# Patient Record
Sex: Female | Born: 1935 | Race: White | Hispanic: No | Smoking: Former smoker
Health system: Southern US, Community
[De-identification: ages and names within clinical notes are randomized; demographics above are authoritative.]

## PROBLEM LIST (undated history)

## (undated) DIAGNOSIS — I255 Ischemic cardiomyopathy: Secondary | ICD-10-CM

## (undated) DIAGNOSIS — E119 Type 2 diabetes mellitus without complications: Secondary | ICD-10-CM

## (undated) DIAGNOSIS — I5023 Acute on chronic systolic (congestive) heart failure: Secondary | ICD-10-CM

## (undated) DIAGNOSIS — I214 Non-ST elevation (NSTEMI) myocardial infarction: Secondary | ICD-10-CM

## (undated) DIAGNOSIS — E079 Disorder of thyroid, unspecified: Secondary | ICD-10-CM

## (undated) DIAGNOSIS — I2510 Atherosclerotic heart disease of native coronary artery without angina pectoris: Secondary | ICD-10-CM

## (undated) DIAGNOSIS — D649 Anemia, unspecified: Secondary | ICD-10-CM

## (undated) DIAGNOSIS — I10 Essential (primary) hypertension: Secondary | ICD-10-CM

## (undated) DIAGNOSIS — G459 Transient cerebral ischemic attack, unspecified: Secondary | ICD-10-CM

## (undated) HISTORY — DX: Non-ST elevation (NSTEMI) myocardial infarction: I21.4

## (undated) HISTORY — DX: Ischemic cardiomyopathy: I25.5

## (undated) HISTORY — DX: Atherosclerotic heart disease of native coronary artery without angina pectoris: I25.10

## (undated) HISTORY — DX: Acute on chronic systolic (congestive) heart failure: I50.23

---

## 2004-11-23 ENCOUNTER — Ambulatory Visit: Payer: Self-pay | Admitting: Internal Medicine

## 2005-01-24 ENCOUNTER — Ambulatory Visit: Payer: Self-pay | Admitting: Internal Medicine

## 2005-06-08 ENCOUNTER — Ambulatory Visit: Payer: Self-pay | Admitting: Internal Medicine

## 2006-01-24 ENCOUNTER — Ambulatory Visit: Payer: Self-pay | Admitting: Internal Medicine

## 2006-03-27 ENCOUNTER — Ambulatory Visit (HOSPITAL_COMMUNITY): Admission: RE | Admit: 2006-03-27 | Discharge: 2006-03-27 | Payer: Self-pay | Admitting: *Deleted

## 2006-04-10 ENCOUNTER — Ambulatory Visit: Payer: Self-pay | Admitting: *Deleted

## 2006-06-18 ENCOUNTER — Ambulatory Visit: Payer: Self-pay | Admitting: Physician Assistant

## 2007-01-28 ENCOUNTER — Ambulatory Visit: Payer: Self-pay | Admitting: Internal Medicine

## 2007-03-28 ENCOUNTER — Other Ambulatory Visit: Payer: Self-pay

## 2007-03-28 ENCOUNTER — Emergency Department: Payer: Self-pay | Admitting: Emergency Medicine

## 2008-01-29 ENCOUNTER — Ambulatory Visit: Payer: Self-pay | Admitting: Internal Medicine

## 2008-09-17 DIAGNOSIS — G459 Transient cerebral ischemic attack, unspecified: Secondary | ICD-10-CM

## 2008-09-17 HISTORY — DX: Transient cerebral ischemic attack, unspecified: G45.9

## 2009-03-02 ENCOUNTER — Ambulatory Visit: Payer: Self-pay | Admitting: Internal Medicine

## 2009-04-01 ENCOUNTER — Ambulatory Visit: Payer: Self-pay | Admitting: Unknown Physician Specialty

## 2010-01-10 ENCOUNTER — Inpatient Hospital Stay: Payer: Self-pay | Admitting: Internal Medicine

## 2010-01-16 ENCOUNTER — Emergency Department: Payer: Self-pay | Admitting: Emergency Medicine

## 2010-03-08 ENCOUNTER — Ambulatory Visit: Payer: Self-pay | Admitting: Internal Medicine

## 2011-03-13 ENCOUNTER — Ambulatory Visit: Payer: Self-pay | Admitting: Internal Medicine

## 2011-08-11 ENCOUNTER — Inpatient Hospital Stay: Payer: Self-pay | Admitting: Internal Medicine

## 2014-03-15 ENCOUNTER — Emergency Department: Payer: Self-pay | Admitting: Emergency Medicine

## 2014-04-17 DIAGNOSIS — I2510 Atherosclerotic heart disease of native coronary artery without angina pectoris: Secondary | ICD-10-CM

## 2014-04-17 HISTORY — DX: Atherosclerotic heart disease of native coronary artery without angina pectoris: I25.10

## 2014-05-02 ENCOUNTER — Inpatient Hospital Stay (HOSPITAL_COMMUNITY)
Admission: EM | Admit: 2014-05-02 | Discharge: 2014-05-05 | DRG: 246 | Disposition: A | Discharge status: Home Health | Payer: Medicare HMO | Attending: Cardiovascular Disease | Admitting: Cardiovascular Disease

## 2014-05-02 ENCOUNTER — Ambulatory Visit (HOSPITAL_COMMUNITY): Payer: Medicare HMO

## 2014-05-02 ENCOUNTER — Encounter (HOSPITAL_COMMUNITY): Payer: Self-pay | Admitting: Emergency Medicine

## 2014-05-02 DIAGNOSIS — E039 Hypothyroidism, unspecified: Secondary | ICD-10-CM | POA: Diagnosis present

## 2014-05-02 DIAGNOSIS — D649 Anemia, unspecified: Secondary | ICD-10-CM

## 2014-05-02 DIAGNOSIS — I5021 Acute systolic (congestive) heart failure: Secondary | ICD-10-CM | POA: Diagnosis present

## 2014-05-02 DIAGNOSIS — I501 Left ventricular failure: Secondary | ICD-10-CM | POA: Diagnosis present

## 2014-05-02 DIAGNOSIS — I509 Heart failure, unspecified: Secondary | ICD-10-CM | POA: Diagnosis present

## 2014-05-02 DIAGNOSIS — E119 Type 2 diabetes mellitus without complications: Secondary | ICD-10-CM | POA: Diagnosis present

## 2014-05-02 DIAGNOSIS — J811 Chronic pulmonary edema: Secondary | ICD-10-CM

## 2014-05-02 DIAGNOSIS — I2589 Other forms of chronic ischemic heart disease: Secondary | ICD-10-CM | POA: Diagnosis present

## 2014-05-02 DIAGNOSIS — I214 Non-ST elevation (NSTEMI) myocardial infarction: Secondary | ICD-10-CM | POA: Diagnosis present

## 2014-05-02 DIAGNOSIS — R079 Chest pain, unspecified: Secondary | ICD-10-CM | POA: Diagnosis present

## 2014-05-02 DIAGNOSIS — J9600 Acute respiratory failure, unspecified whether with hypoxia or hypercapnia: Secondary | ICD-10-CM | POA: Diagnosis present

## 2014-05-02 DIAGNOSIS — I10 Essential (primary) hypertension: Secondary | ICD-10-CM | POA: Diagnosis present

## 2014-05-02 DIAGNOSIS — J9601 Acute respiratory failure with hypoxia: Secondary | ICD-10-CM

## 2014-05-02 DIAGNOSIS — I2510 Atherosclerotic heart disease of native coronary artery without angina pectoris: Secondary | ICD-10-CM | POA: Diagnosis present

## 2014-05-02 DIAGNOSIS — M479 Spondylosis, unspecified: Secondary | ICD-10-CM | POA: Diagnosis present

## 2014-05-02 DIAGNOSIS — I059 Rheumatic mitral valve disease, unspecified: Secondary | ICD-10-CM

## 2014-05-02 DIAGNOSIS — Z955 Presence of coronary angioplasty implant and graft: Secondary | ICD-10-CM

## 2014-05-02 HISTORY — DX: Essential (primary) hypertension: I10

## 2014-05-02 HISTORY — DX: Anemia, unspecified: D64.9

## 2014-05-02 HISTORY — DX: Disorder of thyroid, unspecified: E07.9

## 2014-05-02 HISTORY — DX: Transient cerebral ischemic attack, unspecified: G45.9

## 2014-05-02 HISTORY — DX: Type 2 diabetes mellitus without complications: E11.9

## 2014-05-02 LAB — CBC
HCT: 37.6 % (ref 36.0–46.0)
HEMATOCRIT: 38.8 % (ref 36.0–46.0)
Hemoglobin: 12.4 g/dL (ref 12.0–15.0)
Hemoglobin: 11.6 g/dL — ABNORMAL LOW (ref 12.0–15.0)
MCH: 29 pg (ref 26.0–34.0)
MCH: 28.6 pg (ref 26.0–34.0)
MCHC: 32 g/dL (ref 30.0–36.0)
MCHC: 30.9 g/dL (ref 30.0–36.0)
MCV: 90.9 fL (ref 78.0–100.0)
MCV: 92.6 fL (ref 78.0–100.0)
PLATELETS: 353 10*3/uL (ref 150–400)
Platelets: 294 10*3/uL (ref 150–400)
RBC: 4.27 MIL/uL (ref 3.87–5.11)
RBC: 4.06 MIL/uL (ref 3.87–5.11)
RDW: 15 % (ref 11.5–15.5)
RDW: 15 % (ref 11.5–15.5)
WBC: 19.7 10*3/uL — ABNORMAL HIGH (ref 4.0–10.5)
WBC: 11.9 10*3/uL — ABNORMAL HIGH (ref 4.0–10.5)

## 2014-05-02 LAB — BASIC METABOLIC PANEL
Anion gap: 13 (ref 5–15)
Anion gap: 16 — ABNORMAL HIGH (ref 5–15)
BUN: 20 mg/dL (ref 6–23)
BUN: 19 mg/dL (ref 6–23)
CALCIUM: 9.8 mg/dL (ref 8.4–10.5)
CO2: 22 meq/L (ref 19–32)
CO2: 25 mEq/L (ref 19–32)
Calcium: 9.7 mg/dL (ref 8.4–10.5)
Chloride: 100 mEq/L (ref 96–112)
Chloride: 96 mEq/L (ref 96–112)
Creatinine, Ser: 1.04 mg/dL (ref 0.50–1.10)
Creatinine, Ser: 1.01 mg/dL (ref 0.50–1.10)
GFR calc Af Amer: 54 mL/min — ABNORMAL LOW (ref >90)
GFR calc non Af Amer: 45 mL/min — ABNORMAL LOW (ref >90)
GFR calc non Af Amer: 46 mL/min — ABNORMAL LOW (ref >90)
GFR, EST AFRICAN AMERICAN: 52 mL/min — AB (ref >90)
GLUCOSE: 216 mg/dL — AB (ref 70–99)
Glucose, Bld: 230 mg/dL — ABNORMAL HIGH (ref 70–99)
POTASSIUM: 5.3 meq/L (ref 3.7–5.3)
Potassium: 4.5 mEq/L (ref 3.7–5.3)
Sodium: 135 mEq/L — ABNORMAL LOW (ref 137–147)
Sodium: 137 mEq/L (ref 137–147)

## 2014-05-02 LAB — URINALYSIS, ROUTINE W REFLEX MICROSCOPIC
Bilirubin Urine: NEGATIVE
GLUCOSE, UA: NEGATIVE mg/dL
Hgb urine dipstick: NEGATIVE
Ketones, ur: NEGATIVE mg/dL
LEUKOCYTES UA: NEGATIVE
Nitrite: NEGATIVE
PH: 5.5 (ref 5.0–8.0)
Protein, ur: NEGATIVE mg/dL
SPECIFIC GRAVITY, URINE: 1.008 (ref 1.005–1.030)
Urobilinogen, UA: 0.2 mg/dL (ref 0.0–1.0)

## 2014-05-02 LAB — GLUCOSE, CAPILLARY
GLUCOSE-CAPILLARY: 130 mg/dL — AB (ref 70–99)
Glucose-Capillary: 163 mg/dL — ABNORMAL HIGH (ref 70–99)
Glucose-Capillary: 138 mg/dL — ABNORMAL HIGH (ref 70–99)
Glucose-Capillary: 202 mg/dL — ABNORMAL HIGH (ref 70–99)

## 2014-05-02 LAB — I-STAT TROPONIN, ED: TROPONIN I, POC: 0.45 ng/mL — AB (ref 0.00–0.08)

## 2014-05-02 LAB — LIPID PANEL
Cholesterol: 157 mg/dL (ref 0–200)
HDL: 50 mg/dL (ref >39)
LDL Cholesterol: 74 mg/dL (ref 0–99)
Total CHOL/HDL Ratio: 3.1 RATIO
Triglycerides: 164 mg/dL — ABNORMAL HIGH (ref <150)
VLDL: 33 mg/dL (ref 0–40)

## 2014-05-02 LAB — APTT: aPTT: 107 seconds — ABNORMAL HIGH (ref 24–37)

## 2014-05-02 LAB — PROTIME-INR
INR: 1.04 (ref 0.00–1.49)
PROTHROMBIN TIME: 13.6 s (ref 11.6–15.2)

## 2014-05-02 LAB — HEPARIN LEVEL (UNFRACTIONATED)
Heparin Unfractionated: 0.32 IU/mL (ref 0.30–0.70)
Heparin Unfractionated: 0.29 IU/mL — ABNORMAL LOW (ref 0.30–0.70)

## 2014-05-02 LAB — TROPONIN I
TROPONIN I: 3.69 ng/mL — AB (ref <0.30)
TROPONIN I: 4.63 ng/mL — AB (ref <0.30)
Troponin I: 3.95 ng/mL (ref <0.30)

## 2014-05-02 LAB — MRSA PCR SCREENING: MRSA BY PCR: NEGATIVE

## 2014-05-02 LAB — PRO B NATRIURETIC PEPTIDE: PRO B NATRI PEPTIDE: 4882 pg/mL — AB (ref 0–450)

## 2014-05-02 SURGERY — Surgical Case
Anesthesia: *Unknown

## 2014-05-02 MED ORDER — ATORVASTATIN CALCIUM 40 MG PO TABS
40.0000 mg | ORAL_TABLET | Freq: Every day | ORAL | Status: DC
Start: 2014-05-02 — End: 2014-05-05
  Administered 2014-05-02 – 2014-05-03 (×2): 40 mg via ORAL
  Filled 2014-05-02 (×5): qty 1

## 2014-05-02 MED ORDER — FUROSEMIDE 10 MG/ML IJ SOLN: 40.0000 mg | Freq: Once | INTRAMUSCULAR | Status: AC

## 2014-05-02 MED ORDER — HEPARIN (PORCINE) IN NACL 100-0.45 UNIT/ML-% IJ SOLN
950.0000 [IU]/h | INTRAMUSCULAR | Status: DC
  Administered 2014-05-02: 950 [IU]/h via INTRAVENOUS
  Filled 2014-05-02 (×2): qty 250

## 2014-05-02 MED ORDER — LEVOTHYROXINE SODIUM 150 MCG PO TABS
150.0000 ug | ORAL_TABLET | Freq: Every day | ORAL | Status: DC
  Administered 2014-05-02 – 2014-05-05 (×4): 150 ug via ORAL
  Filled 2014-05-02 (×7): qty 1

## 2014-05-02 MED ORDER — ACETAMINOPHEN 325 MG PO TABS
650.0000 mg | ORAL_TABLET | ORAL | Status: DC | PRN
  Administered 2014-05-02: 650 mg via ORAL
  Filled 2014-05-02: qty 2

## 2014-05-02 MED ORDER — INSULIN ASPART 100 UNIT/ML ~~LOC~~ SOLN
0.0000 [IU] | Freq: Three times a day (TID) | SUBCUTANEOUS | Status: DC
  Administered 2014-05-02: 2 [IU] via SUBCUTANEOUS
  Administered 2014-05-02: 3 [IU] via SUBCUTANEOUS
  Administered 2014-05-02: 2 [IU] via SUBCUTANEOUS
  Administered 2014-05-03: 3 [IU] via SUBCUTANEOUS
  Administered 2014-05-03: 2 [IU] via SUBCUTANEOUS
  Administered 2014-05-04: 3 [IU] via SUBCUTANEOUS

## 2014-05-02 MED ORDER — ONDANSETRON HCL 4 MG/2ML IJ SOLN
4.0000 mg | Freq: Four times a day (QID) | INTRAMUSCULAR | Status: DC | PRN
Start: 2014-05-02 — End: 2014-05-05

## 2014-05-02 MED ORDER — HEPARIN (PORCINE) IN NACL 100-0.45 UNIT/ML-% IJ SOLN
1050.0000 [IU]/h | INTRAMUSCULAR | Status: DC
  Administered 2014-05-02: 1050 [IU]/h via INTRAVENOUS
  Filled 2014-05-02 (×2): qty 250

## 2014-05-02 MED ORDER — NITROGLYCERIN IN D5W 200-5 MCG/ML-% IV SOLN
2.0000 ug/min | Freq: Once | INTRAVENOUS | Status: AC
  Administered 2014-05-02: 15 ug/min via INTRAVENOUS

## 2014-05-02 MED ORDER — CLOPIDOGREL BISULFATE 75 MG PO TABS
75.0000 mg | ORAL_TABLET | Freq: Every day | ORAL | Status: DC
  Administered 2014-05-02 – 2014-05-05 (×4): 75 mg via ORAL
  Filled 2014-05-02 (×5): qty 1

## 2014-05-02 MED ORDER — FUROSEMIDE 10 MG/ML IJ SOLN
80.0000 mg | Freq: Once | INTRAMUSCULAR | Status: AC
  Administered 2014-05-02: 80 mg via INTRAVENOUS

## 2014-05-02 MED ORDER — METOPROLOL TARTRATE 12.5 MG HALF TABLET
12.5000 mg | ORAL_TABLET | Freq: Two times a day (BID) | ORAL | Status: DC
  Administered 2014-05-02 – 2014-05-04 (×6): 12.5 mg via ORAL
  Filled 2014-05-02 (×10): qty 1

## 2014-05-02 MED ORDER — NITROGLYCERIN IN D5W 200-5 MCG/ML-% IV SOLN
3.0000 ug/min | INTRAVENOUS | Status: DC
  Administered 2014-05-03: 25 ug/min via INTRAVENOUS
  Filled 2014-05-02: qty 250

## 2014-05-02 MED ORDER — HEPARIN BOLUS VIA INFUSION
4000.0000 [IU] | Freq: Once | INTRAVENOUS | Status: AC
  Administered 2014-05-02: 4000 [IU] via INTRAVENOUS
  Filled 2014-05-02: qty 4000

## 2014-05-02 MED ORDER — SODIUM CHLORIDE 0.9 % IV SOLN
Freq: Once | INTRAVENOUS | Status: AC
  Administered 2014-05-02: 04:00:00 via INTRAVENOUS

## 2014-05-02 MED ORDER — ASPIRIN EC 81 MG PO TBEC
81.0000 mg | DELAYED_RELEASE_TABLET | Freq: Every day | ORAL | Status: DC
  Administered 2014-05-03 – 2014-05-05 (×3): 81 mg via ORAL
  Filled 2014-05-02 (×3): qty 1

## 2014-05-02 MED ORDER — NITROGLYCERIN 0.4 MG SL SUBL: 0.4000 mg | SUBLINGUAL_TABLET | SUBLINGUAL | Status: DC | PRN

## 2014-05-02 NOTE — Progress Notes (Signed)
ANTICOAGULATION CONSULT NOTE - Follow Up Consult  Pharmacy Consult for Heparin Indication: chest pain/ACS, NSTEMI  Allergies  Allergen Reactions  . Clonidine Derivatives Other (See Comments)    Maker her pass out  . Penicillins     Patient Measurements: Height: 5\' 2"  (157.5 cm) Weight: 173 lb 8 oz (78.7 kg) IBW/kg (Calculated) : 50.1 Heparin Dosing Weight: 68 kg  Vital Signs: Temp: 98.2 F (36.8 C) (08/16 1604) Temp src: Oral (08/16 1604) BP: 126/81 mmHg (08/16 1800) Pulse Rate: 69 (08/16 1800)  Labs:  Recent Labs  05/02/14 0301 05/02/14 0615 05/02/14 0920 05/02/14 1145 05/02/14 1831  HGB 12.4  --  11.6*  --   --   HCT 38.8  --  37.6  --   --   PLT 353  --  294  --   --   APTT  --   --  107*  --   --   LABPROT  --   --  13.6  --   --   INR  --   --  1.04  --   --   HEPARINUNFRC  --   --   --  0.32 0.29*  CREATININE 1.04  --  1.01  --   --   TROPONINI  --  3.69*  --  4.63*  --     Estimated Creatinine Clearance: 33.8 ml/min (by C-G formula based on Cr of 1.01).   Medications:  Infusions:  . heparin 9.5 Units (05/02/14 1600)  . nitroGLYCERIN 25 mcg/min (05/02/14 1600)    Assessment: 80 yo F on heparin for NSTEMI.  Heparin level came back slightly low.  No bleeding noted.  Noted plans for cardiac cath tomorrow.  Goal of Therapy:  Heparin level 0.3-0.7 units/ml Monitor platelets by anticoagulation protocol: Yes   Plan:   Increase heparin to 1050 units/hr F/u with level in AM

## 2014-05-02 NOTE — Progress Notes (Signed)
Utilization Review Completed.Lang Zingg T8/16/2015  

## 2014-05-02 NOTE — ED Notes (Signed)
Cardiology at bedside.

## 2014-05-02 NOTE — Progress Notes (Signed)
ANTICOAGULATION CONSULT NOTE - Follow Up Consult  Pharmacy Consult for Heparin Indication: chest pain/ACS, NSTEMI  Allergies  Allergen Reactions  . Clonidine Derivatives Other (See Comments)    Maker her pass out  . Penicillins     Patient Measurements: Height: 5\' 2"  (157.5 cm) Weight: 173 lb 8 oz (78.7 kg) IBW/kg (Calculated) : 50.1 Heparin Dosing Weight: 68 kg  Vital Signs: Temp: 98.2 F (36.8 C) (08/16 1146) Temp src: Oral (08/16 1146) BP: 151/49 mmHg (08/16 1300) Pulse Rate: 73 (08/16 1300)  Labs:  Recent Labs  05/02/14 0301 05/02/14 0615 05/02/14 0920 05/02/14 1145  HGB 12.4  --  11.6*  --   HCT 38.8  --  37.6  --   PLT 353  --  294  --   APTT  --   --  107*  --   LABPROT  --   --  13.6  --   INR  --   --  1.04  --   HEPARINUNFRC  --   --   --  0.32  CREATININE 1.04  --  1.01  --   TROPONINI  --  3.69*  --  4.63*    Estimated Creatinine Clearance: 33.8 ml/min (by C-G formula based on Cr of 1.01).   Medications:  Infusions:  . heparin 9.5 Units (05/02/14 1300)  . nitroGLYCERIN 25 mcg/min (05/02/14 1300)    Assessment: 80 yo F on heparin for NSTEMI.  Heparin level is therapeutic on current rate.  No bleeding noted.  Noted plans for cardiac cath tomorrow.  Goal of Therapy:  Heparin level 0.3-0.7 units/ml Monitor platelets by anticoagulation protocol: Yes   Plan:  1. Continue heparin gtt at 950 units/hr.  2. Check heparin level 6 hrs for confirmation. 3. Daily heparin level and CBC. 4. F/u cath 8/17 5. F/u ECHO   [employer], Pharm.D., BCPS Clinical Pharmacist Pager [phone] 05/02/2014 1:59 PM

## 2014-05-02 NOTE — ED Notes (Addendum)
Per EMS: pt coming from home with c/o sudden onset of chest pain. Upon EMS arrival pt denies chest pain. Pt was given 81 mg asa x 4. No nitro. Pt denies chest pain at this time. Pt had a sudden onset of shortness of breath en route. Pt placed on CPAP. Pt A&Ox4, respirations equal and labored, skin warm and dry

## 2014-05-02 NOTE — Progress Notes (Addendum)
ANTICOAGULATION CONSULT NOTE - Initial Consult  Pharmacy Consult for heparin Indication: chest pain/ACS  Allergies  Allergen Reactions  . Clonidine Derivatives Other (See Comments)    Maker her pass out  . Penicillins     Patient Measurements: Height: 5\' 2"  (157.5 cm) Weight: 175 lb (79.379 kg) IBW/kg (Calculated) : 50.1 Heparin Dosing Weight: ~ 68 kg  Vital Signs: Temp: 98.4 F (36.9 C) (08/16 0600) Temp src: Oral (08/16 0600) BP: 131/87 mmHg (08/16 0700) Pulse Rate: 70 (08/16 0700)  Labs:  Recent Labs  05/02/14 0301 05/02/14 0615  HGB 12.4  --   HCT 38.8  --   PLT 353  --   CREATININE 1.04  --   TROPONINI  --  3.69*    Estimated Creatinine Clearance: 33 ml/min (by C-G formula based on Cr of 1.04).   Medical History: Past Medical History  Diagnosis Date  . TIA (transient ischemic attack) 2010  . Diabetes mellitus without complication   . Hypertension   . Thyroid disease   . Anemia     Medications:  Scheduled:  . [START ON 05/03/2014] aspirin EC  81 mg Oral Daily  . atorvastatin  40 mg Oral q1800  . clopidogrel  75 mg Oral Q breakfast  . furosemide  40 mg Intravenous Once  . metoprolol tartrate  12.5 mg Oral BID    Assessment: 80 yo female admitted with chest pain and hypertension.  Pharmacy asked to begin IV heparin for NSTEMI.  No anticoagulants noted PTA.  No history of bleeding noted.  Goal of Therapy:  Heparin level 0.3-0.7 units/ml Monitor platelets by anticoagulation protocol: Yes   Plan:  1. Heparin bolus of 4000 units IV x 1 (already completed this AM) 2. Then start heparin gtt at 950 units/hr (already started at 0345 AM) 3. Check heparin level 8 hrs after gtt starts. 4. Daily heparin level and CBC. 5. F/u plans for cath lab or further cardiac workup.  Tad Moore, BCPS  Clinical Pharmacist Pager [phone]  05/02/2014 7:40 AM

## 2014-05-02 NOTE — ED Provider Notes (Addendum)
CSN: [id number]     Arrival date & time 05/02/14  0252 History   None    Chief Complaint  Patient presents with  . Code STEMI     (Consider location/radiation/quality/duration/timing/severity/associated sxs/prior Treatment) HPI This patient is a  80 yo woman with multiple chronic medical problems [place] [address] [facility] [place] with report of chest pain. When paramedics arrived at her house she was actually pain free. She had awakened from sleep with chest pain which was midline, nonradiating, tight feeling and moderately severe.   Patient developed respiratory distress with bibasilar rales en route and sats dropped to the mid 80s. Patient was started on BiPAP by paramedics. No meds administered. Patient denies history of cardiomyopathy and CAD.   Patient's EKG, per EMS, was concerning for inferior STEMI so, Code Stemi was initiated.   No past medical history on file. No past surgical history on file. No family history on file. History  Substance Use Topics  . Smoking status: Not on file  . Smokeless tobacco: Not on file  . Alcohol Use: Not on file   OB History   No data available     Review of Systems 10 point review of symptoms obtained and is negative with the exceptions of symptoms noted abov.e     Allergies  Review of patient's allergies indicates not on file.  Home Medications   Prior to Admission medications   Not on File   BP 191/91  Pulse 98  Temp(Src) 97.6 F (36.4 C) (Oral)  Resp 32  Ht 5\' 2"  (1.575 m)  Wt 175 lb (79.379 kg)  BMI 32.00 kg/m2  SpO2 86% Physical Exam  Gen: well nourished and well developed appearing, appears to be in respiratory distress Head: NCAT Ears: normal to inspection Nose: normal to inspection, no epistaxis or drainage Mouth: oral mucsoa is well hydrated appearing, normal posterior oropharynx Neck: supple, no stridor, + JVD CV: RRR, no murmur, palpable peripheral pulses Resp: RR 32 to 36/min with abdominal muscle  use, bibasilar rales, increased respiratory effort.  Abd: soft, nontender, nondistended, obese Extremities: normal to inspection.  Skin: warm and dry Neuro: CN ii - XII, no focal deficitis Psyche; normal affect, cooperative.   ED Course  Procedures (including critical care time) Labs Review = Results for orders placed during the hospital encounter of 05/02/14 (from the past 24 hour(s))  CBC     Status: Abnormal   Collection Time    05/02/14  3:01 AM      Result Value Ref Range   WBC 19.7 (*) 4.0 - 10.5 K/uL   RBC 4.27  3.87 - 5.11 MIL/uL   Hemoglobin 12.4  12.0 - 15.0 g/dL   HCT 13.238.8  44.036.0 - 10.246.0 %   MCV 90.9  78.0 - 100.0 fL   MCH 29.0  26.0 - 34.0 pg   MCHC 32.0  30.0 - 36.0 g/dL   RDW 72.515.0  36.611.5 - 44.015.5 %   Platelets 353  150 - 400 K/uL  BASIC METABOLIC PANEL     Status: Abnormal   Collection Time    05/02/14  3:01 AM      Result Value Ref Range   Sodium 135 (*) 137 - 147 mEq/L   Potassium 5.3  3.7 - 5.3 mEq/L   Chloride 100  96 - 112 mEq/L   CO2 22  19 - 32 mEq/L   Glucose, Bld 230 (*) 70 - 99 mg/dL   BUN 20  6 -  23 mg/dL   Creatinine, Ser 8.34  0.50 - 1.10 mg/dL   Calcium 9.7  8.4 - 37.3 mg/dL   GFR calc non Af Amer 45 (*) >90 mL/min   GFR calc Af Amer 52 (*) >90 mL/min   Anion gap 13  5 - 15  PRO B NATRIURETIC PEPTIDE     Status: Abnormal   Collection Time    05/02/14  3:01 AM      Result Value Ref Range   Pro B Natriuretic peptide (BNP) 4882.0 (*) 0 - 450 pg/mL  I-STAT TROPOININ, ED     Status: Abnormal   Collection Time    05/02/14  3:18 AM      Result Value Ref Range   Troponin i, poc 0.45 (*) 0.00 - 0.08 ng/mL   Comment NOTIFIED PHYSICIAN     Comment 3            CXR: pulmonary edema.   EKG:  nsr, no acute ischemic changes, normal intervals, normal axis, normal qrs complex  CRITICAL CARE Performed by: Brandt Loosen   Total critical care time: 83m  Critical care time was exclusive of separately billable procedures and treating other  patients.  Critical care was necessary to treat or prevent imminent or life-threatening deterioration.  Critical care was time spent personally by me on the following activities: development of treatment plan with patient and/or surrogate as well as nursing, discussions with consultants, evaluation of patient's response to treatment, examination of patient, obtaining history from patient or surrogate, ordering and performing treatments and interventions, ordering and review of laboratory studies, ordering and review of radiographic studies, pulse oximetry and re-evaluation of patient's condition.   MDM   Patient with acute NSTEMI and flash pulmonary edema with hypertensive urgency. We are treating with IV lasix, IV NTG, ASA and heparin along with continuation of BiPAP. Patient will need to come in to a step down unit.     Brandt Loosen, MD 05/02/14 5789  Brandt Loosen, MD 05/02/14 [id number]  Case discussed with Cardiology Fellow on call. He will see and evaluate the patient for admission. Patient re-evaluated at 0410 and remains pain free. Tachypnea has imp;roved. We will place Foley cath to monitor UOP.   Brandt Loosen, MD 05/02/14 [id number]

## 2014-05-02 NOTE — H&P (Signed)
Melinda Lewis is an 80 y.o. female.   Chief Complaint: Chest pain  HPI:  Melinda Lewis is a 80 year old female with history of type 2 diabetes, hypothyroidism and hypertension. She presents today with chest pain and shortness of breath. The pain was substernal pressure like. It was severe. It was associated with shortness of breath. It did not occur with exertion. It was not improved with rest. She had previous symptoms about 1 month ago that went away after 2 hours. She did not have any change in her health since then. She recently had her synthroid increase to 161mg. She called EMS when her symptoms did not improve. In the ambulance she became hypoxic and was started on non-rebreather. In the ED here she was profoundly hypertensive while rales bilaterally. She was given lasix, heparin, BiPap and nitroglycerin drip. Her blood pressure has improved and her hypoxia improved. Despite her age she is active at home. She is fully independent. She has severe OA of the back which limits her activities. She doesn't recall any exertional angina or shortness of breath.   No past medical history on file. PMHX Hypothyroid Hypertension Diabetes  No past surgical history on file.  No family history on file. Social History:  has no tobacco, alcohol, and drug history on file. Non smoker  Family history- no premature CAD or sudden cardiac death  Allergies: Not on File   (Not in a hospital admission)  Results for orders placed during the hospital encounter of 05/02/14 (from the past 48 hour(s))  CBC     Status: Abnormal   Collection Time    05/02/14  3:01 AM      Result Value Ref Range   WBC 19.7 (*) 4.0 - 10.5 K/uL   RBC 4.27  3.87 - 5.11 MIL/uL   Hemoglobin 12.4  12.0 - 15.0 g/dL   HCT 38.8  36.0 - 46.0 %   MCV 90.9  78.0 - 100.0 fL   MCH 29.0  26.0 - 34.0 pg   MCHC 32.0  30.0 - 36.0 g/dL   RDW 15.0  11.5 - 15.5 %   Platelets 353  150 - 400 K/uL  BASIC METABOLIC PANEL     Status: Abnormal    Collection Time    05/02/14  3:01 AM      Result Value Ref Range   Sodium 135 (*) 137 - 147 mEq/L   Potassium 5.3  3.7 - 5.3 mEq/L   Chloride 100  96 - 112 mEq/L   CO2 22  19 - 32 mEq/L   Glucose, Bld 230 (*) 70 - 99 mg/dL   BUN 20  6 - 23 mg/dL   Creatinine, Ser 1.04  0.50 - 1.10 mg/dL   Calcium 9.7  8.4 - 10.5 mg/dL   GFR calc non Af Amer 45 (*) >90 mL/min   GFR calc Af Amer 52 (*) >90 mL/min   Comment: (NOTE)     The eGFR has been calculated using the CKD EPI equation.     This calculation has not been validated in all clinical situations.     eGFR's persistently <90 mL/min signify possible Chronic Kidney     Disease.   Anion gap 13  5 - 15  PRO B NATRIURETIC PEPTIDE     Status: Abnormal   Collection Time    05/02/14  3:01 AM      Result Value Ref Range   Pro B Natriuretic peptide (BNP) 4882.0 (*) 0 - 450  pg/mL  Randolm Idol, ED     Status: Abnormal   Collection Time    05/02/14  3:18 AM      Result Value Ref Range   Troponin i, poc 0.45 (*) 0.00 - 0.08 ng/mL   Comment NOTIFIED PHYSICIAN     Comment 3            Comment: Due to the release kinetics of cTnI,     a negative result within the first hours     of the onset of symptoms does not rule out     myocardial infarction with certainty.     If myocardial infarction is still suspected,     repeat the test at appropriate intervals.   Dg Chest Port 1 View  05/02/2014   CLINICAL DATA:  Code ST-elevation myocardial infarction.  EXAM: PORTABLE CHEST - 1 VIEW  COMPARISON:  None.  FINDINGS: The lungs are well-aerated. Vascular congestion is noted. Increased interstitial markings raise concern for mild interstitial edema. No pleural effusion or pneumothorax is seen.  The cardiomediastinal silhouette is borderline normal in size. No acute osseous abnormalities are seen. There is chronic deformity of both humeral heads, worse on the left.  IMPRESSION: Vascular congestion noted. Increased interstitial markings raise concern for  mild interstitial edema.   Electronically Signed   By: Garald Balding M.D.   On: 05/02/2014 03:54    Review of Systems  Constitutional: Negative.   Respiratory: Positive for shortness of breath.   Cardiovascular: Positive for chest pain.  All other systems reviewed and are negative.   Blood pressure 92/44, pulse 69, temperature 97.6 F (36.4 C), temperature source Oral, resp. rate 17, height 5' 2"  (1.575 m), weight 175 lb (79.379 kg), SpO2 98.00%. Physical Exam  Vitals reviewed. Constitutional: She is oriented to person, place, and time. No distress.  Eyes: Pupils are equal, round, and reactive to light.  Neck: JVD present.  Cardiovascular: Normal rate and regular rhythm.   Respiratory: She has rales.  GI: Soft. Bowel sounds are normal.  Musculoskeletal: Normal range of motion. She exhibits no edema.  Neurological: She is alert and oriented to person, place, and time.  Skin: Skin is warm and dry.     Assessment/Plan Melinda Lewis presents with NSTEMI and pulmonary edema. She was also profoundly hypertensive. It is possible that she had an infarct a month ago with her initial symptoms and now just has an exacerbation of heart failure. We won't know until we collect more data.   NSTEMI- Currently pain free. Continue aspirin, heparin drip, statin and nitrates. Will trend cardiac biomarkers and get an echo. Will likely need coronary angiogram  Pulmonary edema- lasix 63m IV, BiPAP  Hypoxic respiratory failure- improving with NIPPV and lasix. Will try to wean to nasal cannula.   Hypertension- improving. Will start metoprolol for BP control  Diabetes type 2- Hold metformin while inpatient  Hypothyroid- continue home synthroid  Full Code  Melinda Lewis C 05/02/2014, 4:11 AM

## 2014-05-02 NOTE — Progress Notes (Signed)
  Echocardiogram 2D Echocardiogram has been performed.  Melinda Lewis 05/02/2014, 3:15 PM

## 2014-05-02 NOTE — Progress Notes (Signed)
Pt taken off Bipap at this time and placed on 2L [place] sats 100%. BBS clear, diminished. Pt in no distress. Will continue to monitor.

## 2014-05-02 NOTE — Progress Notes (Signed)
Chest pain is intermittent, currently relieved. Breathing back to baseline. Troponin increased to 3.6. Plan cardiac cath +/-PCI in AM  Thurmon Fair, MD, [facility] [phone] office [phone] pager

## 2014-05-03 ENCOUNTER — Encounter (HOSPITAL_COMMUNITY)
Admission: EM | Disposition: A | Discharge status: Home Health | Payer: Commercial Managed Care - HMO | Source: Home / Self Care | Attending: Cardiovascular Disease

## 2014-05-03 DIAGNOSIS — I214 Non-ST elevation (NSTEMI) myocardial infarction: Secondary | ICD-10-CM

## 2014-05-03 DIAGNOSIS — I2510 Atherosclerotic heart disease of native coronary artery without angina pectoris: Secondary | ICD-10-CM

## 2014-05-03 HISTORY — PX: PERCUTANEOUS CORONARY STENT INTERVENTION (PCI-S): SHX5485

## 2014-05-03 HISTORY — PX: LEFT HEART CATHETERIZATION WITH CORONARY ANGIOGRAM: SHX5451

## 2014-05-03 LAB — GLUCOSE, CAPILLARY
GLUCOSE-CAPILLARY: 122 mg/dL — AB (ref 70–99)
GLUCOSE-CAPILLARY: 224 mg/dL — AB (ref 70–99)
Glucose-Capillary: 179 mg/dL — ABNORMAL HIGH (ref 70–99)
Glucose-Capillary: 114 mg/dL — ABNORMAL HIGH (ref 70–99)

## 2014-05-03 LAB — CBC
HCT: 36.6 % (ref 36.0–46.0)
HEMATOCRIT: 32 % — AB (ref 36.0–46.0)
HEMOGLOBIN: 10.2 g/dL — AB (ref 12.0–15.0)
Hemoglobin: 11.8 g/dL — ABNORMAL LOW (ref 12.0–15.0)
MCH: 28.7 pg (ref 26.0–34.0)
MCH: 28.9 pg (ref 26.0–34.0)
MCHC: 31.9 g/dL (ref 30.0–36.0)
MCHC: 32.2 g/dL (ref 30.0–36.0)
MCV: 89.9 fL (ref 78.0–100.0)
MCV: 89.5 fL (ref 78.0–100.0)
PLATELETS: 262 10*3/uL (ref 150–400)
PLATELETS: 274 10*3/uL (ref 150–400)
RBC: 3.56 MIL/uL — AB (ref 3.87–5.11)
RBC: 4.09 MIL/uL (ref 3.87–5.11)
RDW: 14.7 % (ref 11.5–15.5)
RDW: 14.7 % (ref 11.5–15.5)
WBC: 11.1 10*3/uL — AB (ref 4.0–10.5)
WBC: 12.1 10*3/uL — ABNORMAL HIGH (ref 4.0–10.5)

## 2014-05-03 LAB — CREATININE, SERUM
Creatinine, Ser: 0.86 mg/dL (ref 0.50–1.10)
GFR calc Af Amer: 65 mL/min — ABNORMAL LOW (ref >90)
GFR calc non Af Amer: 56 mL/min — ABNORMAL LOW (ref >90)

## 2014-05-03 LAB — HEPARIN LEVEL (UNFRACTIONATED): Heparin Unfractionated: 0.47 IU/mL (ref 0.30–0.70)

## 2014-05-03 LAB — POCT ACTIVATED CLOTTING TIME: Activated Clotting Time: 687 seconds

## 2014-05-03 SURGERY — LEFT HEART CATHETERIZATION WITH CORONARY ANGIOGRAM
Anesthesia: LOCAL | Laterality: Right

## 2014-05-03 MED ORDER — FUROSEMIDE 10 MG/ML IJ SOLN
40.0000 mg | Freq: Once | INTRAMUSCULAR | Status: AC
  Administered 2014-05-03: 40 mg via INTRAVENOUS
  Filled 2014-05-03: qty 4

## 2014-05-03 MED ORDER — HEPARIN (PORCINE) IN NACL 2-0.9 UNIT/ML-% IJ SOLN
INTRAMUSCULAR | Status: AC
  Filled 2014-05-03: qty 1000

## 2014-05-03 MED ORDER — SODIUM CHLORIDE 0.9 % IJ SOLN
3.0000 mL | Freq: Two times a day (BID) | INTRAMUSCULAR | Status: DC
  Administered 2014-05-03: 3 mL via INTRAVENOUS

## 2014-05-03 MED ORDER — FENTANYL CITRATE 0.05 MG/ML IJ SOLN
INTRAMUSCULAR | Status: AC
  Filled 2014-05-03: qty 2

## 2014-05-03 MED ORDER — HEPARIN SODIUM (PORCINE) 5000 UNIT/ML IJ SOLN
5000.0000 [IU] | Freq: Three times a day (TID) | INTRAMUSCULAR | Status: DC
  Administered 2014-05-04 (×2): 5000 [IU] via SUBCUTANEOUS
  Filled 2014-05-03 (×4): qty 1

## 2014-05-03 MED ORDER — LISINOPRIL 5 MG PO TABS
5.0000 mg | ORAL_TABLET | Freq: Every day | ORAL | Status: DC
  Administered 2014-05-03 – 2014-05-05 (×3): 5 mg via ORAL
  Filled 2014-05-03 (×3): qty 1

## 2014-05-03 MED ORDER — BIVALIRUDIN 250 MG IV SOLR
INTRAVENOUS | Status: AC
  Filled 2014-05-03: qty 250

## 2014-05-03 MED ORDER — ASPIRIN 81 MG PO CHEW: 81.0000 mg | CHEWABLE_TABLET | ORAL | Status: DC

## 2014-05-03 MED ORDER — SODIUM CHLORIDE 0.9 % IV SOLN
1.0000 mL/kg/h | INTRAVENOUS | Status: DC
  Administered 2014-05-03: 1 mL/kg/h via INTRAVENOUS

## 2014-05-03 MED ORDER — ALBUTEROL SULFATE (2.5 MG/3ML) 0.083% IN NEBU
2.5000 mg | INHALATION_SOLUTION | Freq: Four times a day (QID) | RESPIRATORY_TRACT | Status: DC
  Administered 2014-05-03 – 2014-05-04 (×6): 2.5 mg via RESPIRATORY_TRACT
  Filled 2014-05-03 (×6): qty 3

## 2014-05-03 MED ORDER — CETYLPYRIDINIUM CHLORIDE 0.05 % MT LIQD
7.0000 mL | Freq: Two times a day (BID) | OROMUCOSAL | Status: DC
  Administered 2014-05-03 – 2014-05-04 (×4): 7 mL via OROMUCOSAL

## 2014-05-03 MED ORDER — SODIUM CHLORIDE 0.9 % IV SOLN: 250.0000 mL | INTRAVENOUS | Status: DC | PRN

## 2014-05-03 MED ORDER — LIDOCAINE HCL (PF) 1 % IJ SOLN
INTRAMUSCULAR | Status: AC
  Filled 2014-05-03: qty 30

## 2014-05-03 MED ORDER — VERAPAMIL HCL 2.5 MG/ML IV SOLN
INTRAVENOUS | Status: AC
  Filled 2014-05-03: qty 2

## 2014-05-03 MED ORDER — MIDAZOLAM HCL 2 MG/2ML IJ SOLN
INTRAMUSCULAR | Status: AC
  Filled 2014-05-03: qty 2

## 2014-05-03 MED ORDER — SODIUM CHLORIDE 0.9 % IJ SOLN: 3.0000 mL | INTRAMUSCULAR | Status: DC | PRN

## 2014-05-03 MED ORDER — NITROGLYCERIN 1 MG/10 ML FOR IR/CATH LAB
INTRA_ARTERIAL | Status: AC
  Filled 2014-05-03: qty 10

## 2014-05-03 MED ORDER — HEPARIN SODIUM (PORCINE) 1000 UNIT/ML IJ SOLN
INTRAMUSCULAR | Status: AC
  Filled 2014-05-03: qty 1

## 2014-05-03 MED ORDER — CLOPIDOGREL BISULFATE 300 MG PO TABS
ORAL_TABLET | ORAL | Status: AC
  Filled 2014-05-03: qty 1

## 2014-05-03 NOTE — H&P (View-Only) (Signed)
Patient Name: Melinda Lewis Date of Encounter: 05/03/2014  Active Problems:   NSTEMI (non-ST elevated myocardial infarction)   Hypertension   Pulmonary edema cardiac cause   Length of Stay: 1  SUBJECTIVE  Chest pain free this am.  CURRENT MEDS . [START ON 05/04/2014] aspirin  81 mg Oral Pre-Cath  . aspirin EC  81 mg Oral Daily  . atorvastatin  40 mg Oral q1800  . clopidogrel  75 mg Oral Q breakfast  . insulin aspart  0-15 Units Subcutaneous TID WC  . levothyroxine  150 mcg Oral QAC breakfast  . metoprolol tartrate  12.5 mg Oral BID  . sodium chloride  3 mL Intravenous Q12H   . heparin 1,050 Units/hr (05/03/14 0800)  . nitroGLYCERIN 25 mcg/min (05/03/14 0800)   OBJECTIVE  Filed Vitals:   05/03/14 0600 05/03/14 0707 05/03/14 0809 05/03/14 0941  BP: 154/59  153/53   Pulse: 70  81 86  Temp:   97.5 F (36.4 C)   TempSrc:   Oral   Resp: 14  19   Height:      Weight:  173 lb 4.5 oz (78.6 kg)    SpO2: 97%  99%     Intake/Output Summary (Last 24 hours) at 05/03/14 1053 Last data filed at 05/03/14 0942  Gross per 24 hour  Intake 1250.31 ml  Output   1735 ml  Net -484.69 ml   Filed Weights   05/02/14 0730 05/03/14 0405 05/03/14 0707  Weight: 173 lb 8 oz (78.7 kg) 173 lb 4.5 oz (78.6 kg) 173 lb 4.5 oz (78.6 kg)   PHYSICAL EXAM  General: Pleasant, NAD. Neuro: Alert and oriented X 3. Moves all extremities spontaneously. Psych: Normal affect. HEENT:  Normal  Neck: Supple without bruits or JVD. Lungs:  Resp regular and unlabored, end-expiratory wheezes at the bases Heart: RRR no s3, s4, or murmurs. Abdomen: Soft, non-tender, non-distended, BS + x 4.  Extremities: No clubbing, cyanosis or edema. DP/PT/Radials 2+ and equal bilaterally.  Accessory Clinical Findings  CBC  Recent Labs  05/02/14 0920 05/03/14 0255  WBC 11.9* 11.1*  HGB 11.6* 10.2*  HCT 37.6 32.0*  MCV 92.6 89.9  PLT 294 262   Basic Metabolic Panel  Recent Labs  05/02/14 0301  05/02/14 0920  NA 135* 137  K 5.3 4.5  CL 100 96  CO2 22 25  GLUCOSE 230* 216*  BUN 20 19  CREATININE 1.04 1.01  CALCIUM 9.7 9.8    Recent Labs  05/02/14 0615 05/02/14 1145 05/02/14 1831  TROPONINI 3.69* 4.63* 3.95*    Recent Labs  05/02/14 0920  CHOL 157  HDL 50  LDLCALC 74  TRIG 164*  CHOLHDL 3.1   Radiology/Studies  Dg Chest Port 1 View  05/02/2014   CLINICAL DATA:  Code ST-elevation myocardial infarction.    IMPRESSION: Vascular congestion noted. Increased interstitial markings raise concern for mild interstitial edema.     ECHO: 05/02/2014 - Left ventricle: The cavity size was normal. Wall thickness was normal. Systolic function was moderately reduced. The estimated ejection fraction was in the range of 35% to 40%. Severe hypokinesis of the anterolateral, inferolateral, and apical myocardium; in the distribution of the left anterior descending and left circumflex coronary artery. - Mitral valve: There was mild to moderate regurgitation directed centrally. - Left atrium: The atrium was moderately dilated. - Atrial septum: No defect or patent foramen ovale was identified. - Pulmonary arteries: Systolic pressure was mildly increased. PA peak pressure: 42 mm Hg (  S).  TELE: NSR  ECG: SR, STD in lateral leads    ASSESSMENT AND PLAN  Ms Matus presents with NSTEMI and pulmonary edema. No prior h/o CAD. She was also profoundly hypertensive. It is possible that she had an infarct a month ago with her initial symptoms and now just has an exacerbation of heart failure. We won't know until we collect more data.   1. NSTEMI- Currently pain free. Troponin 3.6 --> 4.6 --> 3.9, Continue aspirin, heparin drip, statin and nitrates.  Cath scheduled for today. Echocardiogram shows LVEF 35% to 40%. Severe hypokinesis of the anterolateral, inferolateral, and apical myocardium; in the distribution of the left anterior descending and left circumflex coronary artery.  2.  Acute systolic CHF with LVEF 35-40%, pulmonary edema- lasix 40mg IV given yesterday, we will give one more dose, negative 0.5 L, on BiPAP  Hypoxic respiratory failure- improving with NIPPV and lasix. Will try to wean to nasal cannula.  We will start lisinopril 5 mg po daily.   3. Hypertension- improving, add lisinopril 5 mg po daily  4. Diabetes type 2- Hold metformin while inpatient, especially before cath  5. Hypothyroid- continue home synthroid   6. Full Code   Signed, Ashtynn Berke H MD, FACC 05/03/2014   

## 2014-05-03 NOTE — Progress Notes (Addendum)
Patient Name: Melinda Lewis Date of Encounter: 05/03/2014  Active Problems:   NSTEMI (non-ST elevated myocardial infarction)   Hypertension   Pulmonary edema cardiac cause   Length of Stay: 1  SUBJECTIVE  Chest pain free this am.  CURRENT MEDS . [START ON 05/04/2014] aspirin  81 mg Oral Pre-Cath  . aspirin EC  81 mg Oral Daily  . atorvastatin  40 mg Oral q1800  . clopidogrel  75 mg Oral Q breakfast  . insulin aspart  0-15 Units Subcutaneous TID WC  . levothyroxine  150 mcg Oral QAC breakfast  . metoprolol tartrate  12.5 mg Oral BID  . sodium chloride  3 mL Intravenous Q12H   . heparin 1,050 Units/hr (05/03/14 0800)  . nitroGLYCERIN 25 mcg/min (05/03/14 0800)   OBJECTIVE  Filed Vitals:   05/03/14 0600 05/03/14 0707 05/03/14 0809 05/03/14 0941  BP: 154/59  153/53   Pulse: 70  81 86  Temp:   97.5 F (36.4 C)   TempSrc:   Oral   Resp: 14  19   Height:      Weight:  173 lb 4.5 oz (78.6 kg)    SpO2: 97%  99%     Intake/Output Summary (Last 24 hours) at 05/03/14 1053 Last data filed at 05/03/14 0942  Gross per 24 hour  Intake 1250.31 ml  Output   1735 ml  Net -484.69 ml   Filed Weights   05/02/14 0730 05/03/14 0405 05/03/14 0707  Weight: 173 lb 8 oz (78.7 kg) 173 lb 4.5 oz (78.6 kg) 173 lb 4.5 oz (78.6 kg)   PHYSICAL EXAM  General: Pleasant, NAD. Neuro: Alert and oriented X 3. Moves all extremities spontaneously. Psych: Normal affect. HEENT:  Normal  Neck: Supple without bruits or JVD. Lungs:  Resp regular and unlabored, end-expiratory wheezes at the bases Heart: RRR no s3, s4, or murmurs. Abdomen: Soft, non-tender, non-distended, BS + x 4.  Extremities: No clubbing, cyanosis or edema. DP/PT/Radials 2+ and equal bilaterally.  Accessory Clinical Findings  CBC  Recent Labs  05/02/14 0920 05/03/14 0255  WBC 11.9* 11.1*  HGB 11.6* 10.2*  HCT 37.6 32.0*  MCV 92.6 89.9  PLT 294 262   Basic Metabolic Panel  Recent Labs  05/02/14 0301  05/02/14 0920  NA 135* 137  K 5.3 4.5  CL 100 96  CO2 22 25  GLUCOSE 230* 216*  BUN 20 19  CREATININE 1.04 1.01  CALCIUM 9.7 9.8    Recent Labs  05/02/14 0615 05/02/14 1145 05/02/14 1831  TROPONINI 3.69* 4.63* 3.95*    Recent Labs  05/02/14 0920  CHOL 157  HDL 50  LDLCALC 74  TRIG 164*  CHOLHDL 3.1   Radiology/Studies  Dg Chest Port 1 View  05/02/2014   CLINICAL DATA:  Code ST-elevation myocardial infarction.    IMPRESSION: Vascular congestion noted. Increased interstitial markings raise concern for mild interstitial edema.     ECHO: 05/02/2014 - Left ventricle: The cavity size was normal. Wall thickness was normal. Systolic function was moderately reduced. The estimated ejection fraction was in the range of 35% to 40%. Severe hypokinesis of the anterolateral, inferolateral, and apical myocardium; in the distribution of the left anterior descending and left circumflex coronary artery. - Mitral valve: There was mild to moderate regurgitation directed centrally. - Left atrium: The atrium was moderately dilated. - Atrial septum: No defect or patent foramen ovale was identified. - Pulmonary arteries: Systolic pressure was mildly increased. PA peak pressure: 42 mm Hg (  S).  TELE: NSR  ECG: SR, STD in lateral leads    ASSESSMENT AND PLAN  Ms Melinda Lewis presents with NSTEMI and pulmonary edema. No prior h/o CAD. She was also profoundly hypertensive. It is possible that she had an infarct a month ago with her initial symptoms and now just has an exacerbation of heart failure. We won't know until we collect more data.   1. NSTEMI- Currently pain free. Troponin 3.6 --> 4.6 --> 3.9, Continue aspirin, heparin drip, statin and nitrates.  Cath scheduled for today. Echocardiogram shows LVEF 35% to 40%. Severe hypokinesis of the anterolateral, inferolateral, and apical myocardium; in the distribution of the left anterior descending and left circumflex coronary artery.  2.  Acute systolic CHF with LVEF 35-40%, pulmonary edema- lasix 40mg  IV given yesterday, we will give one more dose, negative 0.5 L, on BiPAP  Hypoxic respiratory failure- improving with NIPPV and lasix. Will try to wean to nasal cannula.  We will start lisinopril 5 mg po daily.   3. Hypertension- improving, add lisinopril 5 mg po daily  4. Diabetes type 2- Hold metformin while inpatient, especially before cath  5. Hypothyroid- continue home synthroid   6. Full Code   Signed, Lars MassonNELSON, Kein Carlberg H MD, [facility] 05/03/2014

## 2014-05-03 NOTE — Interval H&P Note (Signed)
History and Physical Interval Note:  05/03/2014 4:19 PM  [name]  has presented today for surgery, with the diagnosis of NSTEMI  The various methods of treatment have been discussed with the patient and family. After consideration of risks, benefits and other options for treatment, the patient has consented to  Procedure(s): LEFT HEART CATHETERIZATION WITH CORONARY ANGIOGRAM (N/A) as a surgical intervention .  The patient's history has been reviewed, patient examined, no change in status, stable for surgery.  I have reviewed the patient's chart and labs.  Questions were answered to the patient's satisfaction.   Cath Lab Visit (complete for each Cath Lab visit)  Clinical Evaluation Leading to the Procedure:   ACS: Yes.    Non-ACS:    Anginal Classification: CCS IV  Anti-ischemic medical therapy: Minimal Therapy (1 class of medications)  Non-Invasive Test Results: No non-invasive testing performed  Prior CABG: No previous CABG        Theron Arista [facility] 05/03/2014 4:19 PM

## 2014-05-03 NOTE — Progress Notes (Signed)
ANTICOAGULATION CONSULT NOTE - Follow Up Consult  Pharmacy Consult for Heparin Indication: chest pain/ACS, NSTEMI  Allergies  Allergen Reactions  . Clonidine Derivatives Other (See Comments)    Maker her pass out  . Penicillins     Patient Measurements: Height: 5\' 2"  (157.5 cm) Weight: 173 lb 4.5 oz (78.6 kg) IBW/kg (Calculated) : 50.1 Heparin Dosing Weight: 68 kg  Vital Signs: Temp: 97.5 F (36.4 C) (08/17 0809) Temp src: Oral (08/17 0809) BP: 153/53 mmHg (08/17 0809) Pulse Rate: 86 (08/17 0941)  Labs:  Recent Labs  05/02/14 0301 05/02/14 0615 05/02/14 0920 05/02/14 1145 05/02/14 1831 05/03/14 0255  HGB 12.4  --  11.6*  --   --  10.2*  HCT 38.8  --  37.6  --   --  32.0*  PLT 353  --  294  --   --  262  APTT  --   --  107*  --   --   --   LABPROT  --   --  13.6  --   --   --   INR  --   --  1.04  --   --   --   HEPARINUNFRC  --   --   --  0.32 0.29* 0.47  CREATININE 1.04  --  1.01  --   --   --   TROPONINI  --  3.69*  --  4.63* 3.95*  --     Estimated Creatinine Clearance: 33.8 ml/min (by C-G formula based on Cr of 1.01).   Medications:  Infusions:  . heparin 1,050 Units/hr (05/03/14 0800)  . nitroGLYCERIN 25 mcg/min (05/03/14 0800)    Assessment: 80 yo F on heparin for NSTEMI on heparin at 1050 units/hr and noted at goal (HL= 0.47).  Noted plans for cardiac cath today.  Goal of Therapy:  Heparin level 0.3-0.7 units/ml Monitor platelets by anticoagulation protocol: Yes   Plan:  -No heparin changes needed -Will follow plans post cath  Harland German, Pharm D 05/03/2014 10:01 AM

## 2014-05-03 NOTE — CV Procedure (Signed)
    Cardiac Catheterization Procedure Note  Name: Melinda Lewis MRN: [id number] DOB: 1918/09/08  Procedure: Left Heart Cath, Selective Coronary Angiography,  PTCA and stenting of the LCx  Indication: 80 yo WF with history of DM and HTN presents with a NSTEMI.  Procedural Details:  The right wrist was prepped, draped, and anesthetized with 1% lidocaine. Using the modified Seldinger technique, a 6 French slender sheath was introduced into the right radial artery. 3 mg of verapamil was administered through the sheath, weight-based unfractionated heparin was administered intravenously. Standard Judkins catheters were used for selective coronary angiography and left ventriculography. Catheter exchanges were performed over an exchange length guidewire.  PROCEDURAL FINDINGS Hemodynamics: AO 123/48 mean 79 mm Hg LV 120/7 mm Hg   Coronary angiography: Coronary dominance: right  Left mainstem: Normal.   Left anterior descending (LAD): The LAD has complex disease involving the proximal and mid vessel with moderate calcification. There is a 90% stenosis in the proximal vessel after the first septal perforator. This is followed by a 90% angulated stenosis just proximal to the first diagonal. The mid LAD has diffuse disease up 80-90% extending to the third septal perforator. The third septal perforator has a 95% ostial stenosis.  Left circumflex (LCx): There is a 99% mid LCx stenosis prior to 2 OM branches with TIMI 1-2 flow.   Right coronary artery (RCA): The RCA is a dominant vessel with an 80% stenosis in the mid vessel. The distal vessel has diffuse disease to 30-40%.   Left ventriculography: Not done   PCI Note:  Following the diagnostic procedure, the decision was made to proceed with PCI of the culprit lesion in the mid LCx.  Weight-based bivalirudin was given for anticoagulation. Plavix 600 mg was given orally. Once a therapeutic ACT was achieved, a 6 [place] XBLAD 3.5 guide catheter was  inserted.  A prowater coronary guidewire was used to cross the lesion.  The lesion was predilated with a 2.5 mm balloon.  The lesion was then stented with a 3.0 x 20 mm Promus stent.  The stent was postdilated with a 3.25 mm noncompliant balloon.  Following PCI, there was 0% residual stenosis and TIMI-3 flow. Final angiography confirmed an excellent result. The patient tolerated the procedure well. There were no immediate procedural complications. A TR band was used for radial hemostasis. The patient was transferred to the post catheterization recovery area for further monitoring.  PCI Data: Vessel - LCx/Segment - mid Percent Stenosis (pre)  99% TIMI-flow 1-2 Stent 3.0 x 20 mm Promus Percent Stenosis (post) 0% TIMI-flow (post) 3  Final Conclusions:   1. Severe 3 vessel obstructive CAD 2. Successful stenting of culprit lesion in the mid LCx with a DES.   Recommendations:  Continue DAPT for at least a year. Will discuss with patient and family options of medical therapy versus PCI for other disease in LAD.  Peter [place], MDFACC 05/03/2014, 5:31 PM

## 2014-05-03 NOTE — Progress Notes (Signed)
Pt received from cath lab. Denies pain at present. Encouraged to leave R arm elevated on pillow. Awaiting dinner tray. Will monitor closely.

## 2014-05-04 ENCOUNTER — Encounter (HOSPITAL_COMMUNITY)
Admission: EM | Disposition: A | Discharge status: Home Health | Payer: Commercial Managed Care - HMO | Source: Home / Self Care | Attending: Cardiovascular Disease

## 2014-05-04 DIAGNOSIS — I2510 Atherosclerotic heart disease of native coronary artery without angina pectoris: Secondary | ICD-10-CM

## 2014-05-04 HISTORY — PX: PERCUTANEOUS CORONARY STENT INTERVENTION (PCI-S): SHX5485

## 2014-05-04 LAB — CBC
HEMATOCRIT: 32.8 % — AB (ref 36.0–46.0)
HEMOGLOBIN: 10.5 g/dL — AB (ref 12.0–15.0)
MCH: 28.7 pg (ref 26.0–34.0)
MCHC: 32 g/dL (ref 30.0–36.0)
MCV: 89.6 fL (ref 78.0–100.0)
Platelets: 248 10*3/uL (ref 150–400)
RBC: 3.66 MIL/uL — ABNORMAL LOW (ref 3.87–5.11)
RDW: 14.8 % (ref 11.5–15.5)
WBC: 11.6 10*3/uL — AB (ref 4.0–10.5)

## 2014-05-04 LAB — BASIC METABOLIC PANEL
Anion gap: 14 (ref 5–15)
BUN: 19 mg/dL (ref 6–23)
CALCIUM: 8.8 mg/dL (ref 8.4–10.5)
CO2: 25 mEq/L (ref 19–32)
Chloride: 98 mEq/L (ref 96–112)
Creatinine, Ser: 0.87 mg/dL (ref 0.50–1.10)
GFR calc Af Amer: 65 mL/min — ABNORMAL LOW (ref >90)
GFR, EST NON AFRICAN AMERICAN: 56 mL/min — AB (ref >90)
Glucose, Bld: 121 mg/dL — ABNORMAL HIGH (ref 70–99)
POTASSIUM: 3.8 meq/L (ref 3.7–5.3)
SODIUM: 137 meq/L (ref 137–147)

## 2014-05-04 LAB — GLUCOSE, CAPILLARY
GLUCOSE-CAPILLARY: 160 mg/dL — AB (ref 70–99)
GLUCOSE-CAPILLARY: 106 mg/dL — AB (ref 70–99)
Glucose-Capillary: 114 mg/dL — ABNORMAL HIGH (ref 70–99)
Glucose-Capillary: 224 mg/dL — ABNORMAL HIGH (ref 70–99)

## 2014-05-04 LAB — POCT ACTIVATED CLOTTING TIME: Activated Clotting Time: 794 seconds

## 2014-05-04 SURGERY — PERCUTANEOUS CORONARY STENT INTERVENTION (PCI-S)
Anesthesia: LOCAL

## 2014-05-04 MED ORDER — BIVALIRUDIN 250 MG IV SOLR
INTRAVENOUS | Status: AC
  Filled 2014-05-04: qty 250

## 2014-05-04 MED ORDER — HEPARIN (PORCINE) IN NACL 2-0.9 UNIT/ML-% IJ SOLN
INTRAMUSCULAR | Status: AC
  Filled 2014-05-04: qty 1000

## 2014-05-04 MED ORDER — FUROSEMIDE 10 MG/ML IJ SOLN
20.0000 mg | Freq: Once | INTRAMUSCULAR | Status: AC
  Administered 2014-05-04: 20 mg via INTRAVENOUS

## 2014-05-04 MED ORDER — SODIUM CHLORIDE 0.9 % IJ SOLN
3.0000 mL | Freq: Two times a day (BID) | INTRAMUSCULAR | Status: DC
  Administered 2014-05-04: 3 mL via INTRAVENOUS

## 2014-05-04 MED ORDER — FUROSEMIDE 40 MG PO TABS: 40.0000 mg | ORAL_TABLET | Freq: Every day | ORAL | Status: DC

## 2014-05-04 MED ORDER — SODIUM CHLORIDE 0.9 % IV SOLN: 250.0000 mL | INTRAVENOUS | Status: DC | PRN

## 2014-05-04 MED ORDER — SODIUM CHLORIDE 0.9 % IV SOLN
1.0000 mL/kg/h | INTRAVENOUS | Status: DC
  Administered 2014-05-04: 1 mL/kg/h via INTRAVENOUS

## 2014-05-04 MED ORDER — FUROSEMIDE 10 MG/ML IJ SOLN
INTRAMUSCULAR | Status: AC
  Administered 2014-05-04: 20 mg via INTRAVENOUS
  Filled 2014-05-04: qty 4

## 2014-05-04 MED ORDER — FUROSEMIDE 20 MG PO TABS
20.0000 mg | ORAL_TABLET | Freq: Every day | ORAL | Status: DC
  Administered 2014-05-05: 10:00:00 20 mg via ORAL
  Filled 2014-05-04: qty 1

## 2014-05-04 MED ORDER — MIDAZOLAM HCL 2 MG/2ML IJ SOLN
INTRAMUSCULAR | Status: AC
  Filled 2014-05-04: qty 2

## 2014-05-04 MED ORDER — FENTANYL CITRATE 0.05 MG/ML IJ SOLN
INTRAMUSCULAR | Status: AC
  Filled 2014-05-04: qty 2

## 2014-05-04 MED ORDER — SODIUM CHLORIDE 0.9 % IV SOLN
1.0000 mL/kg/h | INTRAVENOUS | Status: AC
  Administered 2014-05-04: 1 mL/kg/h via INTRAVENOUS

## 2014-05-04 MED ORDER — LIDOCAINE HCL (PF) 1 % IJ SOLN
INTRAMUSCULAR | Status: AC
  Filled 2014-05-04: qty 30

## 2014-05-04 MED ORDER — SODIUM CHLORIDE 0.9 % IJ SOLN: 3.0000 mL | INTRAMUSCULAR | Status: DC | PRN

## 2014-05-04 MED ORDER — FUROSEMIDE 20 MG PO TABS
20.0000 mg | ORAL_TABLET | Freq: Every day | ORAL | Status: DC
  Filled 2014-05-04: qty 1

## 2014-05-04 MED ORDER — NITROGLYCERIN 1 MG/10 ML FOR IR/CATH LAB
INTRA_ARTERIAL | Status: AC
  Filled 2014-05-04: qty 10

## 2014-05-04 MED ORDER — ALBUTEROL SULFATE (2.5 MG/3ML) 0.083% IN NEBU: 2.5000 mg | INHALATION_SOLUTION | RESPIRATORY_TRACT | Status: DC | PRN

## 2014-05-04 MED FILL — Sodium Chloride IV Soln 0.9%: INTRAVENOUS | Qty: 50 | Status: AC

## 2014-05-04 NOTE — H&P (View-Only) (Signed)
 Patient Name: Melinda Lewis Date of Encounter: 05/04/2014  Active Problems:   NSTEMI (non-ST elevated myocardial infarction)   Hypertension   Pulmonary edema cardiac cause   Length of Stay: 2  SUBJECTIVE  Chest pain free this am.  CURRENT MEDS . albuterol  2.5 mg Nebulization Q6H  . antiseptic oral rinse  7 mL Mouth Rinse BID  . aspirin EC  81 mg Oral Daily  . atorvastatin  40 mg Oral q1800  . clopidogrel  75 mg Oral Q breakfast  . heparin  5,000 Units Subcutaneous 3 times per day  . insulin aspart  0-15 Units Subcutaneous TID WC  . levothyroxine  150 mcg Oral QAC breakfast  . lisinopril  5 mg Oral Daily  . metoprolol tartrate  12.5 mg Oral BID  . sodium chloride  3 mL Intravenous Q12H   . sodium chloride 1 mL/kg/hr (05/04/14 1014)   OBJECTIVE  Filed Vitals:   05/04/14 0800 05/04/14 0830 05/04/14 0851 05/04/14 1012  BP:  145/39    Pulse: 98   108  Temp:  99.1 F (37.3 C)    TempSrc:  Oral    Resp: 16     Height:      Weight:   173 lb 15.1 oz (78.9 kg)   SpO2: 97% 100%      Intake/Output Summary (Last 24 hours) at 05/04/14 1057 Last data filed at 05/04/14 0800  Gross per 24 hour  Intake 1204.04 ml  Output   2970 ml  Net -1765.96 ml   Filed Weights   05/03/14 0707 05/04/14 0300 05/04/14 0851  Weight: 173 lb 4.5 oz (78.6 kg) 173 lb 15.1 oz (78.9 kg) 173 lb 15.1 oz (78.9 kg)   PHYSICAL EXAM  General: Pleasant, NAD. Neuro: Alert and oriented X 3. Moves all extremities spontaneously. Psych: Normal affect. HEENT:  Normal  Neck: Supple without bruits or JVD. Lungs:  Resp regular and unlabored, mild crackles at the basis  Heart: RRR no s3, s4, or murmurs. Abdomen: Soft, non-tender, non-distended, BS + x 4.  Extremities: No clubbing, cyanosis or edema. DP/PT/Radials 2+ and equal bilaterally.  Accessory Clinical Findings  CBC  Recent Labs  05/03/14 1837 05/04/14 0344  WBC 12.1* 11.6*  HGB 11.8* 10.5*  HCT 36.6 32.8*  MCV 89.5 89.6  PLT 274 248    Basic Metabolic Panel  Recent Labs  05/02/14 0920 05/03/14 1837 05/04/14 0344  NA 137  --  137  K 4.5  --  3.8  CL 96  --  98  CO2 25  --  25  GLUCOSE 216*  --  121*  BUN 19  --  19  CREATININE 1.01 0.86 0.87  CALCIUM 9.8  --  8.8    Recent Labs  05/02/14 0615 05/02/14 1145 05/02/14 1831  TROPONINI 3.69* 4.63* 3.95*    Recent Labs  05/02/14 0920  CHOL 157  HDL 50  LDLCALC 74  TRIG 164*  CHOLHDL 3.1   Radiology/Studies  Dg Chest Port 1 View  05/02/2014   CLINICAL DATA:  Code ST-elevation myocardial infarction.    IMPRESSION: Vascular congestion noted. Increased interstitial markings raise concern for mild interstitial edema.     ECHO: 05/02/2014 - Left ventricle: The cavity size was normal. Wall thickness was normal. Systolic function was moderately reduced. The estimated ejection fraction was in the range of 35% to 40%. Severe hypokinesis of the anterolateral, inferolateral, and apical myocardium; in the distribution of the left anterior descending and left circumflex   coronary artery. - Mitral valve: There was mild to moderate regurgitation directed centrally. - Left atrium: The atrium was moderately dilated. - Atrial septum: No defect or patent foramen ovale was identified. - Pulmonary arteries: Systolic pressure was mildly increased. PA peak pressure: 42 mm Hg (S).  TELE: NSR  ECG: SR, STD in lateral leads  Left cardiac cath: 05/03/2014 PCI Data:  Vessel - LCx/Segment - mid  Percent Stenosis (pre) 99%  TIMI-flow 1-2  Stent 3.0 x 20 mm Promus  Percent Stenosis (post) 0%  TIMI-flow (post) 3  Final Conclusions:  1. Severe 3 vessel obstructive CAD  2. Successful stenting of culprit lesion in the mid LCx with a DES.  Recommendations:  Continue DAPT for at least a year. Will discuss with patient and family options of medical therapy versus PCI for other disease in LAD.      ASSESSMENT AND PLAN  Ms Rabalais presents with NSTEMI and pulmonary  edema. No prior h/o CAD. She was also profoundly hypertensive. It is possible that she had an infarct a month ago with her initial symptoms and now just has an exacerbation of heart failure. We won't know until we collect more data.   1. NSTEMI- Currently pain free. Troponin 3.6 --> 4.6 --> 3.9, post cath yesterday with findings of severe 3 vessel disease.  S/P to mid LCX - culprit yesterday, planned PCI to LAD today. Continue DAPT for at least a year. Continue aspirin, plavix, statin, lisinopril and metoprolol.  Echocardiogram shows LVEF 35% to 40%. Severe hypokinesis of the anterolateral, inferolateral, and apical myocardium; in the distribution of the left anterior descending and left circumflex coronary artery.  2. Acute systolic CHF with LVEF 35-40%, pulmonary edema - 1.7 L since yesterday, clinical improvement, still fluid overloaded, continue low dose iv lasix 20 mg daily, we will switch to PO post cath  3. Hypertension- improving, uptitrate metoprolol today  4. Diabetes type 2- Hold metformin while inpatient, especially before cath  5. Hypothyroid- continue home synthroid   6. Full Code   Signed, Lars Masson MD, [facility] 05/04/2014

## 2014-05-04 NOTE — Interval H&P Note (Signed)
History and Physical Interval Note:  05/04/2014 4:07 PM  Melinda Lewis  has presented today for surgery, with the diagnosis of cad  The various methods of treatment have been discussed with the patient and family. After consideration of risks, benefits and other options for treatment, the patient has consented to  Procedure(s): PERCUTANEOUS CORONARY STENT INTERVENTION (PCI-S) (N/A) as a surgical intervention .  The patient's history has been reviewed, patient examined, no change in status, stable for surgery.  I have reviewed the patient's chart and labs.  Questions were answered to the patient's satisfaction.   Cath Lab Visit (complete for each Cath Lab visit)  Clinical Evaluation Leading to the Procedure:   ACS: Yes.    Non-ACS:    Anginal Classification: CCS IV  Anti-ischemic medical therapy: Maximal Therapy (2 or more classes of medications)  Non-Invasive Test Results: No non-invasive testing performed  Prior CABG: No previous CABG        Theron Arista [facility] 05/04/2014 4:07 PM

## 2014-05-04 NOTE — CV Procedure (Signed)
    CARDIAC CATH NOTE  Name: Melinda Lewis MRN: [id number] DOB: Dec 14, 1917  Procedure: PTCA and stenting of the proximal to mid LAD  Indication: 79 yo WF presented with NSTEMI. She underwent PCI of the culprit lesion in the LCx yesterday. She also had severe diffuse disease in the LAD.  Procedural Details: The right wrist was prepped, draped, and anesthetized with 1% lidocaine. Using the modified Seldinger technique, a 6 Fr slender sheath was introduced into the radial artery. 3 mg verapamil was administered through the radial sheath. Weight-based bivalirudin was given for anticoagulation. Once a therapeutic ACT was achieved, a 6 [place] XBLAD guide catheter was inserted.  A prowater coronary guidewire was used to cross the lesion with significant difficulty.  The lesion was predilated with a 2.0 mm balloon.  At this point a stent could not be passed and the lesion was then predilated with a 2.5 mm noncompliant balloon. The lesion was then stented with a 2.5 x 38 mm Promus stent.  The stent was postdilated with a 2.5 mm noncompliant balloon in the mid LAD and a 3.0 mm noncompliant balloon in the proximal LAD.  Following PCI, there was 0% residual stenosis and TIMI-3 flow. There was no compromise of the septal and diagonal side branches.  Final angiography confirmed an excellent result. The patient tolerated the procedure well and was pain free. There were no immediate procedural complications. A TR band was used for radial hemostasis. The patient was transferred to the post catheterization recovery area for further monitoring.  Lesion Data: Vessel: LAD- proximal to mid Percent stenosis (pre): 90% TIMI-flow (pre):  3 Stent:  2.5 x 38 mm Promus Percent stenosis (post): 0% TIMI-flow (post): 3  Conclusions: Successful stenting of the proximal to mid LAD with a DES.  Recommendations: Continue DAPT for at least one year. Anticipate DC tomorrow if no complications.   Peter [place],  MDFACC 05/04/2014, 5:29 PM

## 2014-05-04 NOTE — Progress Notes (Signed)
TR BAND REMOVAL  LOCATION:  right radial  DEFLATED PER PROTOCOL:  Yes.    TIME BAND OFF / DRESSING APPLIED:   2145   SITE UPON ARRIVAL:   Level 0  SITE AFTER BAND REMOVAL:  Level 0  REVERSE ALLEN'S TEST:    positive  CIRCULATION SENSATION AND MOVEMENT:  Within Normal Limits  Yes.    COMMENTS:    

## 2014-05-04 NOTE — Progress Notes (Signed)
I had a long discussion with the patient and her family concerning disease in the LAD and RCA territories. The LAD has complex disease involving a long angulated segment with calcification. There is very severe stenosis. The RCA has moderate to severe disease 80% in the mid vessel. We discussed the options of PCI versus medical therapy. I reviewed the risks including MI, arrhythmia, bleeding, perforation, and death. It would be my recommendation to treat the LAD disease with PCI and manage the disease in the RCA with medication unless she is still symptomatic. After review of options patient is agreeable to proceed with PCI of the LAD. Will plan for later today. NPO after breakfast.   Peter Swazil[place] MD, [facility]

## 2014-05-04 NOTE — Progress Notes (Addendum)
Patient Name: Linard MillersMildred P Anand Date of Encounter: 05/04/2014  Active Problems:   NSTEMI (non-ST elevated myocardial infarction)   Hypertension   Pulmonary edema cardiac cause   Length of Stay: 2  SUBJECTIVE  Chest pain free this am.  CURRENT MEDS . albuterol  2.5 mg Nebulization Q6H  . antiseptic oral rinse  7 mL Mouth Rinse BID  . aspirin EC  81 mg Oral Daily  . atorvastatin  40 mg Oral q1800  . clopidogrel  75 mg Oral Q breakfast  . heparin  5,000 Units Subcutaneous 3 times per day  . insulin aspart  0-15 Units Subcutaneous TID WC  . levothyroxine  150 mcg Oral QAC breakfast  . lisinopril  5 mg Oral Daily  . metoprolol tartrate  12.5 mg Oral BID  . sodium chloride  3 mL Intravenous Q12H   . sodium chloride 1 mL/kg/hr (05/04/14 1014)   OBJECTIVE  Filed Vitals:   05/04/14 0800 05/04/14 0830 05/04/14 0851 05/04/14 1012  BP:  145/39    Pulse: 98   108  Temp:  99.1 F (37.3 C)    TempSrc:  Oral    Resp: 16     Height:      Weight:   173 lb 15.1 oz (78.9 kg)   SpO2: 97% 100%      Intake/Output Summary (Last 24 hours) at 05/04/14 1057 Last data filed at 05/04/14 0800  Gross per 24 hour  Intake 1204.04 ml  Output   2970 ml  Net -1765.96 ml   Filed Weights   05/03/14 0707 05/04/14 0300 05/04/14 0851  Weight: 173 lb 4.5 oz (78.6 kg) 173 lb 15.1 oz (78.9 kg) 173 lb 15.1 oz (78.9 kg)   PHYSICAL EXAM  General: Pleasant, NAD. Neuro: Alert and oriented X 3. Moves all extremities spontaneously. Psych: Normal affect. HEENT:  Normal  Neck: Supple without bruits or JVD. Lungs:  Resp regular and unlabored, mild crackles at the basis  Heart: RRR no s3, s4, or murmurs. Abdomen: Soft, non-tender, non-distended, BS + x 4.  Extremities: No clubbing, cyanosis or edema. DP/PT/Radials 2+ and equal bilaterally.  Accessory Clinical Findings  CBC  Recent Labs  05/03/14 1837 05/04/14 0344  WBC 12.1* 11.6*  HGB 11.8* 10.5*  HCT 36.6 32.8*  MCV 89.5 89.6  PLT 274 248    Basic Metabolic Panel  Recent Labs  05/02/14 0920 05/03/14 1837 05/04/14 0344  NA 137  --  137  K 4.5  --  3.8  CL 96  --  98  CO2 25  --  25  GLUCOSE 216*  --  121*  BUN 19  --  19  CREATININE 1.01 0.86 0.87  CALCIUM 9.8  --  8.8    Recent Labs  05/02/14 0615 05/02/14 1145 05/02/14 1831  TROPONINI 3.69* 4.63* 3.95*    Recent Labs  05/02/14 0920  CHOL 157  HDL 50  LDLCALC 74  TRIG 164*  CHOLHDL 3.1   Radiology/Studies  Dg Chest Port 1 View  05/02/2014   CLINICAL DATA:  Code ST-elevation myocardial infarction.    IMPRESSION: Vascular congestion noted. Increased interstitial markings raise concern for mild interstitial edema.     ECHO: 05/02/2014 - Left ventricle: The cavity size was normal. Wall thickness was normal. Systolic function was moderately reduced. The estimated ejection fraction was in the range of 35% to 40%. Severe hypokinesis of the anterolateral, inferolateral, and apical myocardium; in the distribution of the left anterior descending and left circumflex  coronary artery. - Mitral valve: There was mild to moderate regurgitation directed centrally. - Left atrium: The atrium was moderately dilated. - Atrial septum: No defect or patent foramen ovale was identified. - Pulmonary arteries: Systolic pressure was mildly increased. PA peak pressure: 42 mm Hg (S).  TELE: NSR  ECG: SR, STD in lateral leads  Left cardiac cath: 05/03/2014 PCI Data:  Vessel - LCx/Segment - mid  Percent Stenosis (pre) 99%  TIMI-flow 1-2  Stent 3.0 x 20 mm Promus  Percent Stenosis (post) 0%  TIMI-flow (post) 3  Final Conclusions:  1. Severe 3 vessel obstructive CAD  2. Successful stenting of culprit lesion in the mid LCx with a DES.  Recommendations:  Continue DAPT for at least a year. Will discuss with patient and family options of medical therapy versus PCI for other disease in LAD.      ASSESSMENT AND PLAN  Ms Rabalais presents with NSTEMI and pulmonary  edema. No prior h/o CAD. She was also profoundly hypertensive. It is possible that she had an infarct a month ago with her initial symptoms and now just has an exacerbation of heart failure. We won't know until we collect more data.   1. NSTEMI- Currently pain free. Troponin 3.6 --> 4.6 --> 3.9, post cath yesterday with findings of severe 3 vessel disease.  S/P to mid LCX - culprit yesterday, planned PCI to LAD today. Continue DAPT for at least a year. Continue aspirin, plavix, statin, lisinopril and metoprolol.  Echocardiogram shows LVEF 35% to 40%. Severe hypokinesis of the anterolateral, inferolateral, and apical myocardium; in the distribution of the left anterior descending and left circumflex coronary artery.  2. Acute systolic CHF with LVEF 35-40%, pulmonary edema - 1.7 L since yesterday, clinical improvement, still fluid overloaded, continue low dose iv lasix 20 mg daily, we will switch to PO post cath  3. Hypertension- improving, uptitrate metoprolol today  4. Diabetes type 2- Hold metformin while inpatient, especially before cath  5. Hypothyroid- continue home synthroid   6. Full Code   Signed, Lars Masson MD, [facility] 05/04/2014

## 2014-05-05 ENCOUNTER — Other Ambulatory Visit: Payer: Self-pay | Admitting: Physician Assistant

## 2014-05-05 ENCOUNTER — Telehealth: Payer: Self-pay | Admitting: Cardiology

## 2014-05-05 DIAGNOSIS — I5021 Acute systolic (congestive) heart failure: Secondary | ICD-10-CM

## 2014-05-05 DIAGNOSIS — I2510 Atherosclerotic heart disease of native coronary artery without angina pectoris: Secondary | ICD-10-CM

## 2014-05-05 DIAGNOSIS — I214 Non-ST elevation (NSTEMI) myocardial infarction: Secondary | ICD-10-CM

## 2014-05-05 DIAGNOSIS — E039 Hypothyroidism, unspecified: Secondary | ICD-10-CM

## 2014-05-05 DIAGNOSIS — D649 Anemia, unspecified: Secondary | ICD-10-CM

## 2014-05-05 LAB — BASIC METABOLIC PANEL
Anion gap: 12 (ref 5–15)
BUN: 19 mg/dL (ref 6–23)
CO2: 24 meq/L (ref 19–32)
Calcium: 8.9 mg/dL (ref 8.4–10.5)
Chloride: 100 mEq/L (ref 96–112)
Creatinine, Ser: 0.91 mg/dL (ref 0.50–1.10)
GFR calc Af Amer: 61 mL/min — ABNORMAL LOW (ref >90)
GFR calc non Af Amer: 53 mL/min — ABNORMAL LOW (ref >90)
Glucose, Bld: 131 mg/dL — ABNORMAL HIGH (ref 70–99)
Potassium: 4.3 mEq/L (ref 3.7–5.3)
SODIUM: 136 meq/L — AB (ref 137–147)

## 2014-05-05 LAB — CBC
HCT: 30.7 % — ABNORMAL LOW (ref 36.0–46.0)
HEMOGLOBIN: 9.8 g/dL — AB (ref 12.0–15.0)
MCH: 29 pg (ref 26.0–34.0)
MCHC: 31.9 g/dL (ref 30.0–36.0)
MCV: 90.8 fL (ref 78.0–100.0)
Platelets: 234 10*3/uL (ref 150–400)
RBC: 3.38 MIL/uL — AB (ref 3.87–5.11)
RDW: 15.3 % (ref 11.5–15.5)
WBC: 8.8 10*3/uL (ref 4.0–10.5)

## 2014-05-05 LAB — GLUCOSE, CAPILLARY: GLUCOSE-CAPILLARY: 114 mg/dL — AB (ref 70–99)

## 2014-05-05 MED ORDER — FUROSEMIDE 20 MG PO TABS
20.0000 mg | ORAL_TABLET | Freq: Every day | ORAL | Status: DC
Start: 2014-05-05 — End: 2014-05-05

## 2014-05-05 MED ORDER — FUROSEMIDE 20 MG PO TABS: 20.0000 mg | ORAL_TABLET | Freq: Every day | ORAL | Status: DC

## 2014-05-05 MED ORDER — ATORVASTATIN CALCIUM 40 MG PO TABS: 40.0000 mg | ORAL_TABLET | Freq: Every day | ORAL | Status: DC

## 2014-05-05 MED ORDER — LISINOPRIL 5 MG PO TABS: 5.0000 mg | ORAL_TABLET | Freq: Every day | ORAL | Status: DC

## 2014-05-05 MED ORDER — CLOPIDOGREL BISULFATE 75 MG PO TABS: 75.0000 mg | ORAL_TABLET | Freq: Every day | ORAL | Status: DC

## 2014-05-05 MED ORDER — METOPROLOL SUCCINATE ER 25 MG PO TB24: 25.0000 mg | ORAL_TABLET | Freq: Every day | ORAL | Status: DC

## 2014-05-05 MED ORDER — METOPROLOL SUCCINATE ER 25 MG PO TB24
25.0000 mg | ORAL_TABLET | Freq: Every day | ORAL | Status: DC
  Administered 2014-05-05: 11:00:00 25 mg via ORAL
  Filled 2014-05-05 (×2): qty 1

## 2014-05-05 MED FILL — Sodium Chloride IV Soln 0.9%: INTRAVENOUS | Qty: 50 | Status: AC

## 2014-05-05 NOTE — Progress Notes (Signed)
CARDIAC REHAB PHASE I   PRE:  Rate/Rhythm: 90 SR  BP:  Supine: 129/21  Sitting:   Standing:    SaO2: 97% 2L  MODE:  Ambulation: 275 ft   POST:  Rate/Rhythm: 121 ST  BP:  Supine:   Sitting: 117/33  Standing:    SaO2: 90-91%RA 2956-2130 Waited for pt to eat breakfast prior to walk. Began ed as pt neared ending of breakfast. Daughter present for ed. Education completed with pt and daughter emphasizing importance of plavix with stents. Pt had some difficulty hearing so daughter to re enforce ed. Reviewed NTG use. Pt walked 275 ft on RA with rolling walker. Pt was a little wobbly upon first getting up. Has rollator at home to use. Asst. X 1 to walk. Pt c/o back pain with short walk. Did not give ex ed but encouraged pt to walk as tolerated with rollator as needed. To bathroom for BM. Discussed CRP2 and gave brochure for [facility]. Pt agreed to referral. Pt's daughter would like to see case manager. Notified pt's RN. To chair after walk and bathroom trip. No CP.   Luetta Nutting, RN BSN  05/05/2014 9:38 AM

## 2014-05-05 NOTE — Telephone Encounter (Signed)
Returned call to patient's daughter Jasmine December.She stated mother had swelling in left ankle.Stated swelling better this afternoon.Stated she did not have any refills for lasix and lisinopril.Refills sent to pharmacy.Bmet to be done 05/12/14 at [facility] lab.Post hospital follow up scheduled with Dr.Jordan 06/15/14 at 2:15 pm.Advised to call back if needed.

## 2014-05-05 NOTE — Telephone Encounter (Signed)
Pt just discharged from the hospital today,had stents put in. Pt left foot is swollen,did not swell in the hospital. Pt wants to know what is causing this?

## 2014-05-05 NOTE — Discharge Summary (Signed)
Discharge Summary   Patient ID: Melinda Lewis,  MRN: [id number], DOB/AGE: 1918/07/20 80 y.o.  Admit date: 05/02/2014 Discharge date: 05/05/2014  Primary Care Provider: SPARKS,JEFFREY D Primary Cardiologist: Dr. Swaziland  Discharge Diagnoses Principal Problem:   NSTEMI (non-ST elevated myocardial infarction) Active Problems:   Hypertension   Pulmonary edema cardiac cause   Anemia   Hypothyroidism   Triple vessel coronary artery disease   Allergies Allergies  Allergen Reactions  . Clonidine Derivatives Other (See Comments)    Maker her pass out  . Penicillins     Procedures  Echocardiogram LV EF: 35% - 40%  ------------------------------------------------------------------- Indications: Chest pain 786.51.  ------------------------------------------------------------------- History: Risk factors: NSTEMI. Pulmonary edema. Hypertension.  ------------------------------------------------------------------- Study Conclusions  - Left ventricle: The cavity size was normal. Wall thickness was normal. Systolic function was moderately reduced. The estimated ejection fraction was in the range of 35% to 40%. Severe hypokinesis of the anterolateral, inferolateral, and apical myocardium; in the distribution of the left anterior descending and left circumflex coronary artery. - Mitral valve: There was mild to moderate regurgitation directed centrally. - Left atrium: The atrium was moderately dilated. - Atrial septum: No defect or patent foramen ovale was identified. - Pulmonary arteries: Systolic pressure was mildly increased. PA peak pressure: 42 mm Hg (S).      Cardiac catheterization Cath #1  Procedure: Left Heart Cath, Selective Coronary Angiography, PTCA and stenting of the LCx  PROCEDURAL FINDINGS  Hemodynamics:  AO 123/48 mean 79 mm Hg  LV 120/7 mm Hg  Coronary angiography:  Coronary dominance: right  Left mainstem: Normal.  Left anterior descending (LAD):  The LAD has complex disease involving the proximal and mid vessel with moderate calcification. There is a 90% stenosis in the proximal vessel after the first septal perforator. This is followed by a 90% angulated stenosis just proximal to the first diagonal. The mid LAD has diffuse disease up 80-90% extending to the third septal perforator. The third septal perforator has a 95% ostial stenosis.  Left circumflex (LCx): There is a 99% mid LCx stenosis prior to 2 OM branches with TIMI 1-2 flow.  Right coronary artery (RCA): The RCA is a dominant vessel with an 80% stenosis in the mid vessel. The distal vessel has diffuse disease to 30-40%.  Left ventriculography: Not done   PCI Note: Following the diagnostic procedure, the decision was made to proceed with PCI of the culprit lesion in the mid LCx. Weight-based bivalirudin was given for anticoagulation. Plavix 600 mg was given orally. Once a therapeutic ACT was achieved, a 6 [place] XBLAD 3.5 guide catheter was inserted. A prowater coronary guidewire was used to cross the lesion. The lesion was predilated with a 2.5 mm balloon. The lesion was then stented with a 3.0 x 20 mm Promus stent. The stent was postdilated with a 3.25 mm noncompliant balloon. Following PCI, there was 0% residual stenosis and TIMI-3 flow. Final angiography confirmed an excellent result. The patient tolerated the procedure well. There were no immediate procedural complications. A TR band was used for radial hemostasis. The patient was transferred to the post catheterization recovery area for further monitoring.  PCI Data:  Vessel - LCx/Segment - mid  Percent Stenosis (pre) 99%  TIMI-flow 1-2  Stent 3.0 x 20 mm Promus  Percent Stenosis (post) 0%  TIMI-flow (post) 3  Final Conclusions:  1. Severe 3 vessel obstructive CAD  2. Successful stenting of culprit lesion in the mid LCx with a DES.  Recommendations:  Continue DAPT for  at least a year. Will discuss with patient and family  options of medical therapy versus PCI for other disease in LAD.   Cath #2  Procedure: PTCA and stenting of the proximal to mid LAD  Lesion Data:  Vessel: LAD- proximal to mid  Percent stenosis (pre): 90%  TIMI-flow (pre): 3  Stent: 2.5 x 38 mm Promus  Percent stenosis (post): 0%  TIMI-flow (post): 3  Conclusions: Successful stenting of the proximal to mid LAD with a DES.  Recommendations: Continue DAPT for at least one year. Anticipate DC tomorrow if no complications.        Hospital Course  The patient is a 80 year old female with past medical history of type 2 diabetes, hypothyroidism and hypertension who presented to [facility] on 05/[facility]/2014 with chest pain or shortness of breath. Of note, patient had similar pain a month ago which went away after 2 hours. While in the ambulance en route to the ED, she became hypoxic and was started on nonrebreather. While in the ED, she was found to be profoundly hypertensive with rales bilaterally. Initial chest x-ray showed vascular congestion with increased interstitial markings concerning for mild interstitial edema. She was started on IV Lasix, heparin, BiPAP and nitroglycerin drip. Echocardiogram was obtained on the day of admission which showed EF 35-40%, hypokinesis of the anterolateral, inferolateral and apical myocardium, mild to moderate mitral regurg, PA peak pressure 42.   The patient was diuresed overnight with good urine output and underwent cardiac catheterization on 05/03/2014 which revealed severe three-vessel obstructive CAD with 80-90% mid LAD stenosis, 99% mid left circumflex stenosis which was treated with drug-eluting stent, 80% mid RCA stenosis. After PCI of the culprit left circumflex lesion, patient was transferred back to the unit to allow recovery and plan for a staged PCI therapy for the other severe coronary lesions. She was also placed on aspirin and Plavix. Dr. SwazilandJordan had a long discussion with the patient on 05/04/2014  after reviewing her anatomy, it was decided for stage PCI of her severe 90% mid LAD stenosis, however for her 80% moderate to severe RCA stenosis, it was recommended to treat medically. Patient underwent second part of her staged PCI and received a drug-eluting stent to mid LAD on 05/04/2014.  She was seen the morning of 05/05/2014, at which time she denies any significant chest pain or shortness of breath. Her IV Lasix has transitioned to 20 mg PO Lasix. Her creatinine was stable at 0.91. Hemoglobin has been trending down from 11.8 down to 9.8 after 2 back-to-back heart catheterization, however patient has no obvious bleeding. Patient is deemed stable for discharge from cardiology perspective. If the patient has recurrent anginal symptom, then we will consider potential PCI of her 80% RCA residual. Patient was initially seen by Dr. Keene Breathoitoru however she wished to followup with Dr. SwazilandJordan instead. I have left message with scheduler at [facility] to call patient with followup in 2-4 weeks. Since the patient is newly placed on Lasix, she will also need to check BMET 1 week after discharge. I have also scheduled home health with case management to help assist the patient after discharge.   Of note, multiple blood pressure medication has been taken off during this admission. On the next followup, patient's blood pressure should be reevaluated and current blood pressure medication should be up titrated if pressure allows. Patient has been advised not to resume metformin for least for 48 hours after cardiac catheterization with contrast dye use. She can resume metformin  on August 21.   Discharge Vitals Blood pressure 137/24, pulse 75, temperature 98.2 F (36.8 C), temperature source Oral, resp. rate 18, height 5\' 2"  (1.575 m), weight 174 lb 13.2 oz (79.3 kg), SpO2 98.00%.  Filed Weights   05/04/14 0300 05/04/14 0851 05/05/14 0300  Weight: 173 lb 15.1 oz (78.9 kg) 173 lb 15.1 oz (78.9 kg) 174 lb 13.2 oz (79.3  kg)    Labs  CBC  Recent Labs  05/04/14 0344 05/05/14 0300  WBC 11.6* 8.8  HGB 10.5* 9.8*  HCT 32.8* 30.7*  MCV 89.6 90.8  PLT 248 234   Basic Metabolic Panel  Recent Labs  05/04/14 0344 05/05/14 0300  NA 137 136*  K 3.8 4.3  CL 98 100  CO2 25 24  GLUCOSE 121* 131*  BUN 19 19  CREATININE 0.87 0.91  CALCIUM 8.8 8.9   Cardiac Enzymes  Recent Labs  05/02/14 1145 05/02/14 1831  TROPONINI 4.63* 3.95*   Fasting Lipid Panel No results found for this basename: CHOL, HDL, LDLCALC, TRIG, CHOLHDL, LDLDIRECT,  in the last 72 hours  Disposition  Pt is being discharged home today in good condition.  Follow-up Plans & Appointments      Follow-up Information   Follow up with [facility]. (Designer, jewellery, Physical Therapy, Home Health Aid, Social Work Services to start within 24-48 hours of discharge)    Contact information:   [address] [place] [address] [phone]       Follow up with Peter Swaziland, MD. (Office will call to schedule followup in 2-4wks, if you do not hear from [place] in 2 busness days, please give [place] a call)    Specialty:  Cardiology   Contact information:   [address] [place] [address] [phone]       Follow up with [facility] On 05/12/2014. (Office will call to schedule BMET check in 1 week after discharge)    Contact information:   [address] [place] [address] [phone]      Discharge Medications    Medication List    STOP taking these medications       hydrALAZINE 50 MG tablet  Commonly known as:  APRESOLINE     isosorbide mononitrate 30 MG 24 hr tablet  Commonly known as:  IMDUR     ramipril 10 MG capsule  Commonly known as:  ALTACE     telmisartan 80 MG tablet  Commonly known as:  MICARDIS      TAKE these medications       aspirin EC 81 MG tablet  Take 81 mg by mouth daily.     atorvastatin 40 MG tablet  Commonly known as:   LIPITOR  Take 1 tablet (40 mg total) by mouth daily at 6 PM.     clopidogrel 75 MG tablet  Commonly known as:  PLAVIX  Take 1 tablet (75 mg total) by mouth daily.     FLAX SEED OIL PO  Take 1 tablet by mouth daily.     furosemide 20 MG tablet  Commonly known as:  LASIX  Take 1 tablet (20 mg total) by mouth daily.     L-Arginine 1000 MG Tabs  Take 1 tablet by mouth 3 (three) times daily.     levothyroxine 150 MCG tablet  Commonly known as:  SYNTHROID, LEVOTHROID  Take 150 mcg by mouth daily before breakfast.     lisinopril 5 MG tablet  Commonly known as:  PRINIVIL,ZESTRIL  Take 1 tablet (5 mg total) by mouth daily.     magnesium oxide 400 MG tablet  Commonly known as:  MAG-OX  Take 400 mg by mouth 2 (two) times daily.     metFORMIN 500 MG tablet  Commonly known as:  GLUCOPHAGE  Take 500 mg by mouth 2 (two) times daily with a meal.     metoprolol succinate 25 MG 24 hr tablet  Commonly known as:  TOPROL-XL  Take 1 tablet (25 mg total) by mouth daily.     pantoprazole 40 MG tablet  Commonly known as:  PROTONIX  Take 40 mg by mouth daily.     QUEtiapine 25 MG tablet  Commonly known as:  SEROQUEL  Take 25 mg by mouth at bedtime.        Outstanding Labs/Studies  BMET in 1 week  Duration of Discharge Encounter   Greater than 30 minutes including physician time.  Ramond Dial PA-C Pager: [phone] 05/05/2014, 11:11 AM

## 2014-05-05 NOTE — Progress Notes (Signed)
Patient Name: Melinda Lewis Date of Encounter: 05/05/2014     Principal Problem:   NSTEMI (non-ST elevated myocardial infarction) Active Problems:   Hypertension   Pulmonary edema cardiac cause   Anemia   Hypothyroidism   Triple vessel coronary artery disease    SUBJECTIVE  No CP or SOB  CURRENT MEDS . antiseptic oral rinse  7 mL Mouth Rinse BID  . aspirin EC  81 mg Oral Daily  . atorvastatin  40 mg Oral q1800  . clopidogrel  75 mg Oral Q breakfast  . furosemide  20 mg Oral Daily  . insulin aspart  0-15 Units Subcutaneous TID WC  . levothyroxine  150 mcg Oral QAC breakfast  . lisinopril  5 mg Oral Daily  . metoprolol tartrate  12.5 mg Oral BID    OBJECTIVE  Filed Vitals:   05/05/14 0300 05/05/14 0400 05/05/14 0500 05/05/14 0700  BP: 133/39 106/24 122/26 137/24  Pulse: 81 73 80 75  Temp: 98.3 F (36.8 C)   98.2 F (36.8 C)  TempSrc: Oral   Oral  Resp: 20   18  Height:      Weight: 174 lb 13.2 oz (79.3 kg)     SpO2: 98% 96% 98% 98%    Intake/Output Summary (Last 24 hours) at 05/05/14 0816 Last data filed at 05/04/14 1540  Gross per 24 hour  Intake 548.69 ml  Output   1325 ml  Net -776.31 ml   Filed Weights   05/04/14 0300 05/04/14 0851 05/05/14 0300  Weight: 173 lb 15.1 oz (78.9 kg) 173 lb 15.1 oz (78.9 kg) 174 lb 13.2 oz (79.3 kg)    PHYSICAL EXAM  General: Pleasant, NAD. Neuro: Alert and oriented X 3. Moves all extremities spontaneously. Psych: Normal affect. HEENT:  Normal  Neck: Supple without bruits or JVD. Lungs:  Resp regular and unlabored, CTA, mild rale on RLL (dependent side) Heart: RRR no s3, s4, or murmurs. Abdomen: Soft, non-tender, non-distended, BS + x 4. R radial site stable Extremities: No clubbing, cyanosis or edema. DP/PT/Radials 2+ and equal bilaterally.  Accessory Clinical Findings  CBC  Recent Labs  05/04/14 0344 05/05/14 0300  WBC 11.6* 8.8  HGB 10.5* 9.8*  HCT 32.8* 30.7*  MCV 89.6 90.8  PLT 248 234    Basic Metabolic Panel  Recent Labs  05/04/14 0344 05/05/14 0300  NA 137 136*  K 3.8 4.3  CL 98 100  CO2 25 24  GLUCOSE 121* 131*  BUN 19 19  CREATININE 0.87 0.91  CALCIUM 8.8 8.9   Cardiac Enzymes  Recent Labs  05/02/14 1145 05/02/14 1831  TROPONINI 4.63* 3.95*   Fasting Lipid Panel  Recent Labs  05/02/14 0920  CHOL 157  HDL 50  LDLCALC 74  TRIG 164*  CHOLHDL 3.1    TELE NSR with HR 60-70s, no significant ventricular ectopy    ECG  NSR with HR 70s, no significant ST-T wave changes  Echocardiogram  Echo 8/16  EF 35% to 40%. Severe hypokinesis of the anterolateral, inferolateral, and apical myocardium, mild to moderate MR, PA peak pressure 42    Radiology/Studies  Dg Chest Port 1 View  05/02/2014   CLINICAL DATA:  Code ST-elevation myocardial infarction.  EXAM: PORTABLE CHEST - 1 VIEW  COMPARISON:  None.  FINDINGS: The lungs are well-aerated. Vascular congestion is noted. Increased interstitial markings raise concern for mild interstitial edema. No pleural effusion or pneumothorax is seen.  The cardiomediastinal silhouette is borderline normal in size.  No acute osseous abnormalities are seen. There is chronic deformity of both humeral heads, worse on the left.  IMPRESSION: Vascular congestion noted. Increased interstitial markings raise concern for mild interstitial edema.   Electronically Signed   By: Roanna Raider M.D.   On: 05/02/2014 03:54    ASSESSMENT AND PLAN  1. NSTEMI, DES x2 to LCx and mid LAD, 80% mid RCA residual  - cath 05/03/2014 severe 3 v disease, 99% mid LCx stenosis s/p DES, 80% mid RCA stenosis, 80-90% prox LAD stenosis  - cath 05/04/2014 DES to prox and mid LAD  - per Dr. Swaziland, long discussion with patient on 8/18, decided to treat 80% RCA medically and PCI management of her LAD  - continue ASA, plavix, lipitor, BB and ACEI  - may consider change metoprolol tartrate 12.5mg  BID to metoprolol succinate 25mg  daily for CHF as well  2.  Acute systolic CHF with EF 35-40%  - continue 20mg  PO lasix  - Echo 8/16  EF 35% to 40%. Severe hypokinesis of the anterolateral, inferolateral, and apical myocardium, mild to moderate MR, PA peak pressure 42  3. HTN 4. DM 5. Hypothyroidism 6. Anemia: hgb trending down from 11.8 down to 9.8, however no obvious source of bleeding, monitor as outpatient 7. Ischemic cardiomyopathy 8. CAD, triple v dx, see #1  Signed, Amedeo Plenty Pager: [phone]  Patient seen, examined. Available data reviewed. Agree with findings, assessment, and plan as outlined by Azalee Course PA-C. The patient was independently interviewed and examined. Her heart is regular rate and rhythm, lungs are clear, and right radial site is clear without significant ecchymoses or hematoma. She has undergone extensive coronary revascularization with stenting of long diffuse severe disease in the LAD and critical stenosis of the left circumflex. She is noted to have systolic congestive heart failure with LVEF 35-40%. Her current medical program was reviewed and this includes long-acting metoprolol succinate, lisinopril, and furosemide for CHF. She is on dual antiplatelet therapy with aspirin and Plavix as well as a statin drug. Recommend case manager evaluation for home health recommendations. Otherwise stable for discharge.  Tonny Bollman, M.D. 05/05/2014 9:56 AM

## 2014-05-05 NOTE — Discharge Instructions (Signed)
Acute Coronary Syndrome °Acute coronary syndrome (ACS) is an urgent problem in which the blood and oxygen supply to the heart is critically deficient. ACS requires hospitalization because one or more coronary arteries may be blocked. °ACS represents a range of conditions including: °· Previous angina that is now unstable, lasts longer, happens at rest, or is more intense. °· A heart attack, with heart muscle cell injury and death. °There are three vital coronary arteries that supply the heart muscle with blood and oxygen so that it can pump blood effectively. If blockages to these arteries develop, blood flow to the heart muscle is reduced. If the heart does not get enough blood, angina may occur as the first warning sign. °SYMPTOMS  °· The most common signs of angina include: °¨ Tightness or squeezing in the chest. °¨ Feeling of heaviness on the chest. °¨ Discomfort in the arms, neck, back, or jaw. °¨ Shortness of breath and nausea. °¨ Cold, wet skin. °· Angina is usually brought on by physical effort or excitement which increase the oxygen needs of the heart. These states increase the blood flow needs of the heart beyond what can be delivered. °· Other symptoms that are not as common include: °¨ Fatigue °¨ Unexplained feelings of nervousness or anxiety °¨ Weakness °¨ Diarrhea °· Sometimes, you may not have noticed any symptoms at all but still suffered a cardiac injury. °TREATMENT  °· Medicines to help discomfort may include nitroglycerin (nitro) in the form of tablets or a spray for rapid relief, or longer-acting forms such as cream, patches, or capsules. (Be aware that there are many side effects and possible interactions with other drugs). °· Other medicines may be used to help the heart pump better. °· Procedures to open blocked arteries including angioplasty or stent placement to keep the arteries open. °· Open heart surgery may be needed when there are many blockages or they are in critical locations that  are best treated with surgery. °HOME CARE INSTRUCTIONS  °· Do not use any tobacco products including cigarettes, chewing tobacco, or electronic cigarettes. °· Take one baby or adult aspirin daily, if your health care provider advises. This helps reduce the risk of a heart attack. °· It is very important that you follow the angina treatment prescribed by your health care provider. Make arrangements for proper follow-up care. °· Eat a heart healthy diet with salt and fat restrictions as advised. °· Regular exercise is good for you as long as it does not cause discomfort. Do not begin any new type of exercise until you check with your health care provider. °· If you are overweight, you should lose weight. °· Try to maintain normal blood lipid levels. °· Keep your blood pressure under control as recommended by your health care provider. °· You should tell your health care provider right away about any increase in the severity or frequency of your chest discomfort or angina attacks. When you have angina, you should stop what you are doing and sit down. This may bring relief in 3 to 5 minutes. If your health care provider has prescribed nitro, take it as directed. °· If your health care provider has given you a follow-up appointment, it is very important to keep that appointment. Not keeping the appointment could result in a chronic or permanent injury, pain, and disability. If there is any problem keeping the appointment, you must call back to this facility for assistance. °SEEK IMMEDIATE MEDICAL CARE IF:  °· You develop nausea, vomiting, or shortness   of breath. °· You feel faint, lightheaded, or pass out. °· Your chest discomfort gets worse. °· You are sweating or experience sudden profound fatigue. °· You do not get relief of your chest pain after 3 doses of nitro. °· Your discomfort lasts longer than 15 minutes. °MAKE SURE YOU:  °· Understand these instructions. °· Will watch your condition. °· Will get help right  away if you are not doing well or get worse. °· Take all medicines as directed by your health care provider. °Document Released: 09/03/2005 Document Revised: 09/08/2013 Document Reviewed: 01/05/2014 °Exit[employer]® Patient Information ©2015 [employer]. This information is not intended to replace advice given to you by your health care provider. Make sure you discuss any questions you have with your health care provider. ° °No driving until cleared by cardiologist. No lifting over 5 lbs for 1 week. No sexual activity for 1 week. Keep procedure site clean & dry. If you notice increased pain, swelling, bleeding or pus, call/return!  You may shower, but no soaking baths/hot tubs/pools for 1 week.  ° ° °

## 2014-05-06 ENCOUNTER — Other Ambulatory Visit: Payer: Self-pay | Admitting: *Deleted

## 2014-05-06 MED ORDER — FUROSEMIDE 20 MG PO TABS: 20.0000 mg | ORAL_TABLET | Freq: Every day | ORAL | Status: DC

## 2014-05-06 MED ORDER — LISINOPRIL 5 MG PO TABS: 5.0000 mg | ORAL_TABLET | Freq: Every day | ORAL | Status: DC

## 2014-05-10 ENCOUNTER — Other Ambulatory Visit: Payer: Self-pay | Admitting: *Deleted

## 2014-05-10 MED ORDER — ATORVASTATIN CALCIUM 40 MG PO TABS: 40.0000 mg | ORAL_TABLET | Freq: Every day | ORAL | Status: DC

## 2014-05-10 MED ORDER — METOPROLOL SUCCINATE ER 25 MG PO TB24: 25.0000 mg | ORAL_TABLET | Freq: Every day | ORAL | Status: DC

## 2014-05-12 ENCOUNTER — Telehealth: Payer: Self-pay | Admitting: Cardiology

## 2014-05-12 LAB — BASIC METABOLIC PANEL
BUN: 25 mg/dL — ABNORMAL HIGH (ref 6–23)
CO2: 24 meq/L (ref 19–32)
Calcium: 9.8 mg/dL (ref 8.4–10.5)
Chloride: 101 mEq/L (ref 96–112)
Creat: 1.09 mg/dL (ref 0.50–1.10)
GLUCOSE: 188 mg/dL — AB (ref 70–99)
Potassium: 4.6 mEq/L (ref 3.5–5.3)
SODIUM: 135 meq/L (ref 135–145)

## 2014-05-12 NOTE — Telephone Encounter (Signed)
Please called,pt woke up shaky and nervous,a little nauseated. Her sugar yesterday was 300,today it is 150.Pt is coming to the lab today,she would like to talk to a nurse while she is here in the building.

## 2014-05-12 NOTE — Telephone Encounter (Signed)
Daughter called again,please give her a call.If you do not have time for her to stop by,please give her a call.

## 2014-05-13 NOTE — Telephone Encounter (Signed)
Returned call to patient's daughter Melinda Lewis.She stated she was upset that no one called her back yesterday.Advised I was out of office and I apologized no one returned her call.Stated mother is better this morning.Stated yesterday 05/12/14 mother's blood sugar over 350.Stated she was weak,nervous and shaky.Stated this morning blood sugar 132 and she is feeling good.Stated she made mother appointment to see PCP Monday 05/17/14.Also stated she has been seeing  commercials on tv about lipitor being a bad drug and is concerned he mother is taking lipitor.Patient is not having any side effects from lipitor.Wants to make sure lipitor is safe for her to take.Advised if she is not having any side effects from lipitor it is safe to take.Daughter stated she wanted to let Dr.Jordan know how much they appreciate all he did for their mother.Stated Dr.Jordan took time and answered all of her families questions.Stated they wanted to thank him.Will let Dr.Jordan know.

## 2014-05-14 NOTE — Telephone Encounter (Signed)
Spoke to patient's daughter Lanae Boast Dr.Jordan advised to continue Lipitor.Advised to keep appointment with Dr.Jordan 06/15/14 at 2:15 pm.Advised to call sooner if needed.

## 2014-06-15 ENCOUNTER — Ambulatory Visit (INDEPENDENT_AMBULATORY_CARE_PROVIDER_SITE_OTHER): Payer: Commercial Managed Care - HMO | Admitting: Cardiology

## 2014-06-15 ENCOUNTER — Encounter: Payer: Self-pay | Admitting: Cardiology

## 2014-06-15 VITALS — BP 140/68 | HR 72 | Ht 62.0 in | Wt 169.7 lb

## 2014-06-15 DIAGNOSIS — I5023 Acute on chronic systolic (congestive) heart failure: Secondary | ICD-10-CM

## 2014-06-15 DIAGNOSIS — I255 Ischemic cardiomyopathy: Secondary | ICD-10-CM

## 2014-06-15 DIAGNOSIS — I214 Non-ST elevation (NSTEMI) myocardial infarction: Secondary | ICD-10-CM

## 2014-06-15 DIAGNOSIS — I509 Heart failure, unspecified: Secondary | ICD-10-CM

## 2014-06-15 DIAGNOSIS — I2510 Atherosclerotic heart disease of native coronary artery without angina pectoris: Secondary | ICD-10-CM

## 2014-06-15 DIAGNOSIS — I10 Essential (primary) hypertension: Secondary | ICD-10-CM | POA: Diagnosis not present

## 2014-06-15 DIAGNOSIS — I2589 Other forms of chronic ischemic heart disease: Secondary | ICD-10-CM

## 2014-06-15 NOTE — Patient Instructions (Signed)
We will check blood work today  Continue your current therapy  I will see you in 2-3 months with lab work and an Echocardiogram

## 2014-06-16 ENCOUNTER — Encounter: Payer: Self-pay | Admitting: Cardiology

## 2014-06-16 ENCOUNTER — Telehealth: Payer: Self-pay | Admitting: Cardiology

## 2014-06-16 DIAGNOSIS — I255 Ischemic cardiomyopathy: Secondary | ICD-10-CM

## 2014-06-16 DIAGNOSIS — D696 Thrombocytopenia, unspecified: Secondary | ICD-10-CM

## 2014-06-16 DIAGNOSIS — I5023 Acute on chronic systolic (congestive) heart failure: Secondary | ICD-10-CM

## 2014-06-16 HISTORY — DX: Acute on chronic systolic (congestive) heart failure: I50.23

## 2014-06-16 HISTORY — DX: Ischemic cardiomyopathy: I25.5

## 2014-06-16 LAB — CBC WITH DIFFERENTIAL/PLATELET
BASOS PCT: 0 % (ref 0–1)
Basophils Absolute: 0 10*3/uL (ref 0.0–0.1)
EOS ABS: 0.2 10*3/uL (ref 0.0–0.7)
Eosinophils Relative: 2 % (ref 0–5)
HEMATOCRIT: 35.1 % — AB (ref 36.0–46.0)
Hemoglobin: 11.4 g/dL — ABNORMAL LOW (ref 12.0–15.0)
Lymphocytes Relative: 54 % — ABNORMAL HIGH (ref 12–46)
Lymphs Abs: 4.2 10*3/uL — ABNORMAL HIGH (ref 0.7–4.0)
MCH: 28.5 pg (ref 26.0–34.0)
MCHC: 32.5 g/dL (ref 30.0–36.0)
MCV: 87.8 fL (ref 78.0–100.0)
MONO ABS: 0.3 10*3/uL (ref 0.1–1.0)
Monocytes Relative: 4 % (ref 3–12)
Neutro Abs: 3.1 10*3/uL (ref 1.7–7.7)
Neutrophils Relative %: 40 % — ABNORMAL LOW (ref 43–77)
Platelets: 46 10*3/uL — ABNORMAL LOW (ref 150–400)
RBC: 4 MIL/uL (ref 3.87–5.11)
RDW: 14.3 % (ref 11.5–15.5)
WBC: 7.7 10*3/uL (ref 4.0–10.5)

## 2014-06-16 LAB — BASIC METABOLIC PANEL
BUN: 27 mg/dL — ABNORMAL HIGH (ref 6–23)
CHLORIDE: 104 meq/L (ref 96–112)
CO2: 17 meq/L — AB (ref 19–32)
CREATININE: 1.02 mg/dL (ref 0.50–1.10)
Calcium: 9.4 mg/dL (ref 8.4–10.5)
GLUCOSE: 187 mg/dL — AB (ref 70–99)
Potassium: 5.4 mEq/L — ABNORMAL HIGH (ref 3.5–5.3)
SODIUM: 136 meq/L (ref 135–145)

## 2014-06-16 NOTE — Progress Notes (Signed)
Melinda Lewis Date of Birth: 1918-05-07 Medical Record #[id number]  History of Present Illness: Melinda Lewis is seen for follow up after recent hospitalization. She is a pleasant 80 yo WF with history of DM type 2, HTN, and hypercholesterolemia who was admitted from 8/16-8/19/15 with a NSTEMI. She presented with acute respiratory failure and was hypertensive. She diuresed well.  By Echo EF was 35-40%. She underwent cardiac cath which demonstrated severe disease in the proximal and mid LAD and mid LCx. The RCA had an 80% stenosis in the mid vessel. She had stenting of the mid LCx, proximal and mid LAD with DES stents. She did well following the procedures. On follow up today she is doing very well. She is seen with her 2 daughters. She still has a cough which is chronic and she relates to sinus drainage. She has chronic arthralgias but doesn't think this has changed on lipitor. No edema. No groin complications. Denies any chest pain.    Medication List       This list is accurate as of: 06/15/14 11:59 PM.  Always use your most recent med list.               aspirin EC 81 MG tablet  Take 81 mg by mouth daily.     atorvastatin 40 MG tablet  Commonly known as:  LIPITOR  Take 1 tablet (40 mg total) by mouth daily at 6 PM.     clopidogrel 75 MG tablet  Commonly known as:  PLAVIX  Take 1 tablet (75 mg total) by mouth daily.     FLAX SEED OIL PO  Take 1 tablet by mouth daily.     furosemide 20 MG tablet  Commonly known as:  LASIX  Take 1 tablet (20 mg total) by mouth daily.     hydrALAZINE 50 MG tablet  Commonly known as:  APRESOLINE  Take 1 tablet by mouth 3 (three) times daily.     L-Arginine 1000 MG Tabs  Take 1 tablet by mouth 3 (three) times daily.     levothyroxine 150 MCG tablet  Commonly known as:  SYNTHROID, LEVOTHROID  Take 150 mcg by mouth daily before breakfast.     lisinopril 5 MG tablet  Commonly known as:  PRINIVIL,ZESTRIL  Take 1 tablet (5 mg total) by mouth  daily.     magnesium oxide 400 MG tablet  Commonly known as:  MAG-OX  Take 400 mg by mouth 2 (two) times daily.     metFORMIN 500 MG tablet  Commonly known as:  GLUCOPHAGE  Take 500 mg by mouth 2 (two) times daily with a meal.     metoprolol succinate 25 MG 24 hr tablet  Commonly known as:  TOPROL-XL  Take 1 tablet (25 mg total) by mouth daily.     pantoprazole 40 MG tablet  Commonly known as:  PROTONIX  Take 40 mg by mouth daily.     QUEtiapine 25 MG tablet  Commonly known as:  SEROQUEL  Take 25 mg by mouth at bedtime.     ramipril 10 MG capsule  Commonly known as:  ALTACE  Take 1 capsule by mouth 2 (two) times daily.     ranitidine 150 MG capsule  Commonly known as:  ZANTAC  Take 1 capsule by mouth daily.         Allergies  Allergen Reactions  . Clonidine Derivatives Other (See Comments)    Maker her pass out  . Epinephrine   .  Penicillins     Past Medical History  Diagnosis Date  . TIA (transient ischemic attack) 2010  . Diabetes mellitus without complication   . Hypertension   . Thyroid disease   . Anemia   . NSTEMI (non-ST elevated myocardial infarction)   . CAD (coronary artery disease) 8/15    s/p DES LCX and LAD     History reviewed. No pertinent past surgical history.  History   Social History  . Marital Status: Widowed    Spouse Name: N/A    Number of Children: N/A  . Years of Education: N/A   Social History Main Topics  . Smoking status: Former Smoker    Types: Cigarettes  . Smokeless tobacco: Never Used  . Alcohol Use: No  . Drug Use: No  . Sexual Activity: None   Other Topics Concern  . None   Social History Narrative  . None    History reviewed. No pertinent family history.  Review of Systems: As noted in HPI.  All other systems were reviewed and are negative.  Physical Exam: BP 140/68  Pulse 72  Ht 5\' 2"  (1.575 m)  Wt 169 lb 11.2 oz (76.975 kg)  BMI 31.03 kg/m2 Filed Weights   06/15/14 1430  Weight: 169 lb 11.2  oz (76.975 kg)  GENERAL:  Well appearing elderly WF in NAD HEENT:  PERRL, EOMI, sclera are clear. Oropharynx is clear. NECK:  No jugular venous distention, carotid upstroke brisk and symmetric, no bruits, no thyromegaly or adenopathy LUNGS:  Clear to auscultation bilaterally CHEST:  Unremarkable HEART:  RRR,  PMI not displaced or sustained,S1 and S2 within normal limits, no S3, no S4: no clicks, no rubs, no murmurs ABD:  Soft, nontender. BS +, no masses or bruits. No hepatomegaly, no splenomegaly EXT:  2 + pulses throughout, no edema, no cyanosis no clubbing. No groin hematoma. SKIN:  Warm and dry.  No rashes NEURO:  Alert and oriented x 3. Cranial nerves II through XII intact. PSYCH:  Cognitively intact    LABORATORY DATA:   Assessment / Plan: 1. CAD with recent NSTEMI. S/p complex stenting of the proximal and mid LAD, Mid LCx with DESs. Moderate RCA disease that will be treated medically. Continue DAPT for one year. Continue metoprolol.   2. Acute on chronic systolic CHF due to ischemic cardiomyopathy. Continue ACEi, metoprolol, lasix. Sodium restriction. Will reassess LV function with Echo in 3 months. Will  Follow up BMET and CBC today.  3. Hypercholesterolemia. On high dose lipitor. Will check fasting lab work in 2-3 months. At her advanced age may want to reduce dose in 6 months.  4. HTN under fair control.   5. DM type 2.

## 2014-06-16 NOTE — Telephone Encounter (Signed)
Received call from Candace at [facility] lab she wanted to let Dr.Jordan know patient's platelets 46.Will tell Dr.Jordan.

## 2014-06-16 NOTE — Telephone Encounter (Signed)
Returned call to patient lab results given.Will check with Dr.Jordan 06/17/14 regarding low platelets.

## 2014-06-16 NOTE — Telephone Encounter (Signed)
Melinda Lewis is calling with an alert ..... °

## 2014-06-16 NOTE — Telephone Encounter (Signed)
Returning your call. °

## 2014-06-17 ENCOUNTER — Ambulatory Visit: Payer: Commercial Managed Care - HMO | Admitting: Cardiology

## 2014-06-17 NOTE — Telephone Encounter (Signed)
Returned call to patient spoke to patient's daughter Melinda Lewis Dr.Jordan advised needs to repeat platelet count with smear.Melinda Lewis stated mother wants to go to a different lab,lab tech had a hard time getting blood.Advised to go to our [employer] 06/18/14.

## 2014-06-18 ENCOUNTER — Other Ambulatory Visit: Payer: Commercial Managed Care - HMO | Admitting: *Deleted

## 2014-06-18 DIAGNOSIS — D696 Thrombocytopenia, unspecified: Secondary | ICD-10-CM

## 2014-06-18 LAB — PLATELET COUNT: Platelets: 303 10*3/uL (ref 150–400)

## 2014-07-14 ENCOUNTER — Other Ambulatory Visit: Payer: Self-pay | Admitting: Cardiology

## 2014-08-03 ENCOUNTER — Telehealth: Payer: Self-pay | Admitting: Cardiology

## 2014-08-03 NOTE — Telephone Encounter (Signed)
Melinda Lewis is calling in stating that she would like to speak to [place] about her mother's upcoming appt. Please call  Thanks

## 2014-08-04 NOTE — Telephone Encounter (Signed)
Returned call to patient's daughter Lanae Boast.She stated she wanted to verify mother's upcoming appointments.Advised she had a echo appt 08/24/14 at 1:00 pm.Appt with Dr.Jordan 08/31/14 at 4:15 pm.Stated mother will have lab work done at PCP in [place].Stated she will have them fax results.

## 2014-08-04 NOTE — Telephone Encounter (Signed)
Please call,says she did not hear anything from you yesterday.

## 2014-08-24 ENCOUNTER — Ambulatory Visit (HOSPITAL_COMMUNITY)
Admission: RE | Admit: 2014-08-24 | Discharge: 2014-08-24 | Disposition: A | Discharge status: Home / Self Care | Payer: Commercial Managed Care - HMO | Source: Ambulatory Visit | Attending: Internal Medicine | Admitting: Internal Medicine

## 2014-08-24 DIAGNOSIS — I2510 Atherosclerotic heart disease of native coronary artery without angina pectoris: Secondary | ICD-10-CM

## 2014-08-24 DIAGNOSIS — I10 Essential (primary) hypertension: Secondary | ICD-10-CM | POA: Diagnosis not present

## 2014-08-24 DIAGNOSIS — I252 Old myocardial infarction: Secondary | ICD-10-CM | POA: Insufficient documentation

## 2014-08-24 DIAGNOSIS — I369 Nonrheumatic tricuspid valve disorder, unspecified: Secondary | ICD-10-CM

## 2014-08-24 DIAGNOSIS — I214 Non-ST elevation (NSTEMI) myocardial infarction: Secondary | ICD-10-CM

## 2014-08-24 NOTE — Progress Notes (Signed)
2D Echo Performed 10/14/2013    Daelon Dunivan, RCS  

## 2014-08-26 ENCOUNTER — Encounter (HOSPITAL_COMMUNITY): Payer: Self-pay | Admitting: Cardiology

## 2014-08-31 ENCOUNTER — Ambulatory Visit (INDEPENDENT_AMBULATORY_CARE_PROVIDER_SITE_OTHER): Payer: Commercial Managed Care - HMO | Admitting: Cardiology

## 2014-08-31 ENCOUNTER — Encounter: Payer: Self-pay | Admitting: Cardiology

## 2014-08-31 VITALS — BP 138/78 | HR 88 | Ht 62.0 in | Wt 168.5 lb

## 2014-08-31 DIAGNOSIS — I2510 Atherosclerotic heart disease of native coronary artery without angina pectoris: Secondary | ICD-10-CM

## 2014-08-31 DIAGNOSIS — I255 Ischemic cardiomyopathy: Secondary | ICD-10-CM

## 2014-08-31 DIAGNOSIS — I10 Essential (primary) hypertension: Secondary | ICD-10-CM

## 2014-08-31 DIAGNOSIS — I214 Non-ST elevation (NSTEMI) myocardial infarction: Secondary | ICD-10-CM

## 2014-08-31 MED ORDER — METOPROLOL SUCCINATE ER 25 MG PO TB24: 25.0000 mg | ORAL_TABLET | Freq: Every day | ORAL | Status: DC

## 2014-08-31 NOTE — Progress Notes (Signed)
Melinda MillersMildred P Lewis Date of Birth: Aug 10, 1918 Medical Record #[id number]  History of Present Illness: [name] is seen for follow up CAD. She is a pleasant 80 yo WF with history of DM type 2, HTN, and hypercholesterolemia who was admitted from 8/16-8/19/15 with a NSTEMI. She presented with acute respiratory failure and was hypertensive.  By Echo EF was 35-40%. She underwent cardiac cath which demonstrated severe disease in the proximal and mid LAD and mid LCx. The RCA had an 80% stenosis in the mid vessel. She had stenting of the mid LCx, proximal and mid LAD with DES stents.  On follow up today she is doing very well. She is seen with her daughter.  She has chronic arthralgias but doesn't think this has changed on lipitor. No edema.  Denies any chest pain or SOB. Labs done in November by primary care.    Medication List       This list is accurate as of: 08/31/14  5:31 PM.  Always use your most recent med list.               aspirin EC 81 MG tablet  Take 81 mg by mouth daily.     atorvastatin 40 MG tablet  Commonly known as:  LIPITOR  Take 1 tablet (40 mg total) by mouth daily at 6 PM.     clopidogrel 75 MG tablet  Commonly known as:  PLAVIX  TAKE ONE TABLET BY MOUTH EVERY DAY     FLAX SEED OIL PO  Take 1 tablet by mouth daily.     furosemide 20 MG tablet  Commonly known as:  LASIX  Take 1 tablet (20 mg total) by mouth daily.     hydrALAZINE 50 MG tablet  Commonly known as:  APRESOLINE  Take 1 tablet by mouth 3 (three) times daily.     L-Arginine 1000 MG Tabs  Take 1 tablet by mouth 3 (three) times daily.     levothyroxine 150 MCG tablet  Commonly known as:  SYNTHROID, LEVOTHROID  Take 150 mcg by mouth daily before breakfast.     lisinopril 5 MG tablet  Commonly known as:  PRINIVIL,ZESTRIL  Take 1 tablet (5 mg total) by mouth daily.     magnesium oxide 400 MG tablet  Commonly known as:  MAG-OX  Take 400 mg by mouth 2 (two) times daily.     metFORMIN 500 MG  tablet  Commonly known as:  GLUCOPHAGE  Take 500 mg by mouth 2 (two) times daily with a meal.     metoprolol succinate 25 MG 24 hr tablet  Commonly known as:  TOPROL-XL  Take 1 tablet (25 mg total) by mouth daily.     pantoprazole 40 MG tablet  Commonly known as:  PROTONIX  Take 40 mg by mouth daily.     QUEtiapine 25 MG tablet  Commonly known as:  SEROQUEL  Take 25 mg by mouth at bedtime.     ramipril 10 MG capsule  Commonly known as:  ALTACE  Take 1 capsule by mouth 2 (two) times daily.     ranitidine 150 MG capsule  Commonly known as:  ZANTAC  Take 1 capsule by mouth daily.         Allergies  Allergen Reactions  . Clonidine Derivatives Other (See Comments)    Maker her pass out  . Epinephrine   . Penicillins     Past Medical History  Diagnosis Date  . TIA (transient ischemic attack) 2010  .  Diabetes mellitus without complication   . Hypertension   . Thyroid disease   . Anemia   . NSTEMI (non-ST elevated myocardial infarction)   . CAD (coronary artery disease) 8/15    s/p DES LCX and LAD   . Acute on chronic systolic CHF (congestive heart failure) 06/16/2014  . Cardiomyopathy, ischemic 06/16/2014    Past Surgical History  Procedure Laterality Date  . Left heart catheterization with coronary angiogram N/A 05/03/2014    Procedure: LEFT HEART CATHETERIZATION WITH CORONARY ANGIOGRAM;  Surgeon: Peter M Swaziland, MD;  Location: [facility] CATH LAB;  Service: Cardiovascular;  Laterality: N/A;  . Percutaneous coronary stent intervention (pci-s) Right 05/03/2014    Procedure: PERCUTANEOUS CORONARY STENT INTERVENTION (PCI-S);  Surgeon: Peter M Swaziland, MD;  Location: [facility] CATH LAB;  Service: Cardiovascular;  Laterality: Right;  . Percutaneous coronary stent intervention (pci-s) N/A 05/04/2014    Procedure: PERCUTANEOUS CORONARY STENT INTERVENTION (PCI-S);  Surgeon: Peter M Swaziland, MD;  Location: [facility] CATH LAB;  Service: Cardiovascular;  Laterality: N/A;    History   Social History    . Marital Status: Widowed    Spouse Name: N/A    Number of Children: N/A  . Years of Education: N/A   Social History Main Topics  . Smoking status: Former Smoker    Types: Cigarettes  . Smokeless tobacco: Never Used  . Alcohol Use: No  . Drug Use: No  . Sexual Activity: None   Other Topics Concern  . None   Social History Narrative    History reviewed. No pertinent family history.  Review of Systems: As noted in HPI.  All other systems were reviewed and are negative.  Physical Exam: BP 138/78 mmHg  Pulse 88  Ht  (1.575 m)  Wt 168 lb 8 oz (76.431 kg)  BMI 30.81 kg/m2 Filed Weights   08/31/14 1624  Weight: 168 lb 8 oz (76.431 kg)  GENERAL:  Well appearing elderly WF in NAD HEENT:  PERRL, EOMI, sclera are clear. Oropharynx is clear. NECK:  No jugular venous distention, carotid upstroke brisk and symmetric, no bruits, no thyromegaly or adenopathy LUNGS:  Clear to auscultation bilaterally CHEST:  Unremarkable HEART:  RRR,  PMI not displaced or sustained,S1 and S2 within normal limits, no S3, no S4: no clicks, no rubs, no murmurs ABD:  Soft, nontender. BS +, no masses or bruits. No hepatomegaly, no splenomegaly EXT:  2 + pulses throughout, no edema, no cyanosis no clubbing. No groin hematoma. SKIN:  Warm and dry.  No rashes NEURO:  Alert and oriented x 3. Cranial nerves II through XII intact. PSYCH:  Cognitively intact    LABORATORY DATA: Echo 08/24/14:Study Conclusions  - Left ventricle: There was mild concentric hypertrophy. Systolic function was vigorous. The estimated ejection fraction was in the range of 65% to 70%. Wall motion was normal; there were no regional wall motion abnormalities. Doppler parameters are consistent with abnormal left ventricular relaxation (grade 1 diastolic dysfunction). Doppler parameters are consistent with elevated ventricular end-diastolic filling pressure. - Aortic valve: Mildly thickened leaflets. There was no  stenosis. - Mitral valve: Mildly thickened leaflets . There was trivial regurgitation. - Left atrium: The atrium was normal in size. - Right ventricle: Systolic function was normal. Systolic pressure was within the normal range. - Tricuspid valve: There was mild regurgitation. - Pulmonic valve: There was no regurgitation. - Pulmonary arteries: Systolic pressure was mildly increased. PA peak pressure: 41 mm Hg (S). - Inferior vena cava: The vessel was normal in  size. The respirophasic diameter changes were in the normal range (= 50%), consistent with normal central venous pressure.  Impressions:  - Normal biventricular size and systolic function. Abnormal LV relaxation with mildly elevated filling pressures. No regional wall motion abnormalities were seen. Mild tricuspid regurgitation with mild pulmonary hypertension.  Transthoracic echocardiography. M-mode, complete 2D, spectral Doppler, and color Doppler. Birthdate: Patient birthdate: Nov 21, 1917. Age: Patient is 80 yr old. Sex: Gender: female. BMI: 30.9 kg/m^2. Blood pressure:   140/68 Patient status: Outpatient. Study date: Study date: 08/24/2014. Study time: 01:06 PM. Location: Echo laboratory.  -------------------------------------------------------------------  ------------------------------------------------------------------- Left ventricle: There was mild concentric hypertrophy. Systolic function was vigorous. The estimated ejection fraction was in the range of 65% to 70%. Wall motion was normal; there were no regional wall motion abnormalities. Doppler parameters are consistent with abnormal left ventricular relaxation (grade 1 diastolic dysfunction). Doppler parameters are consistent with elevated ventricular end-diastolic filling pressure.  ------------------------------------------------------------------- Aortic valve:  Mildly thickened leaflets. Sclerosis without stenosis.  Doppler:  There was no stenosis.  ------------------------------------------------------------------- Mitral valve:  Mildly thickened leaflets . Leaflet separation was normal. Doppler: Transvalvular velocity was within the normal range. There was no evidence for stenosis. There was trivial regurgitation.  ------------------------------------------------------------------- Left atrium: LA Volume/ BSA = 20.8 ml/m2. The atrium was normal in size.  ------------------------------------------------------------------- Right ventricle: The cavity size was normal. Wall thickness was normal. Systolic function was normal. Systolic pressure was within the normal range.  ------------------------------------------------------------------- Pulmonic valve:  Structurally normal valve.  Cusp separation was normal. Doppler: There was no regurgitation.  ------------------------------------------------------------------- Tricuspid valve:  Doppler: There was mild regurgitation.  ------------------------------------------------------------------- Pulmonary artery:  Systolic pressure was mildly increased.  ------------------------------------------------------------------- Systemic veins: Inferior vena cava: The vessel was normal in size. The respirophasic diameter changes were in the normal range (= 50%), consistent with normal central venous pressure. Diameter: 19 mm.   Assessment / Plan: 1. CAD s/p NSTEMI. S/p complex stenting of the proximal and mid LAD, Mid LCx with DESs. Moderate RCA disease that will be treated medically. Continue DAPT for one year. Continue metoprolol.  Follow up in 6 months.  2. Acute on chronic systolic CHF due to ischemic cardiomyopathy. Continue ACEi, metoprolol, lasix. Sodium restriction. Echo recently showed normal LV function- excellent recovery post revascularization.  3. Hypercholesterolemia. On high dose lipitor. Will get a copy of recent lab work.    4. HTN under fair control.   5. DM type 2.

## 2014-08-31 NOTE — Patient Instructions (Signed)
We will switch your Toprol to 25 mg daily  Continue your other therapy  We will get a copy of your lab work from Dr. Judithann Sheen.   I will see you in 6 months.

## 2014-10-01 ENCOUNTER — Telehealth: Payer: Self-pay | Admitting: Cardiology

## 2014-10-01 ENCOUNTER — Encounter: Payer: Self-pay | Admitting: Cardiology

## 2014-10-01 NOTE — Telephone Encounter (Signed)
This encounter was created in error - please disregard.

## 2014-10-01 NOTE — Telephone Encounter (Signed)
Spoke with nurse in dr sparks office, they will call the patient and set up a time for her to come to the office for an EKG. Our fax number was given for them to forward the EKG to [place]. Patient made aware of above, she will call back if she does not hear from dr sparks office.

## 2014-10-01 NOTE — Telephone Encounter (Signed)
Spoke with amy from dr sparks office, they have seen the patient and they feel she has costochondritis. They plan on treating the patient. They are going to fax the EKG for dr jordan's review

## 2014-10-01 NOTE — Telephone Encounter (Signed)
Spoke with pt, for some time now she has had a dull ache over the left breast. The discomfort comes and goes and is not related to exertion. She did wake with the discomfort this morning, currently she is pain free. She denies SOB, when she blew her nose this am she felt sore but cn not reproduce the discomfort with movement or palpitation. This is a different type pain from when she had her MI and stents. She denies stomach upset or belching. Will discuss with dr Swaziland.

## 2014-10-01 NOTE — Telephone Encounter (Signed)
Pt is having dull chest pain right above her left breast. She been having some the last few days>please call asap.

## 2014-10-01 NOTE — Telephone Encounter (Signed)
Discussed with dr Swaziland, Spoke with Melinda Lewis, she needs an EKG today to make sure there are no changes. The patient lives in [place] and would prefer to go to her PCP for an EKG. Left a message for the nurse in dr sparks office to please call.

## 2014-11-10 ENCOUNTER — Encounter: Payer: Self-pay | Admitting: Cardiology

## 2014-12-15 ENCOUNTER — Other Ambulatory Visit: Payer: Self-pay | Admitting: Cardiology

## 2015-02-16 ENCOUNTER — Other Ambulatory Visit: Payer: Self-pay | Admitting: Cardiology

## 2015-02-16 NOTE — Telephone Encounter (Signed)
Rx(s) sent to pharmacy electronically.  

## 2015-03-02 ENCOUNTER — Encounter: Payer: Self-pay | Admitting: *Deleted

## 2015-03-04 ENCOUNTER — Encounter: Payer: Self-pay | Admitting: Cardiology

## 2015-03-04 ENCOUNTER — Ambulatory Visit (INDEPENDENT_AMBULATORY_CARE_PROVIDER_SITE_OTHER): Payer: PPO | Admitting: Cardiology

## 2015-03-04 VITALS — BP 186/80 | HR 85 | Ht 62.0 in | Wt 166.7 lb

## 2015-03-04 DIAGNOSIS — I5022 Chronic systolic (congestive) heart failure: Secondary | ICD-10-CM | POA: Insufficient documentation

## 2015-03-04 DIAGNOSIS — I10 Essential (primary) hypertension: Secondary | ICD-10-CM

## 2015-03-04 DIAGNOSIS — I2510 Atherosclerotic heart disease of native coronary artery without angina pectoris: Secondary | ICD-10-CM | POA: Diagnosis not present

## 2015-03-04 NOTE — Progress Notes (Signed)
Melinda Lewis Date of Birth: [birth year] Medical Record #[id number]  History of Present Illness: Melinda Lewis is seen for follow up CAD. She is a pleasant 81 yo WF with history of DM type 2, HTN, and hypercholesterolemia who was admitted from 8/16-8/19/15 with a NSTEMI. She presented with acute respiratory failure and was hypertensive.  By Echo EF was 35-40%. She underwent cardiac cath which demonstrated severe disease in the proximal and mid LAD and mid LCx. The RCA had an 80% stenosis in the mid vessel. She had stenting of the mid LCx, proximal and mid LAD with DES stents.  On follow up today she is doing very well. She is seen with her daughter.  She notes some minor bruising on her arms. No chest pain or SOB. She remains active going to meetings daily. Labs followed by Dr. Judithann Sheen.     Medication List       This list is accurate as of: 03/04/15  3:06 PM.  Always use your most recent med list.               aspirin EC 81 MG tablet  Take 81 mg by mouth daily.     atorvastatin 40 MG tablet  Commonly known as:  LIPITOR  TAKE ONE TABLET EVERY DAY AT 6PM     clopidogrel 75 MG tablet  Commonly known as:  PLAVIX  Take 1 tablet (75 mg total) by mouth daily.     FLAX SEED OIL PO  Take 1 tablet by mouth daily.     furosemide 20 MG tablet  Commonly known as:  LASIX  Take 1 tablet (20 mg total) by mouth daily.     hydrALAZINE 50 MG tablet  Commonly known as:  APRESOLINE  Take 1 tablet by mouth 3 (three) times daily.     L-Arginine 1000 MG Tabs  Take 1 tablet by mouth 3 (three) times daily.     levothyroxine 150 MCG tablet  Commonly known as:  SYNTHROID, LEVOTHROID  Take 150 mcg by mouth daily before breakfast.     lisinopril 5 MG tablet  Commonly known as:  PRINIVIL,ZESTRIL  Take 1 tablet (5 mg total) by mouth daily.     magnesium oxide 400 MG tablet  Commonly known as:  MAG-OX  Take 400 mg by mouth 2 (two) times daily.     metFORMIN 500 MG tablet  Commonly known as:   GLUCOPHAGE  Take 500 mg by mouth 2 (two) times daily with a meal.     metoprolol succinate 25 MG 24 hr tablet  Commonly known as:  TOPROL-XL  Take 1 tablet (25 mg total) by mouth daily.     pantoprazole 40 MG tablet  Commonly known as:  PROTONIX  Take 40 mg by mouth daily.     QUEtiapine 25 MG tablet  Commonly known as:  SEROQUEL  Take 25 mg by mouth at bedtime.     ramipril 10 MG capsule  Commonly known as:  ALTACE  Take 1 capsule by mouth 2 (two) times daily.     ranitidine 150 MG capsule  Commonly known as:  ZANTAC  Take 1 capsule by mouth daily.         Allergies  Allergen Reactions  . Clonidine Derivatives Other (See Comments)    Maker her pass out  . Epinephrine   . Penicillins     Past Medical History  Diagnosis Date  . TIA (transient ischemic attack) 2010  . Diabetes mellitus without  complication   . Hypertension   . Thyroid disease   . Anemia   . NSTEMI (non-ST elevated myocardial infarction)   . CAD (coronary artery disease) 8/15    s/p DES LCX and LAD   . Acute on chronic systolic CHF (congestive heart failure) 06/16/2014  . Cardiomyopathy, ischemic 06/16/2014    Past Surgical History  Procedure Laterality Date  . Left heart catheterization with coronary angiogram N/A 05/03/2014    Procedure: LEFT HEART CATHETERIZATION WITH CORONARY ANGIOGRAM;  Surgeon: Peter M Swaziland, MD;  Location: [facility] CATH LAB;  Service: Cardiovascular;  Laterality: N/A;  . Percutaneous coronary stent intervention (pci-s) Right 05/03/2014    Procedure: PERCUTANEOUS CORONARY STENT INTERVENTION (PCI-S);  Surgeon: Peter M Swaziland, MD;  Location: [facility] CATH LAB;  Service: Cardiovascular;  Laterality: Right;  . Percutaneous coronary stent intervention (pci-s) N/A 05/04/2014    Procedure: PERCUTANEOUS CORONARY STENT INTERVENTION (PCI-S);  Surgeon: Peter M Swaziland, MD;  Location: [facility] CATH LAB;  Service: Cardiovascular;  Laterality: N/A;    History   Social History  . Marital Status: Widowed     Spouse Name: N/A  . Number of Children: N/A  . Years of Education: N/A   Social History Main Topics  . Smoking status: Former Smoker    Types: Cigarettes  . Smokeless tobacco: Never Used  . Alcohol Use: No  . Drug Use: No  . Sexual Activity: Not on file   Other Topics Concern  . None   Social History Narrative    History reviewed. No pertinent family history.  Review of Systems: As noted in HPI.  All other systems were reviewed and are negative.  Physical Exam: BP 186/80 mmHg  Pulse 85  Ht  (1.575 m)  Wt 75.615 kg (166 lb 11.2 oz)  BMI 30.48 kg/m2 Filed Weights   03/04/15 1429  Weight: 75.615 kg (166 lb 11.2 oz)  GENERAL:  Well appearing elderly WF in NAD HEENT:  PERRL, EOMI, sclera are clear. Oropharynx is clear. NECK:  No jugular venous distention, carotid upstroke brisk and symmetric, no bruits, no thyromegaly or adenopathy LUNGS:  Clear to auscultation bilaterally CHEST:  Unremarkable HEART:  RRR,  PMI not displaced or sustained,S1 and S2 within normal limits, no S3, no S4: no clicks, no rubs, no murmurs ABD:  Soft, nontender. BS +, no masses or bruits. No hepatomegaly, no splenomegaly EXT:  2 + pulses throughout, no edema, no cyanosis no clubbing.  SKIN:  Warm and dry.  No rashes. Minor bruising. NEURO:  Alert and oriented x 3. Cranial nerves II through XII intact. PSYCH:  Cognitively intact    LABORATORY DATA:   Assessment / Plan: 1. CAD s/p NSTEMI. S/p complex stenting of the proximal and mid LAD, Mid LCx with DESs in August 2015. Moderate RCA disease that will be treated medically. Continue DAPT for one year. Given extent of stents I would favor long term DAPT unless she has bleeding problems. Follow up in 6 months.  2. Chronic systolic CHF due to ischemic cardiomyopathy. Continue ACEi, metoprolol, lasix. Sodium restriction. Echo in Dec.  showed normal LV function- excellent recovery post revascularization.  3. Hypercholesterolemia. On high dose  lipitor. Will get a copy of recent lab work.   4. HTN BP elevated today but she got upset and had an argument with her daughter prior to her visit. She reports her BP is usually OK.   5. DM type 2.

## 2015-03-04 NOTE — Patient Instructions (Signed)
We will get a copy of your lab work from Dr. Judithann Sheen.  Continue your current therapy  I will see you in 6 months.

## 2015-03-07 ENCOUNTER — Encounter: Payer: Self-pay | Admitting: Cardiology

## 2015-04-18 ENCOUNTER — Other Ambulatory Visit: Payer: Self-pay | Admitting: Cardiology

## 2015-07-14 ENCOUNTER — Other Ambulatory Visit: Payer: Self-pay | Admitting: Cardiology

## 2015-08-25 ENCOUNTER — Telehealth: Payer: Self-pay

## 2015-08-25 NOTE — Telephone Encounter (Signed)
Received a message daughter called wanting to change mother's appointment time 08/31/15.Shearin daughter called no answer.LMTC.

## 2015-08-25 NOTE — Telephone Encounter (Signed)
Received call back from patient's daughter Melinda Lewis.Appointment 08/31/15 with Dr.Jordan changed to 11:45 am.She stated mother has a hard time getting ready early in mornings.

## 2015-08-31 ENCOUNTER — Encounter: Payer: Self-pay | Admitting: Cardiology

## 2015-08-31 ENCOUNTER — Ambulatory Visit: Payer: PPO | Admitting: Cardiology

## 2015-08-31 ENCOUNTER — Ambulatory Visit (INDEPENDENT_AMBULATORY_CARE_PROVIDER_SITE_OTHER): Payer: PPO | Admitting: Cardiology

## 2015-08-31 VITALS — BP 140/86 | HR 82 | Ht 62.0 in | Wt 166.7 lb

## 2015-08-31 DIAGNOSIS — I2510 Atherosclerotic heart disease of native coronary artery without angina pectoris: Secondary | ICD-10-CM | POA: Diagnosis not present

## 2015-08-31 DIAGNOSIS — I5022 Chronic systolic (congestive) heart failure: Secondary | ICD-10-CM

## 2015-08-31 DIAGNOSIS — I10 Essential (primary) hypertension: Secondary | ICD-10-CM

## 2015-08-31 NOTE — Patient Instructions (Signed)
Continue your current therapy  I will see you in 6 months.   

## 2015-08-31 NOTE — Progress Notes (Signed)
Melinda Lewis Date of Birth: [birth year] Medical Record #[id number]  History of Present Illness: Melinda Lewis is seen for follow up CAD. She is a pleasant 81 yo WF with history of DM type 2, HTN, and hypercholesterolemia who was admitted from 8/16-8/19/15 with a NSTEMI. She presented with acute respiratory failure and was hypertensive.  By Echo EF was 35-40%. She underwent cardiac cath which demonstrated severe disease in the proximal and mid LAD and mid LCx. The RCA had an 80% stenosis in the mid vessel. She had stenting of the mid LCx, proximal and mid LAD with DES stents. Repeat Echo in Dec. 2015 showed normal EF.   On follow up today she is doing very well. She is seen with her daughter.   No chest pain or SOB. She complains mostly of severe left knee pain related to arthritis. This limits her activity. She put an ACE bandage around her knee but this resulted in swelling in her left lower leg.     Medication List       This list is accurate as of: 08/31/15  1:06 PM.  Always use your most recent med list.               aspirin EC 81 MG tablet  Take 81 mg by mouth daily.     atorvastatin 40 MG tablet  Commonly known as:  LIPITOR  TAKE ONE TABLET EVERY DAY AT 6PM     clopidogrel 75 MG tablet  Commonly known as:  PLAVIX  Take 1 tablet (75 mg total) by mouth daily.     FLAX SEED OIL PO  Take 1 tablet by mouth daily.     furosemide 20 MG tablet  Commonly known as:  LASIX  TAKE ONE TABLET EVERY DAY     hydrALAZINE 50 MG tablet  Commonly known as:  APRESOLINE  Take 1 tablet by mouth 3 (three) times daily.     L-Arginine 1000 MG Tabs  Take 1 tablet by mouth 3 (three) times daily.     levothyroxine 150 MCG tablet  Commonly known as:  SYNTHROID, LEVOTHROID  Take 150 mcg by mouth daily before breakfast.     lisinopril 5 MG tablet  Commonly known as:  PRINIVIL,ZESTRIL  TAKE ONE TABLET EVERY DAY     magnesium oxide 400 MG tablet  Commonly known as:  MAG-OX  Take 400 mg  by mouth 2 (two) times daily.     metFORMIN 500 MG tablet  Commonly known as:  GLUCOPHAGE  Take 500 mg by mouth 2 (two) times daily with a meal.     metoprolol succinate 25 MG 24 hr tablet  Commonly known as:  TOPROL-XL  Take 1 tablet (25 mg total) by mouth daily.     pantoprazole 40 MG tablet  Commonly known as:  PROTONIX  Take 40 mg by mouth daily.     QUEtiapine 25 MG tablet  Commonly known as:  SEROQUEL  Take 25 mg by mouth at bedtime.     ramipril 10 MG capsule  Commonly known as:  ALTACE  Take 1 capsule by mouth 2 (two) times daily.     ranitidine 150 MG capsule  Commonly known as:  ZANTAC  Take 1 capsule by mouth daily.         Allergies  Allergen Reactions  . Clonidine Derivatives Other (See Comments)    Maker her pass out  . Epinephrine   . Penicillins     Past Medical History  Diagnosis Date  . TIA (transient ischemic attack) 2010  . Diabetes mellitus without complication (HCC)   . Hypertension   . Thyroid disease   . Anemia   . NSTEMI (non-ST elevated myocardial infarction) (HCC)   . CAD (coronary artery disease) 8/15    s/p DES LCX and LAD   . Acute on chronic systolic CHF (congestive heart failure) (HCC) 06/16/2014  . Cardiomyopathy, ischemic 06/16/2014    Past Surgical History  Procedure Laterality Date  . Left heart catheterization with coronary angiogram N/A 05/03/2014    Procedure: LEFT HEART CATHETERIZATION WITH CORONARY ANGIOGRAM;  Surgeon: Peter M Swaziland, MD;  Location: [facility] CATH LAB;  Service: Cardiovascular;  Laterality: N/A;  . Percutaneous coronary stent intervention (pci-s) Right 05/03/2014    Procedure: PERCUTANEOUS CORONARY STENT INTERVENTION (PCI-S);  Surgeon: Peter M Swaziland, MD;  Location: [facility] CATH LAB;  Service: Cardiovascular;  Laterality: Right;  . Percutaneous coronary stent intervention (pci-s) N/A 05/04/2014    Procedure: PERCUTANEOUS CORONARY STENT INTERVENTION (PCI-S);  Surgeon: Peter M Swaziland, MD;  Location: [facility] CATH LAB;   Service: Cardiovascular;  Laterality: N/A;    Social History   Social History  . Marital Status: Widowed    Spouse Name: N/A  . Number of Children: N/A  . Years of Education: N/A   Social History Main Topics  . Smoking status: Former Smoker    Types: Cigarettes  . Smokeless tobacco: Never Used  . Alcohol Use: No  . Drug Use: No  . Sexual Activity: Not Asked   Other Topics Concern  . None   Social History Narrative    History reviewed. No pertinent family history.  Review of Systems: As noted in HPI.  All other systems were reviewed and are negative.  Physical Exam: BP 140/86 mmHg  Pulse 82  Ht 5\' 2"  (1.575 m)  Wt 75.615 kg (166 lb 11.2 oz)  BMI 30.48 kg/m2 Filed Weights   08/31/15 1150  Weight: 75.615 kg (166 lb 11.2 oz)  GENERAL:  Well appearing elderly WF in NAD HEENT:  PERRL, EOMI, sclera are clear. Oropharynx is clear. NECK:  No jugular venous distention, carotid upstroke brisk and symmetric, no bruits, no thyromegaly or adenopathy LUNGS:  Clear to auscultation bilaterally CHEST:  Unremarkable HEART:  RRR,  PMI not displaced or sustained,S1 and S2 within normal limits, no S3, no S4: no clicks, no rubs, no murmurs ABD:  Soft, nontender. BS +, no masses or bruits. No hepatomegaly, no splenomegaly EXT:  2 + pulses throughout, no edema, no cyanosis no clubbing.  SKIN:  Warm and dry.  No rashes. Minor bruising. NEURO:  Alert and oriented x 3. Cranial nerves II through XII intact. PSYCH:  Cognitively intact    LABORATORY DATA: Labs from primary care dated 03/02/15: WBC 10.4, Hgb 11.9, BUN 29, creatinine 1.2. Triglycerides 233, HDL 41, LDL 98. A1c 7.5%.  Assessment / Plan: 1. CAD s/p NSTEMI. S/p complex stenting of the proximal and mid LAD, Mid LCx with DESs in August 2015. Moderate RCA disease that will be treated medically. Continue DAPT for one year. Given extent of stents I would favor long term DAPT unless she has bleeding problems. Follow up in 6  months.  2. Chronic systolic CHF due to ischemic cardiomyopathy. Continue ACEi, metoprolol, lasix. Sodium restriction. Echo in Dec. 2015  showed normal LV function.  3. Hypercholesterolemia. On high dose lipitor.   4. HTN controlled.  5. DM type 2.

## 2015-09-29 DIAGNOSIS — J209 Acute bronchitis, unspecified: Secondary | ICD-10-CM | POA: Diagnosis not present

## 2015-10-07 DIAGNOSIS — R5381 Other malaise: Secondary | ICD-10-CM | POA: Diagnosis not present

## 2015-10-07 DIAGNOSIS — R5383 Other fatigue: Secondary | ICD-10-CM | POA: Diagnosis not present

## 2015-10-07 DIAGNOSIS — R110 Nausea: Secondary | ICD-10-CM | POA: Diagnosis not present

## 2015-10-07 DIAGNOSIS — R197 Diarrhea, unspecified: Secondary | ICD-10-CM | POA: Diagnosis not present

## 2015-10-11 DIAGNOSIS — R197 Diarrhea, unspecified: Secondary | ICD-10-CM | POA: Diagnosis not present

## 2015-10-11 DIAGNOSIS — R110 Nausea: Secondary | ICD-10-CM | POA: Diagnosis not present

## 2015-10-20 DIAGNOSIS — E119 Type 2 diabetes mellitus without complications: Secondary | ICD-10-CM | POA: Diagnosis not present

## 2015-10-20 DIAGNOSIS — Z79899 Other long term (current) drug therapy: Secondary | ICD-10-CM | POA: Diagnosis not present

## 2015-10-20 DIAGNOSIS — D649 Anemia, unspecified: Secondary | ICD-10-CM | POA: Diagnosis not present

## 2015-10-20 DIAGNOSIS — I10 Essential (primary) hypertension: Secondary | ICD-10-CM | POA: Diagnosis not present

## 2015-10-20 DIAGNOSIS — E039 Hypothyroidism, unspecified: Secondary | ICD-10-CM | POA: Diagnosis not present

## 2015-11-18 ENCOUNTER — Other Ambulatory Visit: Payer: Self-pay | Admitting: Cardiology

## 2015-11-18 MED ORDER — METOPROLOL SUCCINATE ER 25 MG PO TB24: 25.0000 mg | ORAL_TABLET | Freq: Every day | ORAL | Status: DC

## 2015-12-23 DIAGNOSIS — H903 Sensorineural hearing loss, bilateral: Secondary | ICD-10-CM | POA: Diagnosis not present

## 2016-01-03 DIAGNOSIS — R05 Cough: Secondary | ICD-10-CM | POA: Diagnosis not present

## 2016-01-13 ENCOUNTER — Other Ambulatory Visit: Payer: Self-pay | Admitting: Cardiology

## 2016-01-13 ENCOUNTER — Other Ambulatory Visit: Payer: Self-pay

## 2016-01-13 MED ORDER — CLOPIDOGREL BISULFATE 75 MG PO TABS: 75.0000 mg | ORAL_TABLET | Freq: Every day | ORAL | Status: DC

## 2016-01-13 NOTE — Telephone Encounter (Signed)
Rx(s) sent to pharmacy electronically.  

## 2016-01-24 ENCOUNTER — Encounter: Payer: Self-pay | Admitting: Emergency Medicine

## 2016-01-24 ENCOUNTER — Emergency Department
Admission: EM | Admit: 2016-01-24 | Discharge: 2016-01-24 | Disposition: A | Discharge status: Home / Self Care | Payer: PPO | Attending: Emergency Medicine | Admitting: Emergency Medicine

## 2016-01-24 DIAGNOSIS — Z87891 Personal history of nicotine dependence: Secondary | ICD-10-CM | POA: Diagnosis not present

## 2016-01-24 DIAGNOSIS — I110 Hypertensive heart disease with heart failure: Secondary | ICD-10-CM | POA: Diagnosis not present

## 2016-01-24 DIAGNOSIS — I5022 Chronic systolic (congestive) heart failure: Secondary | ICD-10-CM | POA: Diagnosis not present

## 2016-01-24 DIAGNOSIS — I2510 Atherosclerotic heart disease of native coronary artery without angina pectoris: Secondary | ICD-10-CM | POA: Insufficient documentation

## 2016-01-24 DIAGNOSIS — I255 Ischemic cardiomyopathy: Secondary | ICD-10-CM | POA: Insufficient documentation

## 2016-01-24 DIAGNOSIS — E039 Hypothyroidism, unspecified: Secondary | ICD-10-CM | POA: Insufficient documentation

## 2016-01-24 DIAGNOSIS — I252 Old myocardial infarction: Secondary | ICD-10-CM | POA: Insufficient documentation

## 2016-01-24 DIAGNOSIS — Z7984 Long term (current) use of oral hypoglycemic drugs: Secondary | ICD-10-CM | POA: Diagnosis not present

## 2016-01-24 DIAGNOSIS — Z8673 Personal history of transient ischemic attack (TIA), and cerebral infarction without residual deficits: Secondary | ICD-10-CM | POA: Diagnosis not present

## 2016-01-24 DIAGNOSIS — Z7982 Long term (current) use of aspirin: Secondary | ICD-10-CM | POA: Diagnosis not present

## 2016-01-24 DIAGNOSIS — E119 Type 2 diabetes mellitus without complications: Secondary | ICD-10-CM | POA: Insufficient documentation

## 2016-01-24 DIAGNOSIS — I10 Essential (primary) hypertension: Secondary | ICD-10-CM

## 2016-01-24 DIAGNOSIS — Z79899 Other long term (current) drug therapy: Secondary | ICD-10-CM | POA: Insufficient documentation

## 2016-01-24 NOTE — ED Notes (Signed)
Pt states hypertension since yesterday without symptoms.  Pt is A&Ox4, speaking in complete and coherent sentences and in NAD at this time.

## 2016-01-24 NOTE — Discharge Instructions (Signed)
Hypertension Hypertension, commonly called high blood pressure, is when the force of blood pumping through your arteries is too strong. Your arteries are the blood vessels that carry blood from your heart throughout your body. A blood pressure reading consists of a higher number over a lower number, such as 110/72. The higher number (systolic) is the pressure inside your arteries when your heart pumps. The lower number (diastolic) is the pressure inside your arteries when your heart relaxes. Ideally you want your blood pressure below 120/80. Hypertension forces your heart to work harder to pump blood. Your arteries may become narrow or stiff. Having untreated or uncontrolled hypertension can cause heart attack, stroke, kidney disease, and other problems. RISK FACTORS Some risk factors for high blood pressure are controllable. Others are not.  Risk factors you cannot control include:   Race. You may be at higher risk if you are African American.  Age. Risk increases with age.  Gender. Men are at higher risk than women before age 45 years. After age 65, women are at higher risk than men. Risk factors you can control include:  Not getting enough exercise or physical activity.  Being overweight.  Getting too much fat, sugar, calories, or salt in your diet.  Drinking too much alcohol. SIGNS AND SYMPTOMS Hypertension does not usually cause signs or symptoms. Extremely high blood pressure (hypertensive crisis) may cause headache, anxiety, shortness of breath, and nosebleed. DIAGNOSIS To check if you have hypertension, your health care provider will measure your blood pressure while you are seated, with your arm held at the level of your heart. It should be measured at least twice using the same arm. Certain conditions can cause a difference in blood pressure between your right and left arms. A blood pressure reading that is higher than normal on one occasion does not mean that you need treatment. If  it is not clear whether you have high blood pressure, you may be asked to return on a different day to have your blood pressure checked again. Or, you may be asked to monitor your blood pressure at home for 1 or more weeks. TREATMENT Treating high blood pressure includes making lifestyle changes and possibly taking medicine. Living a healthy lifestyle can help lower high blood pressure. You may need to change some of your habits. Lifestyle changes may include:  Following the DASH diet. This diet is high in fruits, vegetables, and whole grains. It is low in salt, red meat, and added sugars.  Keep your sodium intake below 2,300 mg per day.  Getting at least 30-45 minutes of aerobic exercise at least 4 times per week.  Losing weight if necessary.  Not smoking.  Limiting alcoholic beverages.  Learning ways to reduce stress. Your health care provider may prescribe medicine if lifestyle changes are not enough to get your blood pressure under control, and if one of the following is true:  You are 18-59 years of age and your systolic blood pressure is above 140.  You are 60 years of age or older, and your systolic blood pressure is above 150.  Your diastolic blood pressure is above 90.  You have diabetes, and your systolic blood pressure is over 140 or your diastolic blood pressure is over 90.  You have kidney disease and your blood pressure is above 140/90.  You have heart disease and your blood pressure is above 140/90. Your personal target blood pressure may vary depending on your medical conditions, your age, and other factors. HOME CARE INSTRUCTIONS    Have your blood pressure rechecked as directed by your health care provider.   Take medicines only as directed by your health care provider. Follow the directions carefully. Blood pressure medicines must be taken as prescribed. The medicine does not work as well when you skip doses. Skipping doses also puts you at risk for  problems.  Do not smoke.   Monitor your blood pressure at home as directed by your health care provider. SEEK MEDICAL CARE IF:   You think you are having a reaction to medicines taken.  You have recurrent headaches or feel dizzy.  You have swelling in your ankles.  You have trouble with your vision. SEEK IMMEDIATE MEDICAL CARE IF:  You develop a severe headache or confusion.  You have unusual weakness, numbness, or feel faint.  You have severe chest or abdominal pain.  You vomit repeatedly.  You have trouble breathing. MAKE SURE YOU:   Understand these instructions.  Will watch your condition.  Will get help right away if you are not doing well or get worse.   This information is not intended to replace advice given to you by your health care provider. Make sure you discuss any questions you have with your health care provider.   Document Released: 09/03/2005 Document Revised: 01/18/2015 Document Reviewed: 06/26/2013 Elsevier Interactive Patient Education 2016 [employer]  

## 2016-01-24 NOTE — ED Provider Notes (Addendum)
[facility] Emergency Department Provider Note        Time seen: ----------------------------------------- 8:48 AM on 01/24/2016 -----------------------------------------    I have reviewed the triage vital signs and the nursing notes.   HISTORY  Chief Complaint Hypertension    HPI Melinda Lewis is a 82 y.o. female who presents the ER without any symptoms for elevated blood pressure. She went to the chiropractor yesterday and they told her her blood pressure was high so she went to [facility] this morning to have it checked and it was elevated. She was sent to the ER for evaluation without any symptoms.   Past Medical History  Diagnosis Date  . TIA (transient ischemic attack) 2010  . Diabetes mellitus without complication (HCC)   . Hypertension   . Thyroid disease   . Anemia   . NSTEMI (non-ST elevated myocardial infarction) (HCC)   . CAD (coronary artery disease) 8/15    s/p DES LCX and LAD   . Acute on chronic systolic CHF (congestive heart failure) (HCC) 06/16/2014  . Cardiomyopathy, ischemic 06/16/2014    Patient Active Problem List   Diagnosis Date Noted  . Chronic systolic CHF (congestive heart failure) (HCC) 03/04/2015  . Acute on chronic systolic CHF (congestive heart failure) (HCC) 06/16/2014  . Cardiomyopathy, ischemic 06/16/2014  . CAD, multiple vessel 06/15/2014  . Anemia 05/05/2014  . Hypothyroidism 05/05/2014  . 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/05/2014  . NSTEMI (non-ST elevated myocardial infarction) (HCC) 05/02/2014  . Hypertension 05/02/2014  . Pulmonary edema cardiac cause (HCC) 05/02/2014    Past Surgical History  Procedure Laterality Date  . Left heart catheterization with coronary angiogram N/A 05/03/2014    Procedure: LEFT HEART CATHETERIZATION WITH CORONARY ANGIOGRAM;  Surgeon: Peter M Swaziland, MD;  Location[facility] CATH LAB;  Service: Cardiovascular;  Laterality: N/A;  . Percutaneous coronary stent  intervention (pci-s) Right 05/03/2014    Procedure: PERCUTANEOUS CORONARY STENT INTERVENTION (PCI-S);  Surgeon: Peter M Swaziland, MD;  Location: [facility] CATH LAB;  Service: Cardiovascular;  Laterality: Right;  . Percutaneous coronary stent intervention (pci-s) N/A 05/04/2014    Procedure: PERCUTANEOUS CORONARY STENT INTERVENTION (PCI-S);  Surgeon: Peter M Swaziland, MD;  Location: [facility] CATH LAB;  Service: Cardiovascular;  Laterality: N/A;    Allergies Clonidine derivatives; Epinephrine; and Penicillins  Social History Social History  Substance Use Topics  . Smoking status: Former Smoker    Types: Cigarettes  . Smokeless tobacco: Never Used  . Alcohol Use: No    Review of Systems Constitutional: Negative for fever. Eyes: Negative for visual changes. ENT: Negative for sore throat. Cardiovascular: Negative for chest pain. Respiratory: Negative for shortness of breath. Gastrointestinal: Negative for abdominal pain, vomiting and diarrhea. Genitourinary: Negative for dysuria. Musculoskeletal: Negative for back pain. Skin: Negative for rash. Neurological: Negative for headaches, focal weakness or numbness.  10-point ROS otherwise negative.  ____________________________________________   PHYSICAL EXAM:  VITAL SIGNS: ED Triage Vitals  Enc Vitals Group     BP --      Pulse --      Resp --      Temp --      Temp src --      SpO2 --      Weight --      Height --      Head Cir --      Peak Flow --      Pain Score --      Pain Loc --  Pain Edu? --      Excl. in GC? --     Constitutional: Alert and oriented. Well appearing and in no distress. Eyes: Conjunctivae are normal. PERRL. Normal extraocular movements. ENT   Head: Normocephalic and atraumatic.   Nose: No congestion/rhinnorhea.   Mouth/Throat: Mucous membranes are moist.   Neck: No stridor. Cardiovascular: Normal rate, regular rhythm. No murmurs, rubs, or gallops. Respiratory: Normal respiratory effort without  tachypnea nor retractions. Breath sounds are clear and equal bilaterally. No wheezes/rales/rhonchi. Musculoskeletal: Nontender with normal range of motion in all extremities. No lower extremity tenderness nor edema. Neurologic:  Normal speech and language. No gross focal neurologic deficits are appreciated.  Skin:  Skin is warm, dry and intact. No rash noted. Psychiatric: Mood and affect are normal. Speech and behavior are normal.  ____________________________________________  ED COURSE:  Pertinent labs & imaging results that were available during my care of the patient were reviewed by me and considered in my medical decision making (see chart for details). Patient presents to ER without any symptoms and in no acute distress here. Blood pressure is elevated but has never been elevated prior to the last 24 hours. There are no indications for acute intervention as she has no symptoms. I will advise follow-up with next week as scheduled with her doctor. EKG: Interpreted by me, normal sinus rhythm with a rate 81 bpm, normal PR interval, normal QRS, normal QT interval. Normal axis. No evidence of acute infarction ____________________________________________  FINAL ASSESSMENT AND PLAN  Hypertension  Plan: Patient with a symptomatically hypertension. Advised taking her home medicines and following up with her doctor next week as scheduled.   Emily Filbert, MD   Note: This dictation was prepared with Dragon dictation. Any transcriptional errors that result from this process are unintentional   Emily Filbert, MD 01/24/16 [id number]  Emily Filbert, MD 01/24/16 [phone]

## 2016-01-24 NOTE — ED Notes (Signed)
Reports went to chiropractor yesterday and they told her her bp was high, went to [facility] this am to get it checked and it was 214/80.  Denies sx.

## 2016-01-25 ENCOUNTER — Other Ambulatory Visit: Payer: Self-pay | Admitting: *Deleted

## 2016-01-25 DIAGNOSIS — Z79899 Other long term (current) drug therapy: Secondary | ICD-10-CM | POA: Diagnosis not present

## 2016-01-25 DIAGNOSIS — E119 Type 2 diabetes mellitus without complications: Secondary | ICD-10-CM | POA: Diagnosis not present

## 2016-01-25 DIAGNOSIS — I10 Essential (primary) hypertension: Secondary | ICD-10-CM | POA: Diagnosis not present

## 2016-01-25 DIAGNOSIS — E039 Hypothyroidism, unspecified: Secondary | ICD-10-CM | POA: Diagnosis not present

## 2016-01-25 MED ORDER — LISINOPRIL 5 MG PO TABS: 5.0000 mg | ORAL_TABLET | Freq: Every day | ORAL | Status: DC

## 2016-01-26 DIAGNOSIS — E039 Hypothyroidism, unspecified: Secondary | ICD-10-CM | POA: Diagnosis not present

## 2016-01-26 DIAGNOSIS — E119 Type 2 diabetes mellitus without complications: Secondary | ICD-10-CM | POA: Diagnosis not present

## 2016-01-26 DIAGNOSIS — G894 Chronic pain syndrome: Secondary | ICD-10-CM | POA: Diagnosis not present

## 2016-01-26 DIAGNOSIS — E7800 Pure hypercholesterolemia, unspecified: Secondary | ICD-10-CM | POA: Diagnosis not present

## 2016-01-26 DIAGNOSIS — I10 Essential (primary) hypertension: Secondary | ICD-10-CM | POA: Diagnosis not present

## 2016-01-26 DIAGNOSIS — I739 Peripheral vascular disease, unspecified: Secondary | ICD-10-CM | POA: Diagnosis not present

## 2016-02-08 ENCOUNTER — Other Ambulatory Visit: Payer: Self-pay

## 2016-02-08 ENCOUNTER — Other Ambulatory Visit: Payer: Self-pay | Admitting: Cardiology

## 2016-02-08 MED ORDER — METOPROLOL SUCCINATE ER 25 MG PO TB24: 25.0000 mg | ORAL_TABLET | Freq: Every day | ORAL | Status: DC

## 2016-02-10 ENCOUNTER — Other Ambulatory Visit: Payer: Self-pay | Admitting: Cardiology

## 2016-02-10 ENCOUNTER — Other Ambulatory Visit: Payer: Self-pay | Admitting: *Deleted

## 2016-02-10 MED ORDER — METOPROLOL SUCCINATE ER 25 MG PO TB24: 25.0000 mg | ORAL_TABLET | Freq: Every day | ORAL | Status: DC

## 2016-02-10 MED ORDER — FUROSEMIDE 20 MG PO TABS: 20.0000 mg | ORAL_TABLET | Freq: Every day | ORAL | Status: DC

## 2016-02-10 NOTE — Telephone Encounter (Signed)
Rx(s) sent to pharmacy electronically.  

## 2016-02-27 ENCOUNTER — Ambulatory Visit (INDEPENDENT_AMBULATORY_CARE_PROVIDER_SITE_OTHER): Payer: PPO | Admitting: Cardiology

## 2016-02-27 ENCOUNTER — Encounter: Payer: Self-pay | Admitting: Cardiology

## 2016-02-27 VITALS — BP 144/62 | HR 60 | Ht 62.0 in | Wt 159.2 lb

## 2016-02-27 DIAGNOSIS — I10 Essential (primary) hypertension: Secondary | ICD-10-CM

## 2016-02-27 DIAGNOSIS — I5022 Chronic systolic (congestive) heart failure: Secondary | ICD-10-CM

## 2016-02-27 DIAGNOSIS — I2510 Atherosclerotic heart disease of native coronary artery without angina pectoris: Secondary | ICD-10-CM | POA: Diagnosis not present

## 2016-02-27 MED ORDER — METOPROLOL SUCCINATE ER 25 MG PO TB24: 25.0000 mg | ORAL_TABLET | Freq: Every day | ORAL | Status: DC

## 2016-02-27 MED ORDER — LISINOPRIL 5 MG PO TABS: 5.0000 mg | ORAL_TABLET | Freq: Every day | ORAL | Status: DC

## 2016-02-27 MED ORDER — ATORVASTATIN CALCIUM 40 MG PO TABS: 40.0000 mg | ORAL_TABLET | Freq: Every day | ORAL | Status: DC

## 2016-02-27 MED ORDER — CLOPIDOGREL BISULFATE 75 MG PO TABS: 75.0000 mg | ORAL_TABLET | Freq: Every day | ORAL | Status: DC

## 2016-02-27 NOTE — Patient Instructions (Signed)
Continue your current therapy  I will see you in 6 months.   

## 2016-02-27 NOTE — Progress Notes (Signed)
Melinda Lewis Date of Birth: [birth year] Medical Record #[id number]  History of Present Illness: Melinda Lewis is seen for follow up CAD. She is a pleasant 82 yo WF with history of DM type 2, HTN, and hypercholesterolemia who was admitted from 8/16-8/19/15 with a NSTEMI. She presented with acute respiratory failure and was hypertensive.  By Echo EF was 35-40%. She underwent cardiac cath which demonstrated severe disease in the proximal and mid LAD and mid LCx. The RCA had an 80% stenosis in the mid vessel. She had stenting of the mid LCx, proximal and mid LAD with DES stents. Repeat Echo in Dec. 2015 showed normal EF.    On follow up today she continues to do very well. She is seen with her daughter.   No  SOB. She has a very busy social schedule. Rare twinges of chest pain. She was seen in the ED in May with marked elevation of BP. On follow up her BP has been normal.     Medication List       This list is accurate as of: 02/27/16 11:58 AM.  Always use your most recent med list.               aspirin EC 81 MG tablet  Take 81 mg by mouth daily.     atorvastatin 40 MG tablet  Commonly known as:  LIPITOR  Take 1 tablet (40 mg total) by mouth daily at 6 PM.     clopidogrel 75 MG tablet  Commonly known as:  PLAVIX  Take 1 tablet (75 mg total) by mouth daily.     FLAX SEED OIL PO  Take 1 tablet by mouth daily.     furosemide 20 MG tablet  Commonly known as:  LASIX  Take 1 tablet (20 mg total) by mouth daily.     L-Arginine 1000 MG Tabs  Take 1 tablet by mouth 3 (three) times daily.     levothyroxine 150 MCG tablet  Commonly known as:  SYNTHROID, LEVOTHROID  Take 150 mcg by mouth daily before breakfast.     lisinopril 5 MG tablet  Commonly known as:  PRINIVIL,ZESTRIL  Take 1 tablet (5 mg total) by mouth daily.     magnesium oxide 400 MG tablet  Commonly known as:  MAG-OX  Take 400 mg by mouth 2 (two) times daily.     metFORMIN 500 MG tablet  Commonly known as:   GLUCOPHAGE  Take 500 mg by mouth 2 (two) times daily with a meal.     metoprolol succinate 25 MG 24 hr tablet  Commonly known as:  TOPROL-XL  Take 1 tablet (25 mg total) by mouth daily.     pantoprazole 40 MG tablet  Commonly known as:  PROTONIX  Take 40 mg by mouth daily.     ranitidine 150 MG capsule  Commonly known as:  ZANTAC  Take 1 capsule by mouth daily.         Allergies  Allergen Reactions  . Cefuroxime Axetil Nausea Only    cramps  . Clonidine Derivatives Other (See Comments)    Maker her pass out  . Epinephrine   . Penicillins   . Septra [Sulfamethoxazole-Trimethoprim] Nausea And Vomiting    Past Medical History  Diagnosis Date  . TIA (transient ischemic attack) 2010  . Diabetes mellitus without complication (HCC)   . Hypertension   . Thyroid disease   . Anemia   . NSTEMI (non-ST elevated myocardial infarction) (HCC)   .  CAD (coronary artery disease) 8/15    s/p DES LCX and LAD   . Acute on chronic systolic CHF (congestive heart failure) (HCC) 06/16/2014  . Cardiomyopathy, ischemic 06/16/2014    Past Surgical History  Procedure Laterality Date  . Left heart catheterization with coronary angiogram N/A 05/03/2014    Procedure: LEFT HEART CATHETERIZATION WITH CORONARY ANGIOGRAM;  Surgeon: Peter M Swaziland, MD;  Location: [facility] CATH LAB;  Service: Cardiovascular;  Laterality: N/A;  . Percutaneous coronary stent intervention (pci-s) Right 05/03/2014    Procedure: PERCUTANEOUS CORONARY STENT INTERVENTION (PCI-S);  Surgeon: Peter M Swaziland, MD;  Location: [facility] CATH LAB;  Service: Cardiovascular;  Laterality: Right;  . Percutaneous coronary stent intervention (pci-s) N/A 05/04/2014    Procedure: PERCUTANEOUS CORONARY STENT INTERVENTION (PCI-S);  Surgeon: Peter M Swaziland, MD;  Location: [facility] CATH LAB;  Service: Cardiovascular;  Laterality: N/A;    Social History   Social History  . Marital Status: Widowed    Spouse Name: N/A  . Number of Children: N/A  . Years of  Education: N/A   Social History Main Topics  . Smoking status: Former Smoker    Types: Cigarettes  . Smokeless tobacco: Never Used  . Alcohol Use: No  . Drug Use: No  . Sexual Activity: Not Asked   Other Topics Concern  . None   Social History Narrative    No family history on file.  Review of Systems: As noted in HPI.  All other systems were reviewed and are negative.  Physical Exam: BP 144/62 mmHg  Pulse 60  Ht 5\' 2"  (1.575 m)  Wt 159 lb 3.2 oz (72.213 kg)  BMI 29.11 kg/m2 Filed Weights   02/27/16 1058  Weight: 159 lb 3.2 oz (72.213 kg)  GENERAL:  Well appearing elderly WF in NAD HEENT:  PERRL, EOMI, sclera are clear. Oropharynx is clear. NECK:  No jugular venous distention, carotid upstroke brisk and symmetric, no bruits, no thyromegaly or adenopathy LUNGS:  Clear to auscultation bilaterally CHEST:  Unremarkable HEART:  RRR,  PMI not displaced or sustained,S1 and S2 within normal limits, no S3, no S4: no clicks, no rubs, no murmurs ABD:  Soft, nontender. BS +, no masses or bruits. No hepatomegaly, no splenomegaly EXT:  2 + pulses throughout, no edema, no cyanosis no clubbing.  SKIN:  Warm and dry.  No rashes. Minor bruising. NEURO:  Alert and oriented x 3. Cranial nerves II through XII intact. PSYCH:  Cognitively intact    LABORATORY DATA: Labs from primary care dated 01/25/16: CBC normal, BUN 32, creatinine 1.1. Glucose 135. A1c 7.2%. TSH 0.656. Lipids done 08/29/15 show cholesterol 157, trig- 236, HDL 39, LDL 71.   Assessment / Plan: 1. CAD - S/p complex stenting of the proximal and mid LAD, Mid LCx with DESs in August 2015. Moderate RCA disease that will be treated medically. Given extent of stents I would favor long term DAPT unless she has bleeding problems. Follow up in 6 months.  2. Chronic systolic CHF due to ischemic cardiomyopathy. Continue ACEi, metoprolol, lasix. Sodium restriction. Echo in Dec. 2015  showed normal LV function.  3.  Hypercholesterolemia. Well controlled on statin.  4. HTN controlled today.  5. DM type 2.

## 2016-03-05 ENCOUNTER — Telehealth: Payer: Self-pay | Admitting: Cardiology

## 2016-03-05 NOTE — Telephone Encounter (Signed)
New message     The daughter called, cause on  the pt paper work, was  the diagnoses of CHF, the daughter has never been told her Mom has ever had CHF the pt has had a heart attack and was wondering if having a heart attack and CHF went together as a diagnoses.

## 2016-03-05 NOTE — Telephone Encounter (Signed)
Returned call, and discussed w Sherian (DPR) the HF diagnosis, associated causes, and the rationale for pt's current meds. She voiced understanding of education given and was thankful for the call. Advised if any further questions she is welcome to call.

## 2016-03-08 DIAGNOSIS — E7800 Pure hypercholesterolemia, unspecified: Secondary | ICD-10-CM | POA: Diagnosis not present

## 2016-03-08 DIAGNOSIS — I10 Essential (primary) hypertension: Secondary | ICD-10-CM | POA: Diagnosis not present

## 2016-03-08 DIAGNOSIS — Z79899 Other long term (current) drug therapy: Secondary | ICD-10-CM | POA: Diagnosis not present

## 2016-03-08 DIAGNOSIS — E039 Hypothyroidism, unspecified: Secondary | ICD-10-CM | POA: Diagnosis not present

## 2016-03-08 DIAGNOSIS — E119 Type 2 diabetes mellitus without complications: Secondary | ICD-10-CM | POA: Diagnosis not present

## 2016-05-14 ENCOUNTER — Other Ambulatory Visit: Payer: Self-pay

## 2016-05-14 MED ORDER — FUROSEMIDE 20 MG PO TABS: 20.0000 mg | ORAL_TABLET | Freq: Every day | ORAL | 3 refills | Status: DC

## 2016-05-31 ENCOUNTER — Telehealth: Payer: Self-pay | Admitting: Cardiology

## 2016-05-31 DIAGNOSIS — E119 Type 2 diabetes mellitus without complications: Secondary | ICD-10-CM | POA: Diagnosis not present

## 2016-05-31 DIAGNOSIS — Z79899 Other long term (current) drug therapy: Secondary | ICD-10-CM | POA: Diagnosis not present

## 2016-05-31 DIAGNOSIS — I10 Essential (primary) hypertension: Secondary | ICD-10-CM | POA: Diagnosis not present

## 2016-05-31 DIAGNOSIS — R079 Chest pain, unspecified: Secondary | ICD-10-CM | POA: Diagnosis not present

## 2016-05-31 DIAGNOSIS — E039 Hypothyroidism, unspecified: Secondary | ICD-10-CM | POA: Diagnosis not present

## 2016-05-31 DIAGNOSIS — E7800 Pure hypercholesterolemia, unspecified: Secondary | ICD-10-CM | POA: Diagnosis not present

## 2016-05-31 NOTE — Telephone Encounter (Signed)
Spoke to [place] at [employer].She stated patient's PCP Dr.Sparks office called requesting patient to be seen soon.She has been having intermittent chest pain for the past couple of weeks.No chest pain at present.Advised Dr.Jordan out of office this week.Advised schedule appointment with a extender.Advised if patient has chest pain before appointment she needs to go to ER.

## 2016-05-31 NOTE — Telephone Encounter (Signed)
Dr Judithann Sheen from [facility] calling stating pt is at his office stating she's been having CP on and off for a few weeks  He has tried to call [place] office  But no luck getting a hold of anyone there He would like for [place] to try and get her in sooner She is in office with them now.   I am trying to get a hold of [place] office.

## 2016-06-03 NOTE — Progress Notes (Signed)
Cardiology Office Note    Date:  06/04/2016   ID:  Melinda Lewis, DOB 1918/02/25, MRN [id number]  PCP:  Marguarite Arbour, MD  Cardiologist:  Dr. Swaziland  Chief Complaint  Patient presents with  . Follow-up    seen for Dr. Swaziland, intermittent chest pain    History of Present Illness:  Melinda Lewis is a 82 y.o. female with PMH of TIA, DM, HTN, hypercholesterolemia, hypothyroidism, chronic systolic HF/ICM with improved EF and CAD. She was last admitted from 8/16-8/19/2015 with NSTEMI. She presented with acute respiratory failure and was hypertensive. Echocardiogram at that time showed EF 35-40%. She underwent cardiac cath which demonstrated severe disease in proximal and mid LAD and mid left circumflex. The RCA had 80% stenosis in mid vessel. She underwent stenting of the mid left circumflex, proximal and mid LAD with drug-eluting stents. Repeat echocardiogram in December 2015 showed normal EF. She was last seen in the office on 02/27/2016, at which time she was doing well. She did present to the emergency room in May with markedly elevated blood pressure, follow up blood pressure was normal. 6 month follow-up was recommended.  Patient was seen by PCP recently for intermittent chest discomfort, family called cardiology scheduler on 05/31/2016 requested to be seen soon. According to the patient, she had a episode of left-sided substernal chest discomfort lasted an hour or a week ago. She did not seek medical attention and did not tell anybody about it. Asked if the chest discomfort was sharp or dull, she said she was moderate. She also mentions this is different from her previous angina in 2015. She says there may have been other episodes but only lasted a few seconds so she did not pay attention to them. She denies any shortness of breath, diaphoresis or dizziness. She has no recent heart failure symptoms such as lower extremity edema, orthopnea or PND. Again she did not tell her family about  the chest discomfort, she eventually disclosed this to her PCP during last Thursdays office visit. She was referred to cardiology office for further evaluation. During the last week, she has not had any further episode of chest discomfort. She is very active for 82 year old.    Past Medical History:  Diagnosis Date  . Acute on chronic systolic CHF (congestive heart failure) (HCC) 06/16/2014  . Anemia   . CAD (coronary artery disease) 8/15   s/p DES LCX and LAD   . Cardiomyopathy, ischemic 06/16/2014  . Diabetes mellitus without complication (HCC)   . Hypertension   . NSTEMI (non-ST elevated myocardial infarction) (HCC)   . Thyroid disease   . TIA (transient ischemic attack) 2010    Past Surgical History:  Procedure Laterality Date  . LEFT HEART CATHETERIZATION WITH CORONARY ANGIOGRAM N/A 05/03/2014   Procedure: LEFT HEART CATHETERIZATION WITH CORONARY ANGIOGRAM;  Surgeon: Peter M Swaziland, MD;  Location: [facility] CATH LAB;  Service: Cardiovascular;  Laterality: N/A;  . PERCUTANEOUS CORONARY STENT INTERVENTION (PCI-S) Right 05/03/2014   Procedure: PERCUTANEOUS CORONARY STENT INTERVENTION (PCI-S);  Surgeon: Peter M Swaziland, MD;  Location: [facility] CATH LAB;  Service: Cardiovascular;  Laterality: Right;  . PERCUTANEOUS CORONARY STENT INTERVENTION (PCI-S) N/A 05/04/2014   Procedure: PERCUTANEOUS CORONARY STENT INTERVENTION (PCI-S);  Surgeon: Peter M Swaziland, MD;  Location: [facility] CATH LAB;  Service: Cardiovascular;  Laterality: N/A;    Current Medications: Outpatient Medications Prior to Visit  Medication Sig Dispense Refill  . aspirin EC 81 MG tablet Take 81 mg by mouth daily.    Marland Kitchen  atorvastatin (LIPITOR) 40 MG tablet Take 1 tablet (40 mg total) by mouth daily at 6 PM. 90 tablet 3  . clopidogrel (PLAVIX) 75 MG tablet Take 1 tablet (75 mg total) by mouth daily. 90 tablet 3  . Flaxseed, Linseed, (FLAX SEED OIL PO) Take 1 tablet by mouth daily.    . furosemide (LASIX) 20 MG tablet Take 1 tablet (20 mg total) by  mouth daily. 90 tablet 3  . L-Arginine 1000 MG TABS Take 1 tablet by mouth 3 (three) times daily.    Marland Kitchen levothyroxine (SYNTHROID, LEVOTHROID) 150 MCG tablet Take 150 mcg by mouth daily before breakfast.    . lisinopril (PRINIVIL,ZESTRIL) 5 MG tablet Take 1 tablet (5 mg total) by mouth daily. 90 tablet 3  . magnesium oxide (MAG-OX) 400 MG tablet Take 400 mg by mouth 2 (two) times daily.    . metFORMIN (GLUCOPHAGE) 500 MG tablet Take 500 mg by mouth 2 (two) times daily with a meal.    . metoprolol succinate (TOPROL-XL) 25 MG 24 hr tablet Take 1 tablet (25 mg total) by mouth daily. 90 tablet 3  . pantoprazole (PROTONIX) 40 MG tablet Take 40 mg by mouth daily.    . ranitidine (ZANTAC) 150 MG capsule Take 1 capsule by mouth daily.     No facility-administered medications prior to visit.      Allergies:   Cefuroxime axetil; Clonidine derivatives; Epinephrine; Penicillins; and Septra [sulfamethoxazole-trimethoprim]   Social History   Social History  . Marital status: Widowed    Spouse name: N/A  . Number of children: N/A  . Years of education: N/A   Social History Main Topics  . Smoking status: Former Smoker    Types: Cigarettes  . Smokeless tobacco: Never Used  . Alcohol use No  . Drug use: No  . Sexual activity: Not Asked   Other Topics Concern  . None   Social History Narrative  . None     Family History:  The patient's family history is not on file.   ROS:   Please see the history of present illness.    ROS All other systems reviewed and are negative.   PHYSICAL EXAM:   VS:  BP 124/68   Pulse 84   Ht 5\' 2"  (1.575 m)   Wt 157 lb 6.4 oz (71.4 kg)   BMI 28.79 kg/m    GEN: Well nourished, well developed, in no acute distress  HEENT: normal  Neck: no JVD, carotid bruits, or masses Cardiac: RRR; no murmurs, rubs, or gallops,no edema  Respiratory:  clear to auscultation bilaterally, normal work of breathing GI: soft, nontender, nondistended, + BS MS: no deformity or  atrophy  Skin: warm and dry, no rash Neuro:  Alert and Oriented x 3, Strength and sensation are intact Psych: euthymic mood, full affect  Wt Readings from Last 3 Encounters:  06/04/16 157 lb 6.4 oz (71.4 kg)  02/27/16 159 lb 3.2 oz (72.2 kg)  08/31/15 166 lb 11.2 oz (75.6 kg)      Studies/Labs Reviewed:   EKG:  EKG is ordered today.  The ekg ordered today demonstrates Normal sinus rhythm without significant ST-T wave changes.  Recent Labs: No results found for requested labs within last 8760 hours.   Lipid Panel    Component Value Date/Time   CHOL 157 05/02/2014 0920   TRIG 164 (H) 05/02/2014 0920   HDL 50 05/02/2014 0920   CHOLHDL 3.1 05/02/2014 0920   VLDL 33 05/02/2014 0920   LDLCALC  74 05/02/2014 0920    Additional studies/ records that were reviewed today include:   Echo 05/02/2014 LV EF: 35% -  40%  ------------------------------------------------------------------- Indications:   Chest pain 786.51.  ------------------------------------------------------------------- History:  Risk factors: NSTEMI. Pulmonary edema. Hypertension.  ------------------------------------------------------------------- Study Conclusions  - Left ventricle: The cavity size was normal. Wall thickness was normal. Systolic function was moderately reduced. The estimated ejection fraction was in the range of 35% to 40%. Severe hypokinesis of the anterolateral, inferolateral, and apical myocardium; in the distribution of the left anterior descending and left circumflex coronary artery. - Mitral valve: There was mild to moderate regurgitation directed centrally. - Left atrium: The atrium was moderately dilated. - Atrial septum: No defect or patent foramen ovale was identified. - Pulmonary arteries: Systolic pressure was mildly increased. PA peak pressure: 42 mm Hg (S).   Cath 05/03/2014 PCI Data: Vessel - LCx/Segment - mid Percent Stenosis (pre)  99% TIMI-flow  1-2 Stent 3.0 x 20 mm Promus Percent Stenosis (post) 0% TIMI-flow (post) 3  Final Conclusions:   1. Severe 3 vessel obstructive CAD 2. Successful stenting of culprit lesion in the mid LCx with a DES.   Recommendations:  Continue DAPT for at least a year. Will discuss with patient and family options of medical therapy versus PCI for other disease in LAD.   Cath 05/04/2014 Lesion Data: Vessel: LAD- proximal to mid Percent stenosis (pre): 90% TIMI-flow (pre):  3 Stent:  2.5 x 38 mm Promus Percent stenosis (post): 0% TIMI-flow (post): 3  Conclusions: Successful stenting of the proximal to mid LAD with a DES.  Recommendations: Continue DAPT for at least one year. Anticipate DC tomorrow if no complications.     Echo 08/24/2014 LV EF: 65% -  70%  ------------------------------------------------------------------- Indications:   414.01 Coronary atherosclerosis - native artery.  ------------------------------------------------------------------- History:  PMH: NSTEMI, Cardiomyopathy Ischemic. Congestive heart failure. Risk factors: Hypertension.  ------------------------------------------------------------------- Study Conclusions  - Left ventricle: There was mild concentric hypertrophy. Systolic function was vigorous. The estimated ejection fraction was in the range of 65% to 70%. Wall motion was normal; there were no regional wall motion abnormalities. Doppler parameters are consistent with abnormal left ventricular relaxation (grade 1 diastolic dysfunction). Doppler parameters are consistent with elevated ventricular end-diastolic filling pressure. - Aortic valve: Mildly thickened leaflets. There was no stenosis. - Mitral valve: Mildly thickened leaflets . There was trivial regurgitation. - Left atrium: The atrium was normal in size. - Right ventricle: Systolic function was normal. Systolic pressure was within the normal range. - Tricuspid  valve: There was mild regurgitation. - Pulmonic valve: There was no regurgitation. - Pulmonary arteries: Systolic pressure was mildly increased. PA peak pressure: 41 mm Hg (S). - Inferior vena cava: The vessel was normal in size. The respirophasic diameter changes were in the normal range (= 50%), consistent with normal central venous pressure.  Impressions:  - Normal biventricular size and systolic function. Abnormal LV relaxation with mildly elevated filling pressures. No regional wall motion abnormalities were seen. Mild tricuspid regurgitation with mild pulmonary hypertension.   ASSESSMENT:    1. Coronary artery disease involving native coronary artery of native heart with other form of angina pectoris (HCC)   2. Essential hypertension   3. Hyperlipidemia   4. Hypothyroidism, unspecified hypothyroidism type   5. Diabetes mellitus type 2 in nonobese [facility])      PLAN:  In order of problems listed above:  1. CAD with intermittent chest pain - She does have intermittent chest pain, the only  episode that lasted longer than several second was 1 hour of chest pain a week ago. Her EKG was reviewed today, no obvious changes when compared to the previous EKG. She says last week's episode of chest pain is different from her usual angina location was in the characteristic rise. She is fairly active and has not had any obvious exertional symptom. I will obtain outpatient echocardiogram, however given her advanced age, if EF is normal, would not recommend pursuing any further workup. She is still taking her aspirin and Plavix, however does not seems to have any bleeding issues on DAPT  2. HTN:  Blood pressure well-controlled on lisinopril and metoprolol.  3. HLD: Continue Lipitor. She says she had recent labs at her previous PCP's office last Thursday, we'll request last office note and the recent lab work.  4. Hypothyroidism: Continue on levothyroxin.  5. DM: Diabetes  management by PCP.  6. chronic systolic HF/ICM with improved EF: In 2015, her EF was moderately decompressed, however repeat echocardiogram in December 2015 showed normal EF.    Medication Adjustments/Labs and Tests Ordered: Current medicines are reviewed at length with the patient today.  Concerns regarding medicines are outlined above.  Medication changes, Labs and Tests ordered today are listed in the Patient Instructions below. Patient Instructions  Medication Instructions:  NO CHANGES.   Testing/Procedures: Your physician has requested that you have an echocardiogram. Echocardiography is a painless test that uses sound waves to create images of your heart. It provides your doctor with information about the size and shape of your heart and how well your heart's chambers and valves are working. This procedure takes approximately one hour. There are no restrictions for this procedure.   Follow-Up: Your physician recommends that you schedule a follow-up appointment in December WITH DR Swaziland AS SCHEDULED.   Any Other Special Instructions Will Be Listed Below (If Applicable). Echocardiogram An echocardiogram, or echocardiography, uses sound waves (ultrasound) to produce an image of your heart. The echocardiogram is simple, painless, obtained within a short period of time, and offers valuable information to your health care provider. The images from an echocardiogram can provide information such as:  Evidence of coronary artery disease (CAD).  Heart size.  Heart muscle function.  Heart valve function.  Aneurysm detection.  Evidence of a past heart attack.  Fluid buildup around the heart.  Heart muscle thickening.  Assess heart valve function. LET [facility] CARE PROVIDER KNOW ABOUT:  Any allergies you have.  All medicines you are taking, including vitamins, herbs, eye drops, creams, and over-the-counter medicines.  Previous problems you or members of your family have  had with the use of anesthetics.  Any blood disorders you have.  Previous surgeries you have had.  Medical conditions you have.  Possibility of pregnancy, if this applies. BEFORE THE PROCEDURE  No special preparation is needed. Eat and drink normally.  PROCEDURE   In order to produce an image of your heart, gel will be applied to your chest and a wand-like tool (transducer) will be moved over your chest. The gel will help transmit the sound waves from the transducer. The sound waves will harmlessly bounce off your heart to allow the heart images to be captured in real-time motion. These images will then be recorded.  You may need an IV to receive a medicine that improves the quality of the pictures. AFTER THE PROCEDURE You may return to your normal schedule including diet, activities, and medicines, unless your health care provider tells you  otherwise.   This information is not intended to replace advice given to you by your health care provider. Make sure you discuss any questions you have with your health care provider.   Document Released: 08/31/2000 Document Revised: 09/24/2014 Document Reviewed: 05/11/2013 Elsevier Interactive Patient Education [employer]2016 Elsevier Inc.     If you need a refill on your cardiac medications before your next appointment, please call your pharmacy.      Ramond DialSigned, Anjalina Bergevin, [place]  06/04/2016 12:29 PM    [facility] [address], [place], [place]  [address] Phone: [phone]; Fax: [phone]

## 2016-06-04 ENCOUNTER — Ambulatory Visit (INDEPENDENT_AMBULATORY_CARE_PROVIDER_SITE_OTHER): Payer: PPO | Admitting: Physician Assistant

## 2016-06-04 ENCOUNTER — Encounter: Payer: Self-pay | Admitting: Physician Assistant

## 2016-06-04 VITALS — BP 124/68 | HR 84 | Ht 62.0 in | Wt 157.4 lb

## 2016-06-04 DIAGNOSIS — I10 Essential (primary) hypertension: Secondary | ICD-10-CM

## 2016-06-04 DIAGNOSIS — E039 Hypothyroidism, unspecified: Secondary | ICD-10-CM

## 2016-06-04 DIAGNOSIS — E119 Type 2 diabetes mellitus without complications: Secondary | ICD-10-CM

## 2016-06-04 DIAGNOSIS — E785 Hyperlipidemia, unspecified: Secondary | ICD-10-CM | POA: Diagnosis not present

## 2016-06-04 DIAGNOSIS — I25118 Atherosclerotic heart disease of native coronary artery with other forms of angina pectoris: Secondary | ICD-10-CM | POA: Diagnosis not present

## 2016-06-04 NOTE — Patient Instructions (Signed)
Medication Instructions:  NO CHANGES.   Testing/Procedures: Your physician has requested that you have an echocardiogram. Echocardiography is a painless test that uses sound waves to create images of your heart. It provides your doctor with information about the size and shape of your heart and how well your heart's chambers and valves are working. This procedure takes approximately one hour. There are no restrictions for this procedure.   Follow-Up: Your physician recommends that you schedule a follow-up appointment in December WITH DR Swaziland AS SCHEDULED.   Any Other Special Instructions Will Be Listed Below (If Applicable). Echocardiogram An echocardiogram, or echocardiography, uses sound waves (ultrasound) to produce an image of your heart. The echocardiogram is simple, painless, obtained within a short period of time, and offers valuable information to your health care provider. The images from an echocardiogram can provide information such as:  Evidence of coronary artery disease (CAD).  Heart size.  Heart muscle function.  Heart valve function.  Aneurysm detection.  Evidence of a past heart attack.  Fluid buildup around the heart.  Heart muscle thickening.  Assess heart valve function. LET [facility] CARE PROVIDER KNOW ABOUT:  Any allergies you have.  All medicines you are taking, including vitamins, herbs, eye drops, creams, and over-the-counter medicines.  Previous problems you or members of your family have had with the use of anesthetics.  Any blood disorders you have.  Previous surgeries you have had.  Medical conditions you have.  Possibility of pregnancy, if this applies. BEFORE THE PROCEDURE  No special preparation is needed. Eat and drink normally.  PROCEDURE   In order to produce an image of your heart, gel will be applied to your chest and a wand-like tool (transducer) will be moved over your chest. The gel will help transmit the sound waves from  the transducer. The sound waves will harmlessly bounce off your heart to allow the heart images to be captured in real-time motion. These images will then be recorded.  You may need an IV to receive a medicine that improves the quality of the pictures. AFTER THE PROCEDURE You may return to your normal schedule including diet, activities, and medicines, unless your health care provider tells you otherwise.   This information is not intended to replace advice given to you by your health care provider. Make sure you discuss any questions you have with your health care provider.   Document Released: 08/31/2000 Document Revised: 09/24/2014 Document Reviewed: 05/11/2013 Elsevier Interactive Patient Education [employer]     If you need a refill on your cardiac medications before your next appointment, please call your pharmacy.

## 2016-06-07 ENCOUNTER — Telehealth: Payer: Self-pay | Admitting: Cardiology

## 2016-06-07 NOTE — Telephone Encounter (Signed)
Faxed Release signed by patient to Dr Aram Beecham to obtain records per Azalee Course, PA.  Faxed on 06/05/16. lp

## 2016-06-14 ENCOUNTER — Ambulatory Visit (HOSPITAL_COMMUNITY): Payer: PPO | Attending: Cardiology

## 2016-06-14 ENCOUNTER — Other Ambulatory Visit: Payer: Self-pay

## 2016-06-14 DIAGNOSIS — I501 Left ventricular failure: Secondary | ICD-10-CM | POA: Insufficient documentation

## 2016-06-14 DIAGNOSIS — I10 Essential (primary) hypertension: Secondary | ICD-10-CM | POA: Diagnosis not present

## 2016-06-14 DIAGNOSIS — E039 Hypothyroidism, unspecified: Secondary | ICD-10-CM | POA: Diagnosis not present

## 2016-06-14 DIAGNOSIS — E785 Hyperlipidemia, unspecified: Secondary | ICD-10-CM | POA: Insufficient documentation

## 2016-06-14 DIAGNOSIS — I25118 Atherosclerotic heart disease of native coronary artery with other forms of angina pectoris: Secondary | ICD-10-CM | POA: Diagnosis not present

## 2016-06-14 DIAGNOSIS — I361 Nonrheumatic tricuspid (valve) insufficiency: Secondary | ICD-10-CM | POA: Insufficient documentation

## 2016-06-14 DIAGNOSIS — I517 Cardiomegaly: Secondary | ICD-10-CM | POA: Insufficient documentation

## 2016-06-28 ENCOUNTER — Telehealth: Payer: Self-pay | Admitting: Cardiology

## 2016-06-28 ENCOUNTER — Encounter: Payer: Self-pay | Admitting: Cardiology

## 2016-06-28 NOTE — Telephone Encounter (Signed)
Received records from [facility] - Dr Aram Beecham as requested by Dr Swaziland.  Records given to Dr Swaziland for review. lp

## 2016-07-16 DIAGNOSIS — G8929 Other chronic pain: Secondary | ICD-10-CM | POA: Diagnosis not present

## 2016-07-16 DIAGNOSIS — M7552 Bursitis of left shoulder: Secondary | ICD-10-CM | POA: Diagnosis not present

## 2016-07-16 DIAGNOSIS — M75121 Complete rotator cuff tear or rupture of right shoulder, not specified as traumatic: Secondary | ICD-10-CM | POA: Diagnosis not present

## 2016-07-16 DIAGNOSIS — M75102 Unspecified rotator cuff tear or rupture of left shoulder, not specified as traumatic: Secondary | ICD-10-CM | POA: Diagnosis not present

## 2016-07-16 DIAGNOSIS — M19011 Primary osteoarthritis, right shoulder: Secondary | ICD-10-CM | POA: Diagnosis not present

## 2016-07-16 DIAGNOSIS — M25562 Pain in left knee: Secondary | ICD-10-CM | POA: Diagnosis not present

## 2016-09-05 DIAGNOSIS — R8290 Unspecified abnormal findings in urine: Secondary | ICD-10-CM | POA: Diagnosis not present

## 2016-09-05 DIAGNOSIS — E039 Hypothyroidism, unspecified: Secondary | ICD-10-CM | POA: Diagnosis not present

## 2016-09-05 DIAGNOSIS — E7800 Pure hypercholesterolemia, unspecified: Secondary | ICD-10-CM | POA: Diagnosis not present

## 2016-09-05 DIAGNOSIS — E119 Type 2 diabetes mellitus without complications: Secondary | ICD-10-CM | POA: Diagnosis not present

## 2016-09-05 DIAGNOSIS — Z79899 Other long term (current) drug therapy: Secondary | ICD-10-CM | POA: Diagnosis not present

## 2016-09-05 DIAGNOSIS — I10 Essential (primary) hypertension: Secondary | ICD-10-CM | POA: Diagnosis not present

## 2016-09-09 NOTE — Progress Notes (Signed)
Melinda Lewis Date of Birth: [birth year] Medical Record #[id number]  History of Present Illness: Melinda Lewis is seen for follow up CAD. She is a pleasant 82 yo WF with history of DM type 2, HTN, and hypercholesterolemia who was admitted from 8/16-8/19/15 with a NSTEMI. She presented with acute respiratory failure and was hypertensive.  By Echo EF was 35-40%. She underwent cardiac cath which demonstrated severe disease in the proximal and mid LAD and mid LCx. The RCA had an 80% stenosis in the mid vessel. She had stenting of the mid LCx, proximal and mid LAD with DES stents. Repeat Echo in Dec. 2015 showed normal EF. She was seen in September with atypical chest pain. Echo was unchanged.   On follow up today she continues to do very well. She is seen with her daughter.   No  SOB. No Chest pain. She is active and walks with a walker.   Allergies as of 09/13/2016      Reactions   Cefuroxime Axetil Nausea Only   cramps   Clonidine Derivatives Other (See Comments)   Maker her pass out   Epinephrine    Penicillins    Septra [sulfamethoxazole-trimethoprim] Nausea And Vomiting      Medication List       Accurate as of 09/13/16  1:56 PM. Always use your most recent med list.          aspirin EC 81 MG tablet Take 81 mg by mouth daily.   atorvastatin 40 MG tablet Commonly known as:  LIPITOR Take 1 tablet (40 mg total) by mouth daily at 6 PM.   clopidogrel 75 MG tablet Commonly known as:  PLAVIX Take 1 tablet (75 mg total) by mouth daily.   FLAX SEED OIL PO Take 1 tablet by mouth daily.   furosemide 20 MG tablet Commonly known as:  LASIX Take 1 tablet (20 mg total) by mouth daily.   L-Arginine 1000 MG Tabs Take 1 tablet by mouth 3 (three) times daily.   levothyroxine 150 MCG tablet Commonly known as:  SYNTHROID, LEVOTHROID Take 150 mcg by mouth daily before breakfast.   lisinopril 5 MG tablet Commonly known as:  PRINIVIL,ZESTRIL Take 1 tablet (5 mg total) by mouth  daily.   magnesium oxide 400 MG tablet Commonly known as:  MAG-OX Take 400 mg by mouth 2 (two) times daily.   metFORMIN 500 MG tablet Commonly known as:  GLUCOPHAGE Take 500 mg by mouth 2 (two) times daily with a meal.   metoprolol succinate 25 MG 24 hr tablet Commonly known as:  TOPROL-XL Take 1 tablet (25 mg total) by mouth daily.   pantoprazole 40 MG tablet Commonly known as:  PROTONIX Take 40 mg by mouth daily.   ranitidine 150 MG tablet Commonly known as:  ZANTAC Take 150 mg by mouth daily.        Allergies  Allergen Reactions  . Cefuroxime Axetil Nausea Only    cramps  . Clonidine Derivatives Other (See Comments)    Maker her pass out  . Epinephrine   . Penicillins   . Septra [Sulfamethoxazole-Trimethoprim] Nausea And Vomiting    Past Medical History:  Diagnosis Date  . Acute on chronic systolic CHF (congestive heart failure) (HCC) 06/16/2014  . Anemia   . CAD (coronary artery disease) 8/15   s/p DES LCX and LAD   . Cardiomyopathy, ischemic 06/16/2014  . Diabetes mellitus without complication (HCC)   . Hypertension   . NSTEMI (non-ST elevated myocardial infarction) (  HCC)   . Thyroid disease   . TIA (transient ischemic attack) 2010    Past Surgical History:  Procedure Laterality Date  . LEFT HEART CATHETERIZATION WITH CORONARY ANGIOGRAM N/A 05/03/2014   Procedure: LEFT HEART CATHETERIZATION WITH CORONARY ANGIOGRAM;  Surgeon: Ripken Rekowski M [place], MD;  Location: [facility] CATH LAB;  Service: Cardiovascular;  Laterality: N/A;  . PERCUTANEOUS CORONARY STENT INTERVENTION (PCI-S) Right 05/03/2014   Procedure: PERCUTANEOUS CORONARY STENT INTERVENTION (PCI-S);  Surgeon: Michiel Sivley M [place], MD;  Location: [facility] CATH LAB;  Service: Cardiovascular;  Laterality: Right;  . PERCUTANEOUS CORONARY STENT INTERVENTION (PCI-S) N/A 05/04/2014   Procedure: PERCUTANEOUS CORONARY STENT INTERVENTION (PCI-S);  Surgeon:  Antolin M [place], MD;  Location: [facility] CATH LAB;  Service: Cardiovascular;  Laterality:  N/A;    Social History   Social History  . Marital status: Widowed    Spouse name: N/A  . Number of children: N/A  . Years of education: N/A   Social History Main Topics  . Smoking status: Former Smoker    Types: Cigarettes  . Smokeless tobacco: Never Used  . Alcohol use No  . Drug use: No  . Sexual activity: Not Asked   Other Topics Concern  . None   Social History Narrative  . None    History reviewed. No pertinent family history.  Review of Systems: As noted in HPI.  All other systems were reviewed and are negative.  Physical Exam: BP (!) 148/62 (BP Location: Left Arm)   Pulse 86   Ht  (1.575 m)   Wt 154 lb 12.8 oz (70.2 kg)   BMI 28.31 kg/m  Filed Weights   09/13/16 1345  Weight: 154 lb 12.8 oz (70.2 kg)  GENERAL:  Well appearing elderly WF in NAD HEENT:  PERRL, EOMI, sclera are clear. Oropharynx is clear. NECK:  No jugular venous distention, carotid upstroke brisk and symmetric, no bruits, no thyromegaly or adenopathy LUNGS:  Clear to auscultation bilaterally CHEST:  Unremarkable HEART:  RRR,  PMI not displaced or sustained,S1 and S2 within normal limits, no S3, no S4: no clicks, no rubs, no murmurs ABD:  Soft, nontender. BS +, no masses or bruits. No hepatomegaly, no splenomegaly EXT:  2 + pulses throughout, no edema, no cyanosis no clubbing.  SKIN:  Warm and dry.  No rashes. Minor bruising. NEURO:  Alert and oriented x 3. Cranial nerves II through XII intact. PSYCH:  Cognitively intact    LABORATORY DATA: Labs from primary care dated 09/05/16: CBC with Hgb 12.3 WBC 10.7, BUN 24, creatinine 1.1. Glucose 118. Other chemistries normal. A1c 6.6%. TSH 9.7. Cholesterol 148, trig- 150, HDL 41, LDL 77.   Echo 06/14/16: Study Conclusions  - Left ventricle: The cavity size was normal. Wall thickness was   increased in a pattern of mild LVH. Systolic function was normal.   The estimated ejection fraction was in the range of 60% to 65%.   Wall motion was  normal; there were no regional wall motion   abnormalities. Doppler parameters are consistent with abnormal   left ventricular relaxation (grade 1 diastolic dysfunction). - Mitral valve: Calcified annulus. - Tricuspid valve: There was mild regurgitation. - Pulmonary arteries: Systolic pressure was mildly increased. PA   peak pressure: 39 mm Hg (S).  Impressions:  - Compared to the prior study, there has been no significant   interval change.  Assessment / Plan: 1. CAD - S/p complex stenting of the proximal and mid LAD, Mid LCx with DESs in August 2015. Moderate  RCA disease that will be treated medically. Given extent of stents I would favor long term DAPT unless she has bleeding problems. she is asymptomatic and doing very well. Follow up in 6 months.  2. Chronic systolic CHF due to ischemic cardiomyopathy. Continue ACEi, metoprolol, lasix. Sodium restriction. Echo in September 2017 showed normal LV function.  3. Hypercholesterolemia. Well controlled on statin.  4. HTN controlled today.  5. DM type 2.

## 2016-09-13 ENCOUNTER — Encounter: Payer: Self-pay | Admitting: Cardiology

## 2016-09-13 ENCOUNTER — Ambulatory Visit (INDEPENDENT_AMBULATORY_CARE_PROVIDER_SITE_OTHER): Payer: PPO | Admitting: Cardiology

## 2016-09-13 VITALS — BP 148/62 | HR 86 | Ht 62.0 in | Wt 154.8 lb

## 2016-09-13 DIAGNOSIS — I5022 Chronic systolic (congestive) heart failure: Secondary | ICD-10-CM

## 2016-09-13 DIAGNOSIS — I10 Essential (primary) hypertension: Secondary | ICD-10-CM

## 2016-09-13 DIAGNOSIS — I25118 Atherosclerotic heart disease of native coronary artery with other forms of angina pectoris: Secondary | ICD-10-CM

## 2016-09-13 NOTE — Patient Instructions (Signed)
Continue your current therapy  I will see you in 6 months.   

## 2016-10-08 ENCOUNTER — Other Ambulatory Visit: Payer: Self-pay | Admitting: Cardiology

## 2017-01-23 ENCOUNTER — Encounter: Payer: Self-pay | Admitting: Emergency Medicine

## 2017-01-23 ENCOUNTER — Emergency Department: Payer: Medicare Other

## 2017-01-23 ENCOUNTER — Emergency Department
Admission: EM | Admit: 2017-01-23 | Discharge: 2017-01-23 | Disposition: A | Discharge status: Home / Self Care | Payer: Medicare Other | Attending: Emergency Medicine | Admitting: Emergency Medicine

## 2017-01-23 DIAGNOSIS — Z7984 Long term (current) use of oral hypoglycemic drugs: Secondary | ICD-10-CM | POA: Insufficient documentation

## 2017-01-23 DIAGNOSIS — I2510 Atherosclerotic heart disease of native coronary artery without angina pectoris: Secondary | ICD-10-CM | POA: Diagnosis not present

## 2017-01-23 DIAGNOSIS — Z87891 Personal history of nicotine dependence: Secondary | ICD-10-CM | POA: Diagnosis not present

## 2017-01-23 DIAGNOSIS — I110 Hypertensive heart disease with heart failure: Secondary | ICD-10-CM | POA: Insufficient documentation

## 2017-01-23 DIAGNOSIS — Z79899 Other long term (current) drug therapy: Secondary | ICD-10-CM | POA: Insufficient documentation

## 2017-01-23 DIAGNOSIS — I10 Essential (primary) hypertension: Secondary | ICD-10-CM | POA: Diagnosis present

## 2017-01-23 DIAGNOSIS — I5023 Acute on chronic systolic (congestive) heart failure: Secondary | ICD-10-CM | POA: Diagnosis not present

## 2017-01-23 DIAGNOSIS — N3000 Acute cystitis without hematuria: Secondary | ICD-10-CM | POA: Diagnosis not present

## 2017-01-23 DIAGNOSIS — E119 Type 2 diabetes mellitus without complications: Secondary | ICD-10-CM | POA: Diagnosis not present

## 2017-01-23 LAB — BASIC METABOLIC PANEL
ANION GAP: 7 (ref 5–15)
BUN: 33 mg/dL — AB (ref 6–20)
CHLORIDE: 99 mmol/L — AB (ref 101–111)
CO2: 28 mmol/L (ref 22–32)
CREATININE: 1.08 mg/dL — AB (ref 0.44–1.00)
Calcium: 9.4 mg/dL (ref 8.9–10.3)
GFR calc non Af Amer: 42 mL/min — ABNORMAL LOW (ref >60)
GFR, EST AFRICAN AMERICAN: 49 mL/min — AB (ref >60)
GLUCOSE: 193 mg/dL — AB (ref 65–99)
Potassium: 4.4 mmol/L (ref 3.5–5.1)
SODIUM: 134 mmol/L — AB (ref 135–145)

## 2017-01-23 LAB — URINALYSIS, COMPLETE (UACMP) WITH MICROSCOPIC
BILIRUBIN URINE: NEGATIVE
Glucose, UA: NEGATIVE mg/dL
KETONES UR: NEGATIVE mg/dL
NITRITE: NEGATIVE
PROTEIN: NEGATIVE mg/dL
SPECIFIC GRAVITY, URINE: 1.014 (ref 1.005–1.030)
pH: 5 (ref 5.0–8.0)

## 2017-01-23 LAB — CBC
HCT: 37.2 % (ref 35.0–47.0)
Hemoglobin: 12.1 g/dL (ref 12.0–16.0)
MCH: 28.2 pg (ref 26.0–34.0)
MCHC: 32.5 g/dL (ref 32.0–36.0)
MCV: 86.8 fL (ref 80.0–100.0)
PLATELETS: 403 10*3/uL (ref 150–440)
RBC: 4.28 MIL/uL (ref 3.80–5.20)
RDW: 15.2 % — AB (ref 11.5–14.5)
WBC: 18 10*3/uL — AB (ref 3.6–11.0)

## 2017-01-23 LAB — TROPONIN I: Troponin I: <0.03 ng/mL (ref <0.03)

## 2017-01-23 MED ORDER — CIPROFLOXACIN HCL 500 MG PO TABS: 500.0000 mg | ORAL_TABLET | Freq: Two times a day (BID) | ORAL | 0 refills | Status: AC

## 2017-01-23 MED ORDER — LISINOPRIL 10 MG PO TABS
10.0000 mg | ORAL_TABLET | Freq: Once | ORAL | Status: AC
  Administered 2017-01-23: 10 mg via ORAL
  Filled 2017-01-23: qty 1

## 2017-01-23 MED ORDER — METOPROLOL TARTRATE 25 MG PO TABS
25.0000 mg | ORAL_TABLET | Freq: Once | ORAL | Status: AC
  Administered 2017-01-23: 25 mg via ORAL
  Filled 2017-01-23: qty 1

## 2017-01-23 MED ORDER — CIPROFLOXACIN HCL 500 MG PO TABS
500.0000 mg | ORAL_TABLET | Freq: Once | ORAL | Status: AC
  Administered 2017-01-23: 500 mg via ORAL
  Filled 2017-01-23: qty 1

## 2017-01-23 NOTE — ED Notes (Signed)
Pt in NAD at this time and sitting up in bedside chair alert and oriented. Pt denies pain at this time.

## 2017-01-23 NOTE — ED Notes (Signed)
Pt taken to car via wheelchair by family. Pt in NAD at time of d/c.

## 2017-01-23 NOTE — Discharge Instructions (Signed)
Please drink plenty of fluid to stay well-hydrated and to help flush her kidneys. Please take the entire course of antibiotics, even if you're feeling better.  Tomorrow morning, please call Dr. Judithann Sheen office to make an appointment to be reevaluated for your high blood pressure. Return to the emergency department if you develop severe pain, headache, blurred vision, numbness tingling or weakness, chest pain, shortness of breath, palpitations, fever, lightheadedness or fainting, or any other symptoms concerning to you.

## 2017-01-23 NOTE — ED Triage Notes (Signed)
Pt presents to ED with c/o high blood pressure. Daughter reports took pt to EMS and reports bp was 224/108. Daughter states PCP changed lisinopril to 10 mg/ daily. Pt denies headache, chest pain, or vision problems. Pt denies pain or any complaints. Pt alert and oriented x 4, respirations even and unlabored.

## 2017-01-23 NOTE — ED Provider Notes (Signed)
[facility] Emergency Department Provider Note  ____________________________________________  Time seen: Approximately 9:05 PM  I have reviewed the triage vital signs and the nursing notes.   HISTORY  Chief Complaint Hypertension    HPI Melinda Lewis is a 83 y.o. female with a history of hypertension on lisinopril, Lasix, and metoprolol presenting for hypertension and generalized weakness. The patient reports that for the past week, "I just haven't felt well." On Monday, EMS went to the patient's home for the symptoms and she had a blood pressure of 230s over 70s. She was seen that afternoon by Dr. Judithann Sheen PA, and her lisinopril was increased from 5 mg to 10 mg. Since then, she has continued to feel poorly but denies any chest pain, palpitations, shortness of breath, lightheadedness or syncope, visual changes, speech changes, confusion, difficulty walking, numbness tingling or weakness. She does note some urinary frequency w/o dysuria.   Past Medical History:  Diagnosis Date  . Acute on chronic systolic CHF (congestive heart failure) (HCC) 06/16/2014  . Anemia   . CAD (coronary artery disease) 8/15   s/p DES LCX and LAD   . Cardiomyopathy, ischemic 06/16/2014  . Diabetes mellitus without complication (HCC)   . Hypertension   . NSTEMI (non-ST elevated myocardial infarction) (HCC)   . Thyroid disease   . TIA (transient ischemic attack) 2010    Patient Active Problem List   Diagnosis Date Noted  . Chronic systolic CHF (congestive heart failure) (HCC) 03/04/2015  . Acute on chronic systolic CHF (congestive heart failure) (HCC) 06/16/2014  . Cardiomyopathy, ischemic 06/16/2014  . CAD, multiple vessel 06/15/2014  . Anemia 05/05/2014  . Hypothyroidism 05/05/2014  . 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/05/2014  . NSTEMI (non-ST elevated myocardial infarction) (HCC) 05/02/2014  . Hypertension 05/02/2014  . Pulmonary edema cardiac cause (HCC) 05/02/2014     Past Surgical History:  Procedure Laterality Date  . LEFT HEART CATHETERIZATION WITH CORONARY ANGIOGRAM N/A 05/03/2014   Procedure: LEFT HEART CATHETERIZATION WITH CORONARY ANGIOGRAM;  Surgeon: Peter M [place], MD;  Location: [facility] CATH LAB;  Service: Cardiovascular;  Laterality: N/A;  . PERCUTANEOUS CORONARY STENT INTERVENTION (PCI-S) Right 05/03/2014   Procedure: PERCUTANEOUS CORONARY STENT INTERVENTION (PCI-S);  Surgeon: Peter M [place], MD;  Location: [facility] CATH LAB;  Service: Cardiovascular;  Laterality: Right;  . PERCUTANEOUS CORONARY STENT INTERVENTION (PCI-S) N/A 05/04/2014   Procedure: PERCUTANEOUS CORONARY STENT INTERVENTION (PCI-S);  Surgeon: Peter M [place], MD;  Location: [facility] CATH LAB;  Service: Cardiovascular;  Laterality: N/A;    Current Outpatient Rx  . Order #: [id number] Class: Historical Med  . Order #: [id number] Class: Normal  . Order #: [id number] Class: Normal  . Order #: [id number] Class: Historical Med  . Order #: [id number] Class: Normal  . Order #: [id number] Class: Historical Med  . Order #: [id number] Class: Historical Med  . Order #: [id number] Class: Normal  . Order #: [id number] Class: Historical Med  . Order #: [id number] Class: Historical Med  . Order #: [id number] Class: Normal  . Order #: [id number] Class: Historical Med  . Order #: [id number] Class: Historical Med    Allergies Cefuroxime axetil; Clonidine derivatives; Epinephrine; Penicillins; and Septra [sulfamethoxazole-trimethoprim]  No family history on file.  Social History Social History  Substance Use Topics  . Smoking status: Former Smoker    Types: Cigarettes  . Smokeless tobacco: Never Used  . Alcohol use No    Review of Systems Constitutional: No fever/chills.No lightheadedness or syncope. Positive generalized malaise. Eyes: No visual changes. ENT: No sore throat. No congestion or rhinorrhea. Cardiovascular:  Denies chest pain. Denies palpitations. Positive hypertension. Respiratory: Denies shortness of  breath.  No cough. Gastrointestinal: No abdominal pain.  No nausea, no vomiting.  No diarrhea.  No constipation. Genitourinary: Negative for dysuria. Musculoskeletal: Negative for back pain. No lower extremity swelling or calf pain. Skin: Negative for rash. Neurological: Negative for headaches. No focal numbness, tingling or weakness. No visual or speech changes. No confusion. No difficulty walking.  10-point ROS otherwise negative.  ____________________________________________   PHYSICAL EXAM:  VITAL SIGNS: ED Triage Vitals  Enc Vitals Group     BP 01/23/17 1928 (!) 203/60     Pulse Rate 01/23/17 1928 85     Resp 01/23/17 1928 18     Temp 01/23/17 1928 97.9 F (36.6 C)     Temp Source 01/23/17 1928 Oral     SpO2 01/23/17 1928 98 %     Weight 01/23/17 1929 154 lb (69.9 kg)     Height 01/23/17 1929 5\' 2"  (1.575 m)     Head Circumference --      Peak Flow --      Pain Score --      Pain Loc --      Pain Edu? --      Excl. in GC? --     Constitutional: Alert and oriented. Well appearing and in no acute distress. Answers questions appropriately. Eyes: Conjunctivae are normal.  EOMI. No scleral icterus. Head: Atraumatic. Nose: No congestion/rhinnorhea. Mouth/Throat: Mucous membranes are moist.  Neck: No stridor.  Supple.  No JVD. No meningismus. Cardiovascular: Normal rate, regular rhythm. No murmurs, rubs or gallops.  Respiratory: Normal respiratory effort.  No accessory muscle use or retractions. Lungs CTAB.  No wheezes, rales or ronchi. Gastrointestinal: Soft, nontender and nondistended.  No guarding or rebound.  No peritoneal signs. Musculoskeletal: No LE edema. No ttp in the calves or palpable cords.  Negative Homan's sign. Neurologic:  A&Ox3.  Speech is clear.  Face and smile are symmetric.  EOMI.  Moves all extremities well. Normal gait without ataxia. Skin:  Skin is warm, dry and intact. No rash noted. Psychiatric: Mood and affect are normal. Speech and behavior are  normal.  Normal judgement.  ____________________________________________   LABS (all labs ordered are listed, but only abnormal results are displayed)  Labs Reviewed  BASIC METABOLIC PANEL - Abnormal; Notable for the following:       Result Value   Sodium 134 (*)    Chloride 99 (*)    Glucose, Bld 193 (*)    BUN 33 (*)    Creatinine, Ser 1.08 (*)    GFR calc non Af Amer 42 (*)    GFR calc Af Amer 49 (*)    All other components within normal limits  CBC - Abnormal; Notable for the following:    WBC 18.0 (*)    RDW 15.2 (*)    All other components within normal limits  TROPONIN I  URINALYSIS, COMPLETE (UACMP) WITH MICROSCOPIC   ____________________________________________  EKG  ED ECG REPORT I, Rockne Menghini, the attending physician, personally viewed and interpreted this ECG.   Date: 01/23/2017  EKG Time: 1934  Rate: 79  Rhythm: normal sinus rhythm  Axis: normal  Intervals:none  ST&T Change: No STEMI  ____________________________________________  RADIOLOGY  Dg Chest 2 View  Result Date: 01/23/2017 CLINICAL DATA:  Chest pain.  Hypertension. EXAM: CHEST  2 VIEW COMPARISON:  05/02/2014 FINDINGS: The heart is normal in size. There is atherosclerosis and tortuosity of the thoracic aorta  coronary artery stent is seen. Resolved pulmonary edema and vascular congestion from prior exam. No consolidation, large pleural effusion or pneumothorax. Calcified granuloma in the left lung with calcified left hilar lymph nodes, sequela of prior granulomatous disease. Chronic deformity about both shoulders. IMPRESSION: 1. No acute abnormality. 2. Thoracic aortic tortuosity and atherosclerosis. Electronically Signed   By: Rubye Oaks M.D.   On: 01/23/2017 20:06    ____________________________________________   PROCEDURES  Procedure(s) performed: None  Procedures  Critical Care performed: No ____________________________________________   INITIAL IMPRESSION /  ASSESSMENT AND PLAN / ED COURSE  Pertinent labs & imaging results that were available during my care of the patient were reviewed by me and considered in my medical decision making (see chart for details).  83 y.o. email with a history of hypertension on 3 antihypertensives presenting with generalized weakness but no other focal symptoms. The patient's blood pressure here is 203/60, which is improved compared to her blood pressure prior to doubling her lisinopril dose. There is no evidence that she is having an acute CVA, or an acute cardiac event. She has no significant increase in her creatinine. This is likely worsening of her essential hypertension and will require additional medication. For her urinary frequency, the patient does have a white blood cell count of 18 and we'll get a urinalysis for further evaluation.   ED COURSE: The patient does have a UTI will be treated with ciprofloxacin based on her prior allergic reactions. A urine culture has been sent. The patient's blood pressure improved with lisinopril and metoprolol, her home medications. Dr. Judithann Sheen is not on-call tonight, and I do feel that he should make the final decisions about her chronic antihypertensive regimen. I have discussed this with the patient as well as her daughters, we'll call Dr. Judithann Sheen office in the morning. Return precautions were discussed.  ____________________________________________  FINAL CLINICAL IMPRESSION(S) / ED DIAGNOSES  Final diagnoses:  Hypertension, unspecified type         NEW MEDICATIONS STARTED DURING THIS VISIT:  New Prescriptions   No medications on file      Rockne Menghini, MD 01/23/17 2314

## 2017-01-26 ENCOUNTER — Emergency Department
Admission: EM | Admit: 2017-01-26 | Discharge: 2017-01-26 | Disposition: A | Discharge status: Home / Self Care | Payer: Medicare Other | Attending: Emergency Medicine | Admitting: Emergency Medicine

## 2017-01-26 ENCOUNTER — Encounter: Payer: Self-pay | Admitting: Emergency Medicine

## 2017-01-26 DIAGNOSIS — Z7982 Long term (current) use of aspirin: Secondary | ICD-10-CM | POA: Diagnosis not present

## 2017-01-26 DIAGNOSIS — R110 Nausea: Secondary | ICD-10-CM

## 2017-01-26 DIAGNOSIS — N183 Chronic kidney disease, stage 3 unspecified: Secondary | ICD-10-CM

## 2017-01-26 DIAGNOSIS — Z79899 Other long term (current) drug therapy: Secondary | ICD-10-CM | POA: Diagnosis not present

## 2017-01-26 DIAGNOSIS — I130 Hypertensive heart and chronic kidney disease with heart failure and stage 1 through stage 4 chronic kidney disease, or unspecified chronic kidney disease: Secondary | ICD-10-CM | POA: Insufficient documentation

## 2017-01-26 DIAGNOSIS — Z8673 Personal history of transient ischemic attack (TIA), and cerebral infarction without residual deficits: Secondary | ICD-10-CM | POA: Diagnosis not present

## 2017-01-26 DIAGNOSIS — I2510 Atherosclerotic heart disease of native coronary artery without angina pectoris: Secondary | ICD-10-CM | POA: Diagnosis not present

## 2017-01-26 DIAGNOSIS — Z7984 Long term (current) use of oral hypoglycemic drugs: Secondary | ICD-10-CM | POA: Insufficient documentation

## 2017-01-26 DIAGNOSIS — I252 Old myocardial infarction: Secondary | ICD-10-CM | POA: Diagnosis not present

## 2017-01-26 DIAGNOSIS — Z87891 Personal history of nicotine dependence: Secondary | ICD-10-CM | POA: Insufficient documentation

## 2017-01-26 DIAGNOSIS — E039 Hypothyroidism, unspecified: Secondary | ICD-10-CM | POA: Diagnosis not present

## 2017-01-26 DIAGNOSIS — R112 Nausea with vomiting, unspecified: Secondary | ICD-10-CM | POA: Insufficient documentation

## 2017-01-26 DIAGNOSIS — I5023 Acute on chronic systolic (congestive) heart failure: Secondary | ICD-10-CM | POA: Diagnosis not present

## 2017-01-26 DIAGNOSIS — Z955 Presence of coronary angioplasty implant and graft: Secondary | ICD-10-CM | POA: Diagnosis not present

## 2017-01-26 DIAGNOSIS — E1122 Type 2 diabetes mellitus with diabetic chronic kidney disease: Secondary | ICD-10-CM | POA: Insufficient documentation

## 2017-01-26 LAB — COMPREHENSIVE METABOLIC PANEL
ALT: 16 U/L (ref 14–54)
AST: 24 U/L (ref 15–41)
Albumin: 3.3 g/dL — ABNORMAL LOW (ref 3.5–5.0)
Alkaline Phosphatase: 50 U/L (ref 38–126)
Anion gap: 7 (ref 5–15)
BUN: 29 mg/dL — ABNORMAL HIGH (ref 6–20)
CHLORIDE: 97 mmol/L — AB (ref 101–111)
CO2: 27 mmol/L (ref 22–32)
CREATININE: 1.19 mg/dL — AB (ref 0.44–1.00)
Calcium: 8.9 mg/dL (ref 8.9–10.3)
GFR calc non Af Amer: 37 mL/min — ABNORMAL LOW (ref >60)
GFR, EST AFRICAN AMERICAN: 43 mL/min — AB (ref >60)
Glucose, Bld: 169 mg/dL — ABNORMAL HIGH (ref 65–99)
Potassium: 4.1 mmol/L (ref 3.5–5.1)
SODIUM: 131 mmol/L — AB (ref 135–145)
Total Bilirubin: 0.5 mg/dL (ref 0.3–1.2)
Total Protein: 6.9 g/dL (ref 6.5–8.1)

## 2017-01-26 LAB — CBC
HEMATOCRIT: 32.7 % — AB (ref 35.0–47.0)
Hemoglobin: 10.9 g/dL — ABNORMAL LOW (ref 12.0–16.0)
MCH: 28.8 pg (ref 26.0–34.0)
MCHC: 33.4 g/dL (ref 32.0–36.0)
MCV: 86.1 fL (ref 80.0–100.0)
PLATELETS: 378 10*3/uL (ref 150–440)
RBC: 3.8 MIL/uL (ref 3.80–5.20)
RDW: 15.2 % — ABNORMAL HIGH (ref 11.5–14.5)
WBC: 12.3 10*3/uL — ABNORMAL HIGH (ref 3.6–11.0)

## 2017-01-26 LAB — URINALYSIS, COMPLETE (UACMP) WITH MICROSCOPIC
BACTERIA UA: NONE SEEN
Bilirubin Urine: NEGATIVE
Glucose, UA: 50 mg/dL — AB
Hgb urine dipstick: NEGATIVE
KETONES UR: NEGATIVE mg/dL
Leukocytes, UA: NEGATIVE
Nitrite: NEGATIVE
PH: 5 (ref 5.0–8.0)
PROTEIN: NEGATIVE mg/dL
Specific Gravity, Urine: 1.012 (ref 1.005–1.030)

## 2017-01-26 LAB — LIPASE, BLOOD: LIPASE: 53 U/L — AB (ref 11–51)

## 2017-01-26 MED ORDER — FAMOTIDINE 20 MG PO TABS: 20.0000 mg | ORAL_TABLET | Freq: Two times a day (BID) | ORAL | 0 refills | Status: DC | PRN

## 2017-01-26 MED ORDER — ONDANSETRON 4 MG PO TBDP
4.0000 mg | ORAL_TABLET | Freq: Once | ORAL | Status: AC | PRN
  Administered 2017-01-26: 4 mg via ORAL

## 2017-01-26 MED ORDER — FAMOTIDINE 20 MG PO TABS
40.0000 mg | ORAL_TABLET | Freq: Once | ORAL | Status: AC
Start: 2017-01-26 — End: 2017-01-26
  Administered 2017-01-26: 40 mg via ORAL
  Filled 2017-01-26: qty 2

## 2017-01-26 MED ORDER — ONDANSETRON 4 MG PO TBDP: 4.0000 mg | ORAL_TABLET | Freq: Three times a day (TID) | ORAL | 0 refills | Status: DC | PRN

## 2017-01-26 MED ORDER — ALUM & MAG HYDROXIDE-SIMETH 200-200-20 MG/5ML PO SUSP
30.0000 mL | Freq: Once | ORAL | Status: AC
  Administered 2017-01-26: 30 mL via ORAL
  Filled 2017-01-26: qty 30

## 2017-01-26 NOTE — ED Provider Notes (Signed)
[facility] Emergency Department Provider Note  ____________________________________________  Time seen: Approximately 3:47 PM  I have reviewed the triage vital signs and the nursing notes.   HISTORY  Chief Complaint Nausea and Abdominal Pain  Level 5 caveat:  Portions of the history and physical were unable to be obtained due to the patient's poor historian HPI NAKEMA BRUNETTE is a 83 y.o. female who complains of nausea for the past 2 days. She reports she had vomited yesterday once or twice, but none today. She's been on Cipro for the last couple of days for urinary tract infection. Urine culture from primary care was negative, so patient was instructed to stop the antibiotic. Last dose of Cipro was yesterday. Denies muscle aches or joint pains. Contrary to triage note she denies any abdominal pain to me. Denies chest pain or shortness of breath. Nausea had been constant, waxing waning without aggravating or alleviating factors and she was eating and drinking normally and urinating normally at home over the last 24 hours. Received Zofran in the ED and feels much better now. Denies dizziness or falls.  She has a follow-up appointment with her PCP in 3 days on May 15.     Past Medical History:  Diagnosis Date  . Acute on chronic systolic CHF (congestive heart failure) (HCC) 06/16/2014  . Anemia   . CAD (coronary artery disease) 8/15   s/p DES LCX and LAD   . Cardiomyopathy, ischemic 06/16/2014  . Diabetes mellitus without complication (HCC)   . Hypertension   . NSTEMI (non-ST elevated myocardial infarction) (HCC)   . Thyroid disease   . TIA (transient ischemic attack) 2010     Patient Active Problem List   Diagnosis Date Noted  . Chronic systolic CHF (congestive heart failure) (HCC) 03/04/2015  . Acute on chronic systolic CHF (congestive heart failure) (HCC) 06/16/2014  . Cardiomyopathy, ischemic 06/16/2014  . CAD, multiple vessel 06/15/2014  . Anemia  05/05/2014  . Hypothyroidism 05/05/2014  . 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/05/2014  . NSTEMI (non-ST elevated myocardial infarction) (HCC) 05/02/2014  . Hypertension 05/02/2014  . Pulmonary edema cardiac cause (HCC) 05/02/2014     Past Surgical History:  Procedure Laterality Date  . LEFT HEART CATHETERIZATION WITH CORONARY ANGIOGRAM N/A 05/03/2014   Procedure: LEFT HEART CATHETERIZATION WITH CORONARY ANGIOGRAM;  Surgeon: Peter M [place], MD;  Location: [facility] CATH LAB;  Service: Cardiovascular;  Laterality: N/A;  . PERCUTANEOUS CORONARY STENT INTERVENTION (PCI-S) Right 05/03/2014   Procedure: PERCUTANEOUS CORONARY STENT INTERVENTION (PCI-S);  Surgeon: Peter M [place], MD;  Location: [facility] CATH LAB;  Service: Cardiovascular;  Laterality: Right;  . PERCUTANEOUS CORONARY STENT INTERVENTION (PCI-S) N/A 05/04/2014   Procedure: PERCUTANEOUS CORONARY STENT INTERVENTION (PCI-S);  Surgeon: Peter M [place], MD;  Location: [facility] CATH LAB;  Service: Cardiovascular;  Laterality: N/A;     Prior to Admission medications   Medication Sig Start Date End Date Taking? Authorizing Provider  aspirin EC 81 MG tablet Take 81 mg by mouth daily.    [provider]  atorvastatin (LIPITOR) 40 MG tablet Take 1 tablet (40 mg total) by mouth daily at 6 PM. 02/27/16   [place], Peter M, MD  ciprofloxacin (CIPRO) 500 MG tablet Take 1 tablet (500 mg total) by mouth 2 (two) times daily. 01/23/17 01/30/17  Rockne Menghini, MD  clopidogrel (PLAVIX) 75 MG tablet Take 1 tablet (75 mg total) by mouth daily. 02/27/16   [place], Peter M, MD  famotidine (PEPCID) 20 MG tablet Take 1  tablet (20 mg total) by mouth 2 (two) times daily as needed (nausea or discomfort). 01/26/17 01/31/17  Sharman Cheek, MD  Flaxseed, Linseed, (FLAX SEED OIL PO) Take 1 tablet by mouth daily.    [provider]  furosemide (LASIX) 20 MG tablet Take 1 tablet (20 mg total) by mouth daily. 05/14/16   [place], Peter M, MD  L-Arginine 1000 MG TABS  Take 1 tablet by mouth 3 (three) times daily.    [provider]  levothyroxine (SYNTHROID, LEVOTHROID) 150 MCG tablet Take 150 mcg by mouth daily before breakfast.    [provider]  lisinopril (PRINIVIL,ZESTRIL) 5 MG tablet TAKE 1 TABLET BY MOUTH DAILY 10/08/16   [place], Peter M, MD  magnesium oxide (MAG-OX) 400 MG tablet Take 400 mg by mouth 2 (two) times daily.    [provider]  metFORMIN (GLUCOPHAGE) 500 MG tablet Take 500 mg by mouth 2 (two) times daily with a meal.    [provider]  metoprolol succinate (TOPROL-XL) 25 MG 24 hr tablet Take 1 tablet (25 mg total) by mouth daily. 02/27/16   [place], Peter M, MD  ondansetron (ZOFRAN ODT) 4 MG disintegrating tablet Take 1 tablet (4 mg total) by mouth every 8 (eight) hours as needed for nausea or vomiting. 01/26/17   Sharman Cheek, MD  pantoprazole (PROTONIX) 40 MG tablet Take 40 mg by mouth daily.    [provider]  ranitidine (ZANTAC) 150 MG tablet Take 150 mg by mouth daily.    [provider]     Allergies Cefuroxime axetil; Clonidine derivatives; Epinephrine; Penicillins; and Septra [sulfamethoxazole-trimethoprim]   No family history on file.  Social History Social History  Substance Use Topics  . Smoking status: Former Smoker    Types: Cigarettes  . Smokeless tobacco: Never Used  . Alcohol use No    Review of Systems  Constitutional:   No fever or chills.  ENT:   No sore throat. No rhinorrhea. Lymphatic: No swollen glands, No extremity swelling Endocrine: No hot/cold flashes. No significant weight change. No neck swelling. Cardiovascular:   No chest pain or syncope. Respiratory:   No dyspnea or cough. Gastrointestinal:   Negative for abdominal pain, positive nausea and vomiting. Normal regular bowel movements. Last BM today..  Genitourinary:   Negative for dysuria or difficulty urinating. Musculoskeletal:   Negative for focal pain or swelling Neurological:    Negative for headaches or weakness. No dizziness All other systems reviewed and are negative except as documented above in ROS and HPI.  ____________________________________________   PHYSICAL EXAM:  VITAL SIGNS: ED Triage Vitals  Enc Vitals Group     BP 01/26/17 1325 (!) 167/61     Pulse Rate 01/26/17 1325 83     Resp 01/26/17 1325 18     Temp 01/26/17 1325 98.8 F (37.1 C)     Temp Source 01/26/17 1325 Oral     SpO2 01/26/17 1325 96 %     Weight 01/26/17 1326 152 lb (68.9 kg)     Height 01/26/17 1326 5\' 2"  (1.575 m)     Head Circumference --      Peak Flow --      Pain Score 01/26/17 1330 6     Pain Loc --      Pain Edu? --      Excl. in GC? --     Vital signs reviewed, nursing assessments reviewed.   Constitutional:   Alert and oriented. Well appearing and in no distress. Eyes:  No scleral icterus. No conjunctival pallor. PERRL. EOMI.  No nystagmus. ENT   Head:   Normocephalic and atraumatic.   Nose:   No congestion/rhinnorhea. No septal hematoma   Mouth/Throat:   MMM, no pharyngeal erythema. No peritonsillar mass.    Neck:   No stridor. No SubQ emphysema. No meningismus. Hematological/Lymphatic/Immunilogical:   No cervical lymphadenopathy. Cardiovascular:   RRR. Symmetric bilateral radial and DP pulses.  No murmurs.  Respiratory:   Normal respiratory effort without tachypnea nor retractions. Breath sounds are clear and equal bilaterally. No wheezes/rales/rhonchi. Gastrointestinal:   Soft and nontender. Non distended. There is no CVA tenderness.  No rebound, rigidity, or guarding. Genitourinary:   deferred Musculoskeletal:   Normal range of motion in all extremities. No joint effusions.  No lower extremity tenderness.  No edema. Neurologic:   Normal speech and language.  CN 2-10 normal. Motor grossly intact. No gross focal neurologic deficits are appreciated.  Skin:    Skin is warm, dry and intact. No rash noted.  No petechiae, purpura, or  bullae.  ____________________________________________    LABS (pertinent positives/negatives) (all labs ordered are listed, but only abnormal results are displayed) Labs Reviewed  LIPASE, BLOOD - Abnormal; Notable for the following:       Result Value   Lipase 53 (*)    All other components within normal limits  COMPREHENSIVE METABOLIC PANEL - Abnormal; Notable for the following:    Sodium 131 (*)    Chloride 97 (*)    Glucose, Bld 169 (*)    BUN 29 (*)    Creatinine, Ser 1.19 (*)    Albumin 3.3 (*)    GFR calc non Af Amer 37 (*)    GFR calc Af Amer 43 (*)    All other components within normal limits  CBC - Abnormal; Notable for the following:    WBC 12.3 (*)    Hemoglobin 10.9 (*)    HCT 32.7 (*)    RDW 15.2 (*)    All other components within normal limits  URINALYSIS, COMPLETE (UACMP) WITH MICROSCOPIC - Abnormal; Notable for the following:    Color, Urine YELLOW (*)    APPearance CLEAR (*)    Glucose, UA 50 (*)    Squamous Epithelial / LPF 0-5 (*)    All other components within normal limits  URINE CULTURE   ____________________________________________   EKG    ____________________________________________    RADIOLOGY  No results found.  ____________________________________________   PROCEDURES Procedures  ____________________________________________   INITIAL IMPRESSION / ASSESSMENT AND PLAN / ED COURSE  Pertinent labs & imaging results that were available during my care of the patient were reviewed by me and considered in my medical decision making (see chart for details).  Patient well appearing no acute distress, complains of nausea and no other symptoms. Denies any pain. This is in the setting of taking Cipro for a few days for a urinary tract infection. Urine culture negative by PCP, patient stop taking Cipro yesterday. Symptoms well controlled with oral Zofran. We'll by mouth trial and plan for discharge home. Has follow-up in 3 days.  Encouraged to keep this appointment.  Considering the patient's symptoms, medical history, and physical examination today, I have low suspicion for cholecystitis or biliary pathology, pancreatitis, perforation or bowel obstruction, hernia, intra-abdominal abscess, AAA or dissection, volvulus or intussusception, mesenteric ischemia, or appendicitis.Low suspicion for ACS.    ----------------------------------------- 4:49 PM on 01/26/2017 -----------------------------------------  Patient feels completely asymptomatic. Tolerating oral intake. We'll discharge home,  follow up with primary care in 3 days. Return precautions given.         ____________________________________________   FINAL CLINICAL IMPRESSION(S) / ED DIAGNOSES  Final diagnoses:  Nausea  CKD (chronic kidney disease) stage 3, GFR 30-59 ml/min      New Prescriptions   FAMOTIDINE (PEPCID) 20 MG TABLET    Take 1 tablet (20 mg total) by mouth 2 (two) times daily as needed (nausea or discomfort).   ONDANSETRON (ZOFRAN ODT) 4 MG DISINTEGRATING TABLET    Take 1 tablet (4 mg total) by mouth every 8 (eight) hours as needed for nausea or vomiting.     Portions of this note were generated with dragon dictation software. Dictation errors may occur despite best attempts at proofreading.    Sharman Cheek, MD 01/26/17 [phone]

## 2017-01-26 NOTE — ED Triage Notes (Signed)
Patient presents to the ED with nausea and abdominal pain and vomiting for the past 2 days.  Patient states she has not vomited today.  Patient was put on cipro for a kidney infection on Wednesday and immediately became nasueous with occasional vomiting.  Patient's primary care doctor was contacted on Friday and told patient to stop taking the cipro.  Patient's last dose was Friday morning.  Patient states her abdominal pain is not severe but her nausea continues and is very uncomfortable.  Patient states the PCP is waiting to prescribe a new antibiotic until he has results from patient's urine culture.

## 2017-01-28 LAB — URINE CULTURE: Culture: NO GROWTH

## 2017-01-29 ENCOUNTER — Other Ambulatory Visit: Payer: Self-pay | Admitting: Internal Medicine

## 2017-01-29 DIAGNOSIS — R1084 Generalized abdominal pain: Secondary | ICD-10-CM

## 2017-02-06 ENCOUNTER — Ambulatory Visit
Admission: RE | Admit: 2017-02-06 | Discharge: 2017-02-06 | Disposition: A | Discharge status: Home / Self Care | Payer: Medicare Other | Source: Ambulatory Visit | Attending: Internal Medicine | Admitting: Internal Medicine

## 2017-02-06 DIAGNOSIS — K219 Gastro-esophageal reflux disease without esophagitis: Secondary | ICD-10-CM | POA: Insufficient documentation

## 2017-02-06 DIAGNOSIS — R1084 Generalized abdominal pain: Secondary | ICD-10-CM | POA: Diagnosis present

## 2017-02-15 ENCOUNTER — Ambulatory Visit: Payer: PPO | Admitting: Cardiology

## 2017-02-18 NOTE — Progress Notes (Signed)
Melinda Lewis Date of Birth: [birth year] Medical Record #[id number]  History of Present Illness: Melinda Lewis is seen for follow up CAD. She is a pleasant 83 yo WF with history of DM type 2, HTN, and hypercholesterolemia who was admitted from 8/16-8/19/15 with a NSTEMI. She presented with acute respiratory failure and was hypertensive.  By Echo EF was 35-40%. She underwent cardiac cath which demonstrated severe disease in the proximal and mid LAD and mid LCx. The RCA had an 80% stenosis in the mid vessel. She had stenting of the mid LCx, proximal and mid LAD with DES stents. Repeat Echo in Dec. 2015 showed normal EF. She was seen in September with atypical chest pain. Echo was unchanged.  She was seen in the ED in May with a UTI. She was given antibiotics which made her stomach upset. UGI series was benign. These symptoms have resolved.    On follow up today she continues to do very well. She is seen with her daughter.   No  SOB. No Chest pain. She is active and walks with a walker. She does note a lot of bruising on her arms.  Allergies as of 02/20/2017      Reactions   Cefuroxime Axetil Nausea Only   cramps   Clonidine Derivatives Other (See Comments)   Maker her pass out   Epinephrine    Penicillins    Septra [sulfamethoxazole-trimethoprim] Nausea And Vomiting      Medication List       Accurate as of 02/20/17  3:55 PM. Always use your most recent med list.          aspirin EC 81 MG tablet Take 81 mg by mouth daily.   atorvastatin 40 MG tablet Commonly known as:  LIPITOR Take 1 tablet (40 mg total) by mouth daily at 6 PM.   FLAX SEED OIL PO Take 1 tablet by mouth daily.   furosemide 20 MG tablet Commonly known as:  LASIX Take 1 tablet (20 mg total) by mouth daily.   L-Arginine 1000 MG Tabs Take 1 tablet by mouth 3 (three) times daily.   levothyroxine 100 MCG tablet Commonly known as:  SYNTHROID, LEVOTHROID TAKE 1 TABLET EVERY DAY ON EMPTY STOMACHWITH A GLASS OF  WATER AT LEAST 30-60 MINBEFORE BREAKFAST   lisinopril 5 MG tablet Commonly known as:  PRINIVIL,ZESTRIL TAKE 1 TABLET BY MOUTH DAILY   magnesium oxide 400 MG tablet Commonly known as:  MAG-OX Take 400 mg by mouth 2 (two) times daily.   metFORMIN 500 MG tablet Commonly known as:  GLUCOPHAGE Take 500 mg by mouth 2 (two) times daily with a meal.   metoprolol succinate 25 MG 24 hr tablet Commonly known as:  TOPROL-XL Take 1 tablet (25 mg total) by mouth daily.   pantoprazole 40 MG tablet Commonly known as:  PROTONIX Take 40 mg by mouth daily.   QUEtiapine 25 MG tablet Commonly known as:  SEROQUEL Take 1 tablet by mouth at bedtime.   ranitidine 150 MG tablet Commonly known as:  ZANTAC Take 150 mg by mouth daily.        Allergies  Allergen Reactions  . Cefuroxime Axetil Nausea Only    cramps  . Clonidine Derivatives Other (See Comments)    Maker her pass out  . Epinephrine   . Penicillins   . Septra [Sulfamethoxazole-Trimethoprim] Nausea And Vomiting    Past Medical History:  Diagnosis Date  . Acute on chronic systolic CHF (congestive heart failure) (HCC) 06/16/2014  .  Anemia   . CAD (coronary artery disease) 8/15   s/p DES LCX and LAD   . Cardiomyopathy, ischemic 06/16/2014  . Diabetes mellitus without complication (HCC)   . Hypertension   . NSTEMI (non-ST elevated myocardial infarction) (HCC)   . Thyroid disease   . TIA (transient ischemic attack) 2010    Past Surgical History:  Procedure Laterality Date  . LEFT HEART CATHETERIZATION WITH CORONARY ANGIOGRAM N/A 05/03/2014   Procedure: LEFT HEART CATHETERIZATION WITH CORONARY ANGIOGRAM;  Surgeon: Rannie Craney M [place], MD;  Location: [facility] CATH LAB;  Service: Cardiovascular;  Laterality: N/A;  . PERCUTANEOUS CORONARY STENT INTERVENTION (PCI-S) Right 05/03/2014   Procedure: PERCUTANEOUS CORONARY STENT INTERVENTION (PCI-S);  Surgeon: Yamil Dougher M [place], MD;  Location: [facility] CATH LAB;  Service: Cardiovascular;  Laterality: Right;    . PERCUTANEOUS CORONARY STENT INTERVENTION (PCI-S) N/A 05/04/2014   Procedure: PERCUTANEOUS CORONARY STENT INTERVENTION (PCI-S);  Surgeon: Efren Kross M [place], MD;  Location: [facility] CATH LAB;  Service: Cardiovascular;  Laterality: N/A;    Social History   Social History  . Marital status: Widowed    Spouse name: N/A  . Number of children: N/A  . Years of education: N/A   Social History Main Topics  . Smoking status: Former Smoker    Types: Cigarettes  . Smokeless tobacco: Never Used  . Alcohol use No  . Drug use: No  . Sexual activity: Not Asked   Other Topics Concern  . None   Social History Narrative  . None    History reviewed. No pertinent family history.  Review of Systems: As noted in HPI.  All other systems were reviewed and are negative.  Physical Exam: BP (!) 182/71   Pulse 79   Ht  (1.575 m)   Wt 156 lb 6.4 oz (70.9 kg)   BMI 28.61 kg/m  Filed Weights   02/20/17 1543  Weight: 156 lb 6.4 oz (70.9 kg)  GENERAL:  Well appearing elderly WF in NAD HEENT:  PERRL, EOMI, sclera are clear. Oropharynx is clear. NECK:  No jugular venous distention, carotid upstroke brisk and symmetric, no bruits, no thyromegaly or adenopathy LUNGS:  Clear to auscultation bilaterally CHEST:  Unremarkable HEART:  RRR,  PMI not displaced or sustained,S1 and S2 within normal limits, no S3, no S4: no clicks, no rubs, no murmurs ABD:  Soft, nontender. BS +, no masses or bruits. No hepatomegaly, no splenomegaly EXT:  2 + pulses throughout, no edema, no cyanosis no clubbing. Extensive bruising on arms. SKIN:  Warm and dry.  No rashes. Minor bruising. NEURO:  Alert and oriented x 3. Cranial nerves II through XII intact. PSYCH:  Cognitively intact    LABORATORY DATA:  Lab Results  Component Value Date   WBC 12.3 (H) 01/26/2017   HGB 10.9 (L) 01/26/2017   HCT 32.7 (L) 01/26/2017   PLT 378 01/26/2017   GLUCOSE 169 (H) 01/26/2017   CHOL 157 05/02/2014   TRIG 164 (H) 05/02/2014   HDL  50 05/02/2014   LDLCALC 74 05/02/2014   ALT 16 01/26/2017   AST 24 01/26/2017   NA 131 (L) 01/26/2017   K 4.1 01/26/2017   CL 97 (L) 01/26/2017   CREATININE 1.19 (H) 01/26/2017   BUN 29 (H) 01/26/2017   CO2 27 01/26/2017   INR 1.04 05/02/2014    Labs from primary care dated 09/05/16: CBC with Hgb 12.3 WBC 10.7, BUN 24, creatinine 1.1. Glucose 118. Other chemistries normal. A1c 6.6%. TSH 9.7. Cholesterol 148, trig- 150,  HDL 41, LDL 77.   Echo 06/14/16: Study Conclusions  - Left ventricle: The cavity size was normal. Wall thickness was   increased in a pattern of mild LVH. Systolic function was normal.   The estimated ejection fraction was in the range of 60% to 65%.   Wall motion was normal; there were no regional wall motion   abnormalities. Doppler parameters are consistent with abnormal   left ventricular relaxation (grade 1 diastolic dysfunction). - Mitral valve: Calcified annulus. - Tricuspid valve: There was mild regurgitation. - Pulmonary arteries: Systolic pressure was mildly increased. PA   peak pressure: 39 mm Hg (S).  Impressions:  - Compared to the prior study, there has been no significant   interval change.  Assessment / Plan: 1. CAD - S/p complex stenting of the proximal and mid LAD, Mid LCx with DESs in August 2015. Moderate RCA disease that will be treated medically. With recent anemia and bruising I have recommended stopping Plavix at this time.  She is otherwise asymptomatic and doing very well. Follow up in 6 months.  2. Chronic systolic CHF due to ischemic cardiomyopathy. Continue ACEi, metoprolol, lasix. Sodium restriction. Echo in September 2017 showed normal LV function.  3. Hypercholesterolemia. Well controlled on statin.  4. HTN BP is elevated today but patient was rushing because she was late for appt. Will monitor.  5. DM type 2.

## 2017-02-20 ENCOUNTER — Encounter: Payer: Self-pay | Admitting: Cardiology

## 2017-02-20 ENCOUNTER — Ambulatory Visit (INDEPENDENT_AMBULATORY_CARE_PROVIDER_SITE_OTHER): Payer: Medicare Other | Admitting: Cardiology

## 2017-02-20 VITALS — BP 182/71 | HR 79 | Ht 62.0 in | Wt 156.4 lb

## 2017-02-20 DIAGNOSIS — I25118 Atherosclerotic heart disease of native coronary artery with other forms of angina pectoris: Secondary | ICD-10-CM

## 2017-02-20 DIAGNOSIS — E7800 Pure hypercholesterolemia, unspecified: Secondary | ICD-10-CM

## 2017-02-20 DIAGNOSIS — I10 Essential (primary) hypertension: Secondary | ICD-10-CM

## 2017-02-20 NOTE — Patient Instructions (Addendum)
Stop taking Plavix. Continue your other therapy  I will see you in 6 months.    

## 2017-03-15 ENCOUNTER — Other Ambulatory Visit: Payer: Self-pay | Admitting: Cardiology

## 2017-04-08 ENCOUNTER — Other Ambulatory Visit: Payer: Self-pay | Admitting: Cardiology

## 2017-04-08 NOTE — Telephone Encounter (Signed)
REFILL 

## 2017-05-14 ENCOUNTER — Other Ambulatory Visit: Payer: Self-pay | Admitting: Cardiology

## 2017-07-01 ENCOUNTER — Other Ambulatory Visit: Payer: Self-pay | Admitting: Cardiology

## 2017-07-01 NOTE — Telephone Encounter (Signed)
REFILL 

## 2017-08-05 ENCOUNTER — Encounter: Payer: Self-pay | Admitting: Cardiology

## 2017-08-12 NOTE — Progress Notes (Signed)
Melinda Lewis Date of Birth: [birth year] Medical Record #[id number]  History of Present Illness: Melinda Lewis is seen for follow up CAD. She is a pleasant 83 yo WF with history of DM type 2, HTN, and hypercholesterolemia who was admitted from 8/16-8/19/15 with a NSTEMI. She presented with acute respiratory failure and was hypertensive.  By Echo EF was 35-40%. She underwent cardiac cath which demonstrated severe disease in the proximal and mid LAD and mid LCx. The RCA had an 80% stenosis in the mid vessel. She had stenting of the mid LCx, proximal and mid LAD with DES stents. Repeat Echo in Dec. 2015 showed normal EF. She was seen in September 2017 with atypical chest pain. Echo was unchanged.   On follow up today she continues to do very well. She is seen with her daughter.   She denies any chest pain or SOB. Sometimes she feels unsteady when she first gets up. She walks with a walker. Her legs do fatigue easily. She was seen by Dr. Judithann Sheen in June and September with normal BP readings. She admits she ate a lot of salty foods over Thanksgiving.    Allergies as of 08/15/2017      Reactions   Cefuroxime Axetil Nausea Only   cramps   Clonidine Derivatives Other (See Comments)   Maker her pass out   Epinephrine    Penicillins    Septra [sulfamethoxazole-trimethoprim] Nausea And Vomiting      Medication List        Accurate as of 08/15/17 11:46 AM. Always use your most recent med list.          aspirin EC 81 MG tablet Take 81 mg by mouth daily.   atorvastatin 40 MG tablet Commonly known as:  LIPITOR TAKE 1 TABLET BY MOUTH DAILY AT 6PM   FLAX SEED OIL PO Take 1 tablet by mouth daily.   furosemide 20 MG tablet Commonly known as:  LASIX TAKE 1 TABLET BY MOUTH EVERY DAY   L-Arginine 1000 MG Tabs Take 1 tablet by mouth 3 (three) times daily.   levothyroxine 100 MCG tablet Commonly known as:  SYNTHROID, LEVOTHROID TAKE 1 TABLET EVERY DAY ON EMPTY STOMACHWITH A GLASS OF WATER AT  LEAST 30-60 MINBEFORE BREAKFAST   lisinopril 5 MG tablet Commonly known as:  PRINIVIL,ZESTRIL TAKE 1 TABLET BY MOUTH DAILY   magnesium oxide 400 MG tablet Commonly known as:  MAG-OX Take 400 mg by mouth 2 (two) times daily.   metFORMIN 500 MG tablet Commonly known as:  GLUCOPHAGE Take 500 mg by mouth 2 (two) times daily with a meal.   metoprolol succinate 25 MG 24 hr tablet Commonly known as:  TOPROL-XL TAKE 1 TABLET BY MOUTH EVERY DAY   pantoprazole 40 MG tablet Commonly known as:  PROTONIX Take 40 mg by mouth daily.   QUEtiapine 25 MG tablet Commonly known as:  SEROQUEL Take 1 tablet by mouth at bedtime.        Allergies  Allergen Reactions  . Cefuroxime Axetil Nausea Only    cramps  . Clonidine Derivatives Other (See Comments)    Maker her pass out  . Epinephrine   . Penicillins   . Septra [Sulfamethoxazole-Trimethoprim] Nausea And Vomiting    Past Medical History:  Diagnosis Date  . Acute on chronic systolic CHF (congestive heart failure) (HCC) 06/16/2014  . Anemia   . CAD (coronary artery disease) 8/15   s/p DES LCX and LAD   . Cardiomyopathy, ischemic 06/16/2014  .  Diabetes mellitus without complication (HCC)   . Hypertension   . NSTEMI (non-ST elevated myocardial infarction) (HCC)   . Thyroid disease   . TIA (transient ischemic attack) 2010    Past Surgical History:  Procedure Laterality Date  . LEFT HEART CATHETERIZATION WITH CORONARY ANGIOGRAM N/A 05/03/2014   Procedure: LEFT HEART CATHETERIZATION WITH CORONARY ANGIOGRAM;  Surgeon: Starlyn Droge M SwazilandJordan, MD;  [facility];  Service: Cardiovascular;  Laterality: N/A;  . PERCUTANEOUS CORONARY STENT INTERVENTION (PCI-S) Right 05/03/2014   Procedure: PERCUTANEOUS CORONARY STENT INTERVENTION (PCI-S);  Surgeon: Jadd Gasior M [place]Jordan, MD;  [facility];  Service: Cardiovascular;  Laterality: Right;  . PERCUTANEOUS CORONARY STENT INTERVENTION (PCI-S) N/A 05/04/2014   Procedure: PERCUTANEOUS CORONARY STENT  INTERVENTION (PCI-S);  Surgeon: Ein Rijo M [place]Jordan, MD;  [facility];  Service: Cardiovascular;  Laterality: N/A;    Social History   Socioeconomic History  . Marital status: Widowed    Spouse name: None  . Number of children: None  . Years of education: None  . Highest education level: None  Social Needs  . Financial resource strain: None  . Food insecurity - worry: None  . Food insecurity - inability: None  . Transportation needs - medical: None  . Transportation needs - non-medical: None  Occupational History  . None  Tobacco Use  . Smoking status: Former Smoker    Types: Cigarettes  . Smokeless tobacco: Never Used  Substance and Sexual Activity  . Alcohol use: No  . Drug use: No  . Sexual activity: None  Other Topics Concern  . None  Social History Narrative  . None    History reviewed. No pertinent family history.  Review of Systems: As noted in HPI.  All other systems were reviewed and are negative.  Physical Exam: BP (!) 185/75   Pulse 72   Ht 5\' 2"  (1.575 m)   Wt 160 lb 6.4 oz (72.8 kg)   SpO2 95%   BMI 29.34 kg/m  Filed Weights   08/15/17 1119  Weight: 160 lb 6.4 oz (72.8 kg)  GENERAL:  Well appearing elderly WF in NAD HEENT:  PERRL, EOMI, sclera are clear. Oropharynx is clear. NECK:  No jugular venous distention, carotid upstroke brisk and symmetric, no bruits, no thyromegaly or adenopathy LUNGS:  Clear to auscultation bilaterally CHEST:  Unremarkable HEART:  RRR,  PMI not displaced or sustained,S1 and S2 within normal limits, no S3, no S4: no clicks, no rubs, no murmurs ABD:  Soft, nontender. BS +, no masses or bruits. No hepatomegaly, no splenomegaly EXT:  2 + pulses throughout, no edema, no cyanosis no clubbing.  SKIN:  Warm and dry.  No rashes. Minor bruising. NEURO:  Alert and oriented x 3. Cranial nerves II through XII intact. PSYCH:  Cognitively intact    LABORATORY DATA: Labs from primary care dated 09/05/16: CBC with Hgb 12.3  WBC 10.7, BUN 24, creatinine 1.1. Glucose 118. Other chemistries normal. A1c 6.6%. TSH 9.7. Cholesterol 148, trig- 150, HDL 41, LDL 77.  Dated 03/04/17: Hgb 10.8, WBC 12.4. Potassium 5.3. BUN 30, creatinine 1.1. Other chemistries normal. Glucose 98.  Echo 06/14/16: Study Conclusions  - Left ventricle: The cavity size was normal. Wall thickness was   increased in a pattern of mild LVH. Systolic function was normal.   The estimated ejection fraction was in the range of 60% to 65%.   Wall motion was normal; there were no regional wall motion   abnormalities. Doppler parameters are consistent  with abnormal   left ventricular relaxation (grade 1 diastolic dysfunction). - Mitral valve: Calcified annulus. - Tricuspid valve: There was mild regurgitation. - Pulmonary arteries: Systolic pressure was mildly increased. PA   peak pressure: 39 mm Hg (S).  Impressions:  - Compared to the prior study, there has been no significant   interval change.  Assessment / Plan: 1. CAD - S/p complex stenting of the proximal and mid LAD, Mid LCx with DESs in August 2015. Moderate RCA disease that has been treated medically. She is asymptomatic. We stopped Plavix due to increased bruising.  She really looks great for 83 yo.  Follow up in 6 months.  2. Chronic systolic CHF due to ischemic cardiomyopathy. Continue ACEi, metoprolol, lasix. Reinforced need for sodium restriction. Echo in September 2017 showed normal LV function.  3. Hypercholesterolemia. Well controlled on statin.  4. HTN elevated today but readings with primary care have been normal.  5. DM type 2.

## 2017-08-15 ENCOUNTER — Ambulatory Visit: Payer: Medicare Other | Admitting: Cardiology

## 2017-08-15 ENCOUNTER — Encounter: Payer: Self-pay | Admitting: Cardiology

## 2017-08-15 VITALS — BP 185/75 | HR 72 | Ht 62.0 in | Wt 160.4 lb

## 2017-08-15 DIAGNOSIS — E7800 Pure hypercholesterolemia, unspecified: Secondary | ICD-10-CM

## 2017-08-15 DIAGNOSIS — I25118 Atherosclerotic heart disease of native coronary artery with other forms of angina pectoris: Secondary | ICD-10-CM | POA: Diagnosis not present

## 2017-08-15 DIAGNOSIS — E119 Type 2 diabetes mellitus without complications: Secondary | ICD-10-CM

## 2017-08-15 DIAGNOSIS — I10 Essential (primary) hypertension: Secondary | ICD-10-CM | POA: Diagnosis not present

## 2017-08-15 NOTE — Patient Instructions (Signed)
Continue your current therapy  I will see you in 6 months.   

## 2017-09-08 ENCOUNTER — Emergency Department: Payer: Medicare Other

## 2017-09-08 ENCOUNTER — Encounter: Payer: Self-pay | Admitting: Emergency Medicine

## 2017-09-08 ENCOUNTER — Observation Stay
Admission: EM | Admit: 2017-09-08 | Discharge: 2017-09-09 | Disposition: A | Discharge status: Home / Self Care | Payer: Medicare Other | Attending: Internal Medicine | Admitting: Internal Medicine

## 2017-09-08 ENCOUNTER — Other Ambulatory Visit: Payer: Self-pay

## 2017-09-08 DIAGNOSIS — I714 Abdominal aortic aneurysm, without rupture: Secondary | ICD-10-CM | POA: Insufficient documentation

## 2017-09-08 DIAGNOSIS — E785 Hyperlipidemia, unspecified: Secondary | ICD-10-CM | POA: Diagnosis not present

## 2017-09-08 DIAGNOSIS — E039 Hypothyroidism, unspecified: Secondary | ICD-10-CM | POA: Diagnosis not present

## 2017-09-08 DIAGNOSIS — R4182 Altered mental status, unspecified: Secondary | ICD-10-CM

## 2017-09-08 DIAGNOSIS — R112 Nausea with vomiting, unspecified: Secondary | ICD-10-CM | POA: Diagnosis not present

## 2017-09-08 DIAGNOSIS — Z882 Allergy status to sulfonamides status: Secondary | ICD-10-CM | POA: Diagnosis not present

## 2017-09-08 DIAGNOSIS — Z888 Allergy status to other drugs, medicaments and biological substances status: Secondary | ICD-10-CM | POA: Insufficient documentation

## 2017-09-08 DIAGNOSIS — Z881 Allergy status to other antibiotic agents status: Secondary | ICD-10-CM | POA: Insufficient documentation

## 2017-09-08 DIAGNOSIS — M19011 Primary osteoarthritis, right shoulder: Secondary | ICD-10-CM | POA: Diagnosis not present

## 2017-09-08 DIAGNOSIS — E860 Dehydration: Secondary | ICD-10-CM | POA: Insufficient documentation

## 2017-09-08 DIAGNOSIS — Z7982 Long term (current) use of aspirin: Secondary | ICD-10-CM | POA: Insufficient documentation

## 2017-09-08 DIAGNOSIS — I700 Atherosclerosis of aorta: Secondary | ICD-10-CM | POA: Diagnosis not present

## 2017-09-08 DIAGNOSIS — I255 Ischemic cardiomyopathy: Secondary | ICD-10-CM | POA: Diagnosis not present

## 2017-09-08 DIAGNOSIS — Z9889 Other specified postprocedural states: Secondary | ICD-10-CM | POA: Insufficient documentation

## 2017-09-08 DIAGNOSIS — I10 Essential (primary) hypertension: Secondary | ICD-10-CM | POA: Diagnosis present

## 2017-09-08 DIAGNOSIS — D72829 Elevated white blood cell count, unspecified: Secondary | ICD-10-CM | POA: Insufficient documentation

## 2017-09-08 DIAGNOSIS — Z955 Presence of coronary angioplasty implant and graft: Secondary | ICD-10-CM | POA: Insufficient documentation

## 2017-09-08 DIAGNOSIS — Z880 Allergy status to penicillin: Secondary | ICD-10-CM | POA: Diagnosis not present

## 2017-09-08 DIAGNOSIS — Z87891 Personal history of nicotine dependence: Secondary | ICD-10-CM | POA: Insufficient documentation

## 2017-09-08 DIAGNOSIS — I2510 Atherosclerotic heart disease of native coronary artery without angina pectoris: Secondary | ICD-10-CM | POA: Diagnosis not present

## 2017-09-08 DIAGNOSIS — I5022 Chronic systolic (congestive) heart failure: Secondary | ICD-10-CM | POA: Insufficient documentation

## 2017-09-08 DIAGNOSIS — I110 Hypertensive heart disease with heart failure: Secondary | ICD-10-CM | POA: Diagnosis not present

## 2017-09-08 DIAGNOSIS — E119 Type 2 diabetes mellitus without complications: Secondary | ICD-10-CM | POA: Diagnosis not present

## 2017-09-08 DIAGNOSIS — Z79899 Other long term (current) drug therapy: Secondary | ICD-10-CM | POA: Insufficient documentation

## 2017-09-08 DIAGNOSIS — Z8673 Personal history of transient ischemic attack (TIA), and cerebral infarction without residual deficits: Secondary | ICD-10-CM | POA: Insufficient documentation

## 2017-09-08 DIAGNOSIS — I252 Old myocardial infarction: Secondary | ICD-10-CM | POA: Insufficient documentation

## 2017-09-08 DIAGNOSIS — Z79890 Hormone replacement therapy (postmenopausal): Secondary | ICD-10-CM | POA: Insufficient documentation

## 2017-09-08 DIAGNOSIS — M19012 Primary osteoarthritis, left shoulder: Secondary | ICD-10-CM | POA: Insufficient documentation

## 2017-09-08 DIAGNOSIS — Z7984 Long term (current) use of oral hypoglycemic drugs: Secondary | ICD-10-CM | POA: Insufficient documentation

## 2017-09-08 DIAGNOSIS — Z8249 Family history of ischemic heart disease and other diseases of the circulatory system: Secondary | ICD-10-CM | POA: Insufficient documentation

## 2017-09-08 LAB — GLUCOSE, CAPILLARY: GLUCOSE-CAPILLARY: 151 mg/dL — AB (ref 65–99)

## 2017-09-08 LAB — COMPREHENSIVE METABOLIC PANEL
ALBUMIN: 3.9 g/dL (ref 3.5–5.0)
ALT: 15 U/L (ref 14–54)
ANION GAP: 11 (ref 5–15)
AST: 27 U/L (ref 15–41)
Alkaline Phosphatase: 68 U/L (ref 38–126)
BILIRUBIN TOTAL: 0.4 mg/dL (ref 0.3–1.2)
BUN: 29 mg/dL — ABNORMAL HIGH (ref 6–20)
CO2: 23 mmol/L (ref 22–32)
Calcium: 9.4 mg/dL (ref 8.9–10.3)
Chloride: 103 mmol/L (ref 101–111)
Creatinine, Ser: 1.18 mg/dL — ABNORMAL HIGH (ref 0.44–1.00)
GFR, EST AFRICAN AMERICAN: 43 mL/min — AB (ref >60)
GFR, EST NON AFRICAN AMERICAN: 37 mL/min — AB (ref >60)
Glucose, Bld: 186 mg/dL — ABNORMAL HIGH (ref 65–99)
POTASSIUM: 3.8 mmol/L (ref 3.5–5.1)
Sodium: 137 mmol/L (ref 135–145)
TOTAL PROTEIN: 7.4 g/dL (ref 6.5–8.1)

## 2017-09-08 LAB — CBC WITH DIFFERENTIAL/PLATELET
BASOS ABS: 0.1 10*3/uL (ref 0–0.1)
Basophils Relative: 1 %
Eosinophils Absolute: 0.1 10*3/uL (ref 0–0.7)
Eosinophils Relative: 1 %
HEMATOCRIT: 34.1 % — AB (ref 35.0–47.0)
HEMOGLOBIN: 10.7 g/dL — AB (ref 12.0–16.0)
LYMPHS PCT: 15 %
Lymphs Abs: 1.6 10*3/uL (ref 1.0–3.6)
MCH: 26.5 pg (ref 26.0–34.0)
MCHC: 31.3 g/dL — ABNORMAL LOW (ref 32.0–36.0)
MCV: 84.8 fL (ref 80.0–100.0)
MONO ABS: 0.6 10*3/uL (ref 0.2–0.9)
MONOS PCT: 6 %
NEUTROS ABS: 8.7 10*3/uL — AB (ref 1.4–6.5)
NEUTROS PCT: 77 %
Platelets: 215 10*3/uL (ref 150–440)
RBC: 4.02 MIL/uL (ref 3.80–5.20)
RDW: 16 % — AB (ref 11.5–14.5)
WBC: 11.1 10*3/uL — ABNORMAL HIGH (ref 3.6–11.0)

## 2017-09-08 LAB — URINALYSIS, COMPLETE (UACMP) WITH MICROSCOPIC
BILIRUBIN URINE: NEGATIVE
Bacteria, UA: NONE SEEN
Glucose, UA: 50 mg/dL — AB
HGB URINE DIPSTICK: NEGATIVE
Ketones, ur: NEGATIVE mg/dL
Leukocytes, UA: NEGATIVE
NITRITE: NEGATIVE
PH: 6 (ref 5.0–8.0)
Protein, ur: NEGATIVE mg/dL
SPECIFIC GRAVITY, URINE: 1.014 (ref 1.005–1.030)
Squamous Epithelial / LPF: NONE SEEN

## 2017-09-08 LAB — PROTIME-INR
INR: 0.94
Prothrombin Time: 12.5 seconds (ref 11.4–15.2)

## 2017-09-08 LAB — TROPONIN I

## 2017-09-08 LAB — LIPASE, BLOOD: LIPASE: 30 U/L (ref 11–51)

## 2017-09-08 MED ORDER — ACETAMINOPHEN 650 MG RE SUPP
650.0000 mg | Freq: Four times a day (QID) | RECTAL | Status: DC | PRN
Start: 2017-09-08 — End: 2017-09-09

## 2017-09-08 MED ORDER — ONDANSETRON HCL 4 MG/2ML IJ SOLN
4.0000 mg | Freq: Four times a day (QID) | INTRAMUSCULAR | Status: DC | PRN
  Administered 2017-09-09: 4 mg via INTRAVENOUS
  Filled 2017-09-08: qty 2

## 2017-09-08 MED ORDER — PANTOPRAZOLE SODIUM 40 MG PO TBEC
40.0000 mg | DELAYED_RELEASE_TABLET | Freq: Two times a day (BID) | ORAL | Status: DC
  Administered 2017-09-09: 40 mg via ORAL
  Filled 2017-09-08: qty 1

## 2017-09-08 MED ORDER — PNEUMOCOCCAL VAC POLYVALENT 25 MCG/0.5ML IJ INJ
0.5000 mL | INJECTION | INTRAMUSCULAR | Status: DC
  Filled 2017-09-08: qty 0.5

## 2017-09-08 MED ORDER — METOPROLOL SUCCINATE ER 25 MG PO TB24: 25.0000 mg | ORAL_TABLET | Freq: Every day | ORAL | Status: DC

## 2017-09-08 MED ORDER — LABETALOL HCL 5 MG/ML IV SOLN
INTRAVENOUS | Status: AC
  Filled 2017-09-08: qty 4

## 2017-09-08 MED ORDER — ASPIRIN EC 81 MG PO TBEC
81.0000 mg | DELAYED_RELEASE_TABLET | Freq: Every day | ORAL | Status: DC
  Administered 2017-09-09: 09:00:00 81 mg via ORAL
  Filled 2017-09-08: qty 1

## 2017-09-08 MED ORDER — LEVOTHYROXINE SODIUM 100 MCG PO TABS
100.0000 ug | ORAL_TABLET | Freq: Every day | ORAL | Status: DC
  Administered 2017-09-09: 08:00:00 100 ug via ORAL
  Filled 2017-09-08: qty 1

## 2017-09-08 MED ORDER — ATORVASTATIN CALCIUM 20 MG PO TABS: 40.0000 mg | ORAL_TABLET | Freq: Every day | ORAL | Status: DC

## 2017-09-08 MED ORDER — SODIUM CHLORIDE 0.9 % IV SOLN
INTRAVENOUS | Status: DC
  Administered 2017-09-08: 21:00:00 via INTRAVENOUS

## 2017-09-08 MED ORDER — QUETIAPINE FUMARATE 25 MG PO TABS
25.0000 mg | ORAL_TABLET | Freq: Every day | ORAL | Status: DC
  Administered 2017-09-08: 22:00:00 25 mg via ORAL
  Filled 2017-09-08: qty 1

## 2017-09-08 MED ORDER — SODIUM CHLORIDE 0.9 % IV SOLN
Freq: Once | INTRAVENOUS | Status: AC
  Administered 2017-09-08: 15:00:00 via INTRAVENOUS

## 2017-09-08 MED ORDER — IOPAMIDOL (ISOVUE-300) INJECTION 61%
75.0000 mL | Freq: Once | INTRAVENOUS | Status: AC | PRN
  Administered 2017-09-08: 75 mL via INTRAVENOUS

## 2017-09-08 MED ORDER — LABETALOL HCL 5 MG/ML IV SOLN
10.0000 mg | Freq: Once | INTRAVENOUS | Status: AC
  Administered 2017-09-08: 10 mg via INTRAVENOUS

## 2017-09-08 MED ORDER — METOPROLOL TARTRATE 5 MG/5ML IV SOLN
INTRAVENOUS | Status: DC
Start: 2017-09-08 — End: 2017-09-08
  Filled 2017-09-08: qty 5

## 2017-09-08 MED ORDER — INSULIN ASPART 100 UNIT/ML ~~LOC~~ SOLN: 0.0000 [IU] | Freq: Every day | SUBCUTANEOUS | Status: DC

## 2017-09-08 MED ORDER — ACETAMINOPHEN 325 MG PO TABS: 650.0000 mg | ORAL_TABLET | Freq: Four times a day (QID) | ORAL | Status: DC | PRN

## 2017-09-08 MED ORDER — METOPROLOL TARTRATE 5 MG/5ML IV SOLN
5.0000 mg | INTRAVENOUS | Status: DC | PRN
  Administered 2017-09-09 (×2): 5 mg via INTRAVENOUS
  Filled 2017-09-08 (×2): qty 5

## 2017-09-08 MED ORDER — MAGNESIUM OXIDE 400 (241.3 MG) MG PO TABS
400.0000 mg | ORAL_TABLET | Freq: Two times a day (BID) | ORAL | Status: DC
  Administered 2017-09-09: 09:00:00 400 mg via ORAL
  Filled 2017-09-08: qty 1

## 2017-09-08 MED ORDER — ENOXAPARIN SODIUM 30 MG/0.3ML ~~LOC~~ SOLN
30.0000 mg | SUBCUTANEOUS | Status: DC
  Administered 2017-09-08: 22:00:00 30 mg via SUBCUTANEOUS
  Filled 2017-09-08: qty 0.3

## 2017-09-08 MED ORDER — LISINOPRIL 5 MG PO TABS: 5.0000 mg | ORAL_TABLET | Freq: Every day | ORAL | Status: DC

## 2017-09-08 MED ORDER — PROMETHAZINE HCL 25 MG/ML IJ SOLN
12.5000 mg | Freq: Once | INTRAMUSCULAR | Status: AC
  Administered 2017-09-08: 12.5 mg via INTRAVENOUS
  Filled 2017-09-08: qty 1

## 2017-09-08 MED ORDER — INSULIN ASPART 100 UNIT/ML ~~LOC~~ SOLN
0.0000 [IU] | Freq: Three times a day (TID) | SUBCUTANEOUS | Status: DC
  Administered 2017-09-09: 12:00:00 1 [IU] via SUBCUTANEOUS
  Filled 2017-09-08: qty 1

## 2017-09-08 MED ORDER — FUROSEMIDE 40 MG PO TABS
20.0000 mg | ORAL_TABLET | Freq: Every day | ORAL | Status: DC
  Administered 2017-09-09: 09:00:00 20 mg via ORAL
  Filled 2017-09-08: qty 1

## 2017-09-08 MED ORDER — ONDANSETRON HCL 4 MG PO TABS: 4.0000 mg | ORAL_TABLET | Freq: Four times a day (QID) | ORAL | Status: DC | PRN

## 2017-09-08 NOTE — H&P (Signed)
[facility] [place] [facility]   PATIENT NAME: Melinda GoodnessMildred Lewis    MR#:  [id number]  DATE OF BIRTH:  [birth year]  DATE OF ADMISSION:  09/08/2017  PRIMARY CARE PHYSICIAN: Marguarite ArbourSparks, Jeffrey D, MD   REQUESTING/REFERRING PHYSICIAN: Dorothea GlassmanPaul Malinda  CHIEF COMPLAINT:   Nausea and vomiting with elevated blood pressures HISTORY OF PRESENT ILLNESS:  Melinda GoodnessMildred Melinda Lewis  is a 83 y.o. female with a known history of acute on chronic systolic congestive heart failure, hypertension, cardiomyopathy, coronary artery disease, diabetes mellitus and hypertension and other medical problems is brought into the ED for intractable nausea and vomiting associated with high blood pressure today afternoon. Patient went with her family members to eat lunch outside and she started feeling nauseous and started vomiting after she ate salad. Denies any hematemesis or abdominal pain. EMS gave her Zofran and brought her into the ED. In the ED patient systolic blood pressure was at around 230, IV labetalol was given and blood pressure came down to 170s. Hospitalist team is called to admit the patient.  PAST MEDICAL HISTORY:   Past Medical History:  Diagnosis Date  . Acute on chronic systolic CHF (congestive heart failure) (HCC) 06/16/2014  . Anemia   . CAD (coronary artery disease) 8/15   s/p DES LCX and LAD   . Cardiomyopathy, ischemic 06/16/2014  . Diabetes mellitus without complication (HCC)   . Hypertension   . NSTEMI (non-ST elevated myocardial infarction) (HCC)   . Thyroid disease   . TIA (transient ischemic attack) 2010    PAST SURGICAL HISTOIRY:   Past Surgical History:  Procedure Laterality Date  . LEFT HEART CATHETERIZATION WITH CORONARY ANGIOGRAM N/A 05/03/2014   Procedure: LEFT HEART CATHETERIZATION WITH CORONARY ANGIOGRAM;  Surgeon: Peter M [place]Jordan, MD;  [facility];  Service: Cardiovascular;  Laterality: N/A;  . PERCUTANEOUS CORONARY STENT INTERVENTION (PCI-S) Right  05/03/2014   Procedure: PERCUTANEOUS CORONARY STENT INTERVENTION (PCI-S);  Surgeon: Peter M [place]Jordan, MD;  [facility];  Service: Cardiovascular;  Laterality: Right;  . PERCUTANEOUS CORONARY STENT INTERVENTION (PCI-S) N/A 05/04/2014   Procedure: PERCUTANEOUS CORONARY STENT INTERVENTION (PCI-S);  Surgeon: Peter M [place]Jordan, MD;  [facility];  Service: Cardiovascular;  Laterality: N/A;    SOCIAL HISTORY:   Social History   Tobacco Use  . Smoking status: Former Smoker    Types: Cigarettes  . Smokeless tobacco: Never Used  Substance Use Topics  . Alcohol use: No    FAMILY HISTORY:  History reviewed. No pertinent family history.  DRUG ALLERGIES:   Allergies  Allergen Reactions  . Cefuroxime Axetil Nausea Only    cramps  . Clonidine Derivatives Other (See Comments)    Maker her pass out  . Epinephrine   . Penicillins   . Septra [Sulfamethoxazole-Trimethoprim] Nausea And Vomiting    REVIEW OF SYSTEMS:  CONSTITUTIONAL: No fever, fatigue or weakness.  EYES: No blurred or double vision.  EARS, NOSE, AND THROAT: No tinnitus or ear pain.  RESPIRATORY: No cough, shortness of breath, wheezing or hemoptysis.  CARDIOVASCULAR: No chest pain, orthopnea, edema.  GASTROINTESTINAL:  improving nausea, vomiting,  denies diarrhea or abdominal pain.  GENITOURINARY: No dysuria, hematuria.  ENDOCRINE: No polyuria, nocturia,  HEMATOLOGY: No anemia, easy bruising or bleeding SKIN: No rash or lesion. MUSCULOSKELETAL: No joint pain or arthritis.   NEUROLOGIC: No tingling, numbness, weakness.  patient denies any headache  PSYCHIATRY: No anxiety or depression.   MEDICATIONS AT HOME:   Prior to Admission medications  Medication Sig Start Date End Date Taking? Authorizing Provider  aspirin EC 81 MG tablet Take 81 mg by mouth daily.    [provider]  atorvastatin (LIPITOR) 40 MG tablet TAKE 1 TABLET BY MOUTH DAILY AT 6PM 07/01/17   [place], Peter M, MD  Flaxseed, Linseed,  (FLAX SEED OIL PO) Take 1 tablet by mouth daily.    [provider]  furosemide (LASIX) 20 MG tablet TAKE 1 TABLET BY MOUTH EVERY DAY 05/15/17   [place], Peter M, MD  L-Arginine 1000 MG TABS Take 1 tablet by mouth 3 (three) times daily.    [provider]  levothyroxine (SYNTHROID, LEVOTHROID) 100 MCG tablet TAKE 1 TABLET EVERY DAY ON EMPTY STOMACHWITH A GLASS OF WATER AT LEAST 30-60 MINBEFORE BREAKFAST 02/09/17   [provider]  lisinopril (PRINIVIL,ZESTRIL) 5 MG tablet TAKE 1 TABLET BY MOUTH DAILY 10/08/16   [place], Peter M, MD  magnesium oxide (MAG-OX) 400 MG tablet Take 400 mg by mouth 2 (two) times daily.    [provider]  metFORMIN (GLUCOPHAGE) 500 MG tablet Take 500 mg by mouth 2 (two) times daily with a meal.    [provider]  metoprolol succinate (TOPROL-XL) 25 MG 24 hr tablet TAKE 1 TABLET BY MOUTH EVERY DAY 03/15/17   [place], Peter M, MD  pantoprazole (PROTONIX) 40 MG tablet Take 40 mg by mouth daily.    [provider]  QUEtiapine (SEROQUEL) 25 MG tablet Take 1 tablet by mouth at bedtime. 12/17/16   [provider]      VITAL SIGNS:  Blood pressure (!) 166/58, pulse 87, temperature 98.7 F (37.1 C), temperature source Oral, resp. rate 15, height 5\' 2"  (1.575 m), weight 68.9 kg (152 lb), SpO2 96 %.  PHYSICAL EXAMINATION:  GENERAL:  83 y.o.-year-old patient lying in the bed with no acute distress.  EYES: Pupils equal, round, reactive to light and accommodation. No scleral icterus. Extraocular muscles intact.  HEENT: Head atraumatic, normocephalic. Oropharynx and nasopharynx clear.  NECK:  Supple, no jugular venous distention. No thyroid enlargement, no tenderness.  LUNGS: Normal breath sounds bilaterally, no wheezing, rales,rhonchi or crepitation. No use of accessory muscles of respiration.  CARDIOVASCULAR: S1, S2 normal. No murmurs, rubs, or gallops.  ABDOMEN: Soft, nontender, nondistended. Bowel sounds present. No  organomegaly or mass.  EXTREMITIES: No pedal edema, cyanosis, or clubbing.  NEUROLOGIC: Cranial nerves II through XII are intact. Muscle strength 5/5 in all extremities. Sensation intact. Gait not checked.  PSYCHIATRIC: The patient is alert and oriented x 3.  SKIN: No obvious rash, lesion, or ulcer.   LABORATORY PANEL:   CBC Recent Labs  Lab 09/08/17 1504  WBC 11.1*  HGB 10.7*  HCT 34.1*  PLT 215   ------------------------------------------------------------------------------------------------------------------  Chemistries  Recent Labs  Lab 09/08/17 1417  NA 137  K 3.8  CL 103  CO2 23  GLUCOSE 186*  BUN 29*  CREATININE 1.18*  CALCIUM 9.4  AST 27  ALT 15  ALKPHOS 68  BILITOT 0.4   ------------------------------------------------------------------------------------------------------------------  Cardiac Enzymes Recent Labs  Lab 09/08/17 1417  TROPONINI <0.03   ------------------------------------------------------------------------------------------------------------------  RADIOLOGY:  Ct Head Wo Contrast  Result Date: 09/08/2017 CLINICAL DATA:  Near-syncope and vomiting today while eating. EXAM: CT HEAD WITHOUT CONTRAST TECHNIQUE: Contiguous axial images were obtained from the base of the skull through the vertex without intravenous contrast. COMPARISON:  Head CT and brain MRI 01/10/2010. FINDINGS: Brain: There is some cortical atrophy. No evidence of acute abnormality including hemorrhage, infarct,  mass lesion, mass effect, midline shift or abnormal extra-axial fluid collection. No hydrocephalus or pneumocephalus. Vascular: Atherosclerosis noted. Skull: Intact Sinuses/Orbits: Negative. Other: None. IMPRESSION: No acute abnormality. Cortical atrophy. Atherosclerosis. Electronically Signed   By: Drusilla Kanner M.D.   On: 09/08/2017 14:46   Ct Abdomen Pelvis W Contrast  Result Date: 09/08/2017 CLINICAL DATA:  Pt was at [employer] and was fine before  going to meet family. EMS reports that she laid her head on the table and told them she felt like she was going to pass out. She than began to vomit. She keeps saying "I think there is something wrong with my head EXAM: CT ABDOMEN AND PELVIS WITH CONTRAST TECHNIQUE: Multidetector CT imaging of the abdomen and pelvis was performed using the standard protocol following bolus administration of intravenous contrast. CONTRAST:  75mL ISOVUE-300 IOPAMIDOL (ISOVUE-300) INJECTION 61% COMPARISON:  None. FINDINGS: Lower chest: No acute findings. Calcified granuloma at the left lung base. Dense coronary artery calcifications. Hepatobiliary: Scattered calcifications consistent healed granuloma. Liver normal in size and overall attenuation. No mass. Normal gallbladder. No bile duct dilation. Pancreas: Choose 1 Spleen: Multiple splenic calcifications consistent with healed granuloma. Spleen normal in size. No mass or focal lesion. Adrenals/Urinary Tract: No adrenal masses. Bilateral renal cortical thinning. No renal masses, stones or hydronephrosis. Normal ureters. Bladder is unremarkable. Stomach/Bowel: Stomach and small bowel unremarkable. There are numerous sigmoid diverticula. No diverticulitis. Colon otherwise unremarkable. Normal appendix visualized. Vascular/Lymphatic: No significant aortic atherosclerotic disease. Marked atherosclerotic calcifications are noted at the origin of the superior mesenteric artery, which appears severely narrowed. There is focal dilation of the infrarenal abdominal aorta to 2.9 cm. No adenopathy. Reproductive: Uterus and bilateral adnexa are unremarkable. Other: No abdominal wall hernia or abnormality. No abdominopelvic ascites. Musculoskeletal: No fracture or acute finding. No osteoblastic or osteolytic lesions. Bones are demineralized. There are significant degenerative changes throughout the visualized spine. IMPRESSION: 1. No acute findings within the abdomen or pelvis. 2. 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 3. Infrarenal abdominal aortic aneurysm measuring 2.9 cm. Ectatic abdominal aorta at risk for aneurysm development. Recommend followup by ultrasound in 5 years. This recommendation follows ACR consensus guidelines: White Paper of the ACR Incidental Findings Committee II on Vascular Findings. J Am Coll Radiol 2013; 10:789-794. 4. Numerous sigmoid colon diverticula.  No diverticulitis. Electronically Signed   By: Amie Portland M.D.   On: 09/08/2017 15:04   Dg Chest Portable 1 View  Result Date: 09/08/2017 CLINICAL DATA:  Intractable vomiting, sudden onset EXAM: PORTABLE CHEST 1 VIEW COMPARISON:  01/23/2017 FINDINGS: There is a calcified left lung pulmonary nodule consistent with prior granulomas disease. There is no focal parenchymal opacity. There is no pleural effusion or pneumothorax. The heart and mediastinal contours are unremarkable. There is thoracic aortic atherosclerosis. Severe osteoarthritis of bilateral glenohumeral joints. IMPRESSION: No active disease. Electronically Signed   By: Elige Ko   On: 09/08/2017 14:21    EKG:   Orders placed or performed during the hospital encounter of 09/08/17  . ED EKG  . ED EKG    IMPRESSION AND PLAN:   See Beharry  is a 83 y.o. female with a known history of acute on chronic systolic congestive heart failure, hypertension, cardiomyopathy, coronary artery disease, diabetes mellitus and hypertension and other medical problems is brought into the ED for intractable nausea and vomiting associated with high blood pressure today afternoon. Patient went with her family members to  eat lunch outside and she started feeling nauseous and started vomiting after she ate salad.  #Malignant hypertension secondary to retching with nausea and vomiting Admitted to MedSurg unit under observation status Blood pressure improved after giving IV labetalol CT  head is negative Resume home medication lisinopril, metoprolol and titrate as needed IV Lopressor as needed for elevated blood pressure  #Intractable nausea and vomiting-could be viral etiology Patient denies any abdominal pain Mild leukocytosis is present but no fever Supportive treatment with antiemetics and IV fluids PPI Clear liquid diet and advance as tolerated CT abdomen and pelvis with no acute findings  #Diabetes mellitus Hold metformin as patient had CT abdomen and pelvis with contrast Insulin sliding scale   #Chronic systolic  CHFwith cardio myopathy  not fluid overloaded Continue home medication aspirin, statin, metoprolol and lisinopril  Hold Lasix tonight  #Hypertension continue Synthroid  GI prophylaxis with Protonix DVT prophylaxis with Lovenox subcutaneous      All the records are reviewed and case discussed with ED provider. Management plans discussed with the patient, family and they are in agreement.  CODE STATUS: fc , Dr. Gunnar Fusi is the healthcare POA  TOTAL TIME TAKING CARE OF THIS PATIENT: 43 minutes.   Note: This dictation was prepared with Dragon dictation along with smaller phrase technology. Any transcriptional errors that result from this process are unintentional.  Ramonita Lab M.D on 09/08/2017 at 5:26 PM  Between 7am to 6pm - Pager - [phone]  After 6pm go to [email or web address] - password [email or web address] [facility]  [place] [facility]  Office  [phone]  CC: Primary care physician; Marguarite Arbour, MD

## 2017-09-08 NOTE — Progress Notes (Addendum)
Anticoagulation monitoring(Lovenox):  83 yo female ordered Lovenox 40 mg Q24h  09/09/17 08:02 SCr improved to 0.97 mg/dL, CrCl > 30 mL/min. Modify back to LMWH 40 mg subcutaneously once daily. -NAC  Filed Weights   09/08/17 1406  Weight: 152 lb (68.9 kg)   BMI    Lab Results  Component Value Date   CREATININE 1.18 (H) 09/08/2017   CREATININE 1.19 (H) 01/26/2017   CREATININE 1.08 (H) 01/23/2017   Estimated Creatinine Clearance: 24.8 mL/min (A) (by C-G formula based on SCr of 1.18 mg/dL (H)). Hemoglobin & Hematocrit     Component Value Date/Time   HGB 10.7 (L) 09/08/2017 1504   HCT 34.1 (L) 09/08/2017 1504     Per Protocol for Patient with estCrcl < 30 ml/min and BMI < 40, will transition to Lovenox 30 mg Q24h.

## 2017-09-08 NOTE — ED Notes (Signed)
Admitting Provider at bedside. 

## 2017-09-08 NOTE — ED Notes (Signed)
Patient transported to CT 

## 2017-09-08 NOTE — Progress Notes (Signed)
Admission questionnaire done. Patient  denied any acute pain. No N/V so far since admitted to her room. Skin assessment done with Jamesetta So RN, noted bilateral upper extremities bruises. Son at bedside voiced no concerns. Will continue to monitor.

## 2017-09-08 NOTE — ED Provider Notes (Signed)
[facility] Emergency Department Provider Note   ____________________________________________   First MD Initiated Contact with Patient 09/08/17 1351     (approximate)  I have reviewed the triage vital signs and the nursing notes.   HISTORY  Chief Complaint Emesis and Near Syncope  Chief complaint is hypertension and vomiting  HPI Melinda Lewis is a 83 y.o. female per EMS patient recently had her blood pressure medicines changed. She was sitting down at eating lunch said she felt like vomiting per head down on the table and began vomiting. She's been vomiting very frequently since. EMS gave her 4 Zofran which helped a little bit.patient reports her head feels weird she has no pain anywhere has no belly pain or headache but her head feels "weird"   Past Medical History:  Diagnosis Date  . Acute on chronic systolic CHF (congestive heart failure) (HCC) 06/16/2014  . Anemia   . CAD (coronary artery disease) 8/15   s/p DES LCX and LAD   . Cardiomyopathy, ischemic 06/16/2014  . Diabetes mellitus without complication (HCC)   . Hypertension   . NSTEMI (non-ST elevated myocardial infarction) (HCC)   . Thyroid disease   . TIA (transient ischemic attack) 2010    Patient Active Problem List   Diagnosis Date Noted  . Nausea & vomiting 09/08/2017  . Chronic systolic CHF (congestive heart failure) (HCC) 03/04/2015  . Acute on chronic systolic CHF (congestive heart failure) (HCC) 06/16/2014  . Cardiomyopathy, ischemic 06/16/2014  . CAD, multiple vessel 06/15/2014  . Anemia 05/05/2014  . Hypothyroidism 05/05/2014  . 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/05/2014  . NSTEMI (non-ST elevated myocardial infarction) (HCC) 05/02/2014  . Hypertension 05/02/2014  . Pulmonary edema cardiac cause (HCC) 05/02/2014    Past Surgical History:  Procedure Laterality Date  . LEFT HEART CATHETERIZATION WITH CORONARY ANGIOGRAM N/A 05/03/2014   Procedure: LEFT HEART  CATHETERIZATION WITH CORONARY ANGIOGRAM;  Surgeon: Peter M SwazilandJordan, MD;  [facility];  Service: Cardiovascular;  Laterality: N/A;  . PERCUTANEOUS CORONARY STENT INTERVENTION (PCI-S) Right 05/03/2014   Procedure: PERCUTANEOUS CORONARY STENT INTERVENTION (PCI-S);  Surgeon: Peter M SwazilandJordan, MD;  [facility];  Service: Cardiovascular;  Laterality: Right;  . PERCUTANEOUS CORONARY STENT INTERVENTION (PCI-S) N/A 05/04/2014   Procedure: PERCUTANEOUS CORONARY STENT INTERVENTION (PCI-S);  Surgeon: Peter M SwazilandJordan, MD;  [facility];  Service: Cardiovascular;  Laterality: N/A;    Prior to Admission medications   Medication Sig Start Date End Date Taking? Authorizing Provider  aspirin EC 81 MG tablet Take 81 mg by mouth daily.    [provider]  atorvastatin (LIPITOR) 40 MG tablet TAKE 1 TABLET BY MOUTH DAILY AT 6PM 07/01/17   SwazilandJordan, Peter M, MD  Flaxseed, Linseed, (FLAX SEED OIL PO) Take 1 tablet by mouth daily.    [provider]  furosemide (LASIX) 20 MG tablet TAKE 1 TABLET BY MOUTH EVERY DAY 05/15/17   SwazilandJordan, Peter M, MD  L-Arginine 1000 MG TABS Take 1 tablet by mouth 3 (three) times daily.    [provider]  levothyroxine (SYNTHROID, LEVOTHROID) 100 MCG tablet TAKE 1 TABLET EVERY DAY ON EMPTY STOMACHWITH A GLASS OF WATER AT LEAST 30-60 MINBEFORE BREAKFAST 02/09/17   [provider]  lisinopril (PRINIVIL,ZESTRIL) 5 MG tablet TAKE 1 TABLET BY MOUTH DAILY 10/08/16   SwazilandJordan, Peter M, MD  magnesium oxide (MAG-OX) 400 MG tablet Take 400 mg by mouth 2 (two) times daily.    [provider]  metFORMIN (GLUCOPHAGE) 500 MG tablet Take 500 mg by mouth 2 (two) times daily with a meal.    [provider]  metoprolol succinate (TOPROL-XL) 25 MG 24 hr tablet TAKE 1 TABLET BY MOUTH EVERY DAY 03/15/17   [place], Peter M, MD  pantoprazole (PROTONIX) 40 MG tablet Take 40 mg by mouth daily.    [provider]  QUEtiapine (SEROQUEL) 25 MG  tablet Take 1 tablet by mouth at bedtime. 12/17/16   [provider]    Allergies Cefuroxime axetil; Clonidine derivatives; Epinephrine; Penicillins; and Septra [sulfamethoxazole-trimethoprim]  History reviewed. No pertinent family history.  Social History Social History   Tobacco Use  . Smoking status: Former Smoker    Types: Cigarettes  . Smokeless tobacco: Never Used  Substance Use Topics  . Alcohol use: No  . Drug use: No    Review of Systems  Constitutional: No fever/chills Eyes: No visual changes. ENT: No sore throat. Cardiovascular: Denies chest pain. Respiratory: Denies shortness of breath. Gastrointestinal: No abdominal pain.   nausea,  vomiting.  No diarrhea.  No constipation. Genitourinary: Negative for dysuria. Musculoskeletal: Negative for back pain. Skin: Negative for rash. Neurological: Negative for headaches, focal weakness or numbness.  ____________________________________________   PHYSICAL EXAM:  VITAL SIGNS: ED Triage Vitals  Enc Vitals Group     BP      Pulse      Resp      Temp      Temp src      SpO2      Weight      Height      Head Circumference      Peak Flow      Pain Score      Pain Loc      Pain Edu?      Excl. in GC?     Constitutional: Alert and oriented. vomiting repeatedly Eyes: Conjunctivae are normal. PERRL. EOMI.fundi look normal Head: Atraumatic. Nose: No congestion/rhinnorhea. Mouth/Throat: Mucous membranes are moist.  Oropharynx non-erythematous. Neck: No stridor.  Cardiovascular: Normal rate, regular rhythm. Grossly normal heart sounds.  Good peripheral circulation. Respiratory: Normal respiratory effort.  No retractions. Lungs CTAB. Gastrointestinal: Soft and nontender. No distention. No abdominal bruits. Musculoskeletal: No lower extremity tenderness nor edema.  No joint effusions. Neurologic:  Normal speech and language. No gross focal neurologic deficits are appreciated.  Skin:  Skin is warm, dry  and intact. No rash noted. Psychiatric: Mood and affect are normal. Speech and behavior are normal.  ____________________________________________   LABS (all labs ordered are listed, but only abnormal results are displayed)  Labs Reviewed  COMPREHENSIVE METABOLIC PANEL - Abnormal; Notable for the following components:      Result Value   Glucose, Bld 186 (*)    BUN 29 (*)    Creatinine, Ser 1.18 (*)    GFR calc non Af Amer 37 (*)    GFR calc Af Amer 43 (*)    All other components within normal limits  CBC WITH DIFFERENTIAL/PLATELET - Abnormal; Notable for the following components:   WBC 11.1 (*)    Hemoglobin 10.7 (*)    HCT 34.1 (*)    MCHC 31.3 (*)    RDW 16.0 (*)    Neutro Abs 8.7 (*)    All other components within normal limits  LIPASE, BLOOD  TROPONIN I  PROTIME-INR  CBC WITH DIFFERENTIAL/PLATELET  URINALYSIS, COMPLETE (UACMP) WITH MICROSCOPIC  CBG MONITORING, ED   ____________________________________________  EKG EKG read and  interpreted by me shows normal sinus rhythm rate of 87 normal axis no acute ST-T wave changes baseline is very irregular which interferes with my ability to read the EKG efficiently. There is decreased R wave progression.  ____________________________________________  RADIOLOGY  __CT of the head read as normal__________________________________________   PROCEDURES  Procedure(s) performed:   Procedures  Critical Care performed:   ____________________________________________   INITIAL IMPRESSION / ASSESSMENT AND PLAN / ED COURSE   Clinical Course as of Sep 08 1699  Sun Sep 08, 2017  1638 Hemoglobin: (!) 10.7 [PM]    Clinical Course User Index [PM] Arnaldo Natal, MD   patient's heart rate is 92 blood pressure 233/84 I will give her the bagel all 10 IV to start and CT her head quickly.  ----------------------------------------- 5:00 PM on 09/08/2017 -----------------------------------------  Patient's blood pressures come  down to 170 systolic she is feeling better but still not herself she still feels fuzzy and woozy and a little bit nauseated. CT does not show anything. I am worried however about possibly something like mild press. We will observe the patient overnight and see how she does. ____________________________________________   FINAL CLINICAL IMPRESSION(S) / ED DIAGNOSES  Final diagnoses:  Severe hypertension  Altered mental status, unspecified altered mental status type     ED Discharge Orders    None       Note:  This document was prepared using Dragon voice recognition software and may include unintentional dictation errors.    Arnaldo Natal, MD 09/08/17 1700

## 2017-09-08 NOTE — ED Notes (Signed)
Unable to give report at this time. Will call back.  

## 2017-09-08 NOTE — ED Triage Notes (Signed)
Pt was at [employer] and was fine before going to meet family. EMS reports that she laid her head on the table and told them she felt like she was going to pass out. She than began to vomit. She keeps saying "I think there is something wrong with my head.

## 2017-09-09 ENCOUNTER — Observation Stay (HOSPITAL_BASED_OUTPATIENT_CLINIC_OR_DEPARTMENT_OTHER)
Admission: EM | Admit: 2017-09-09 | Discharge: 2017-09-12 | Disposition: A | Discharge status: Home / Self Care | Payer: Medicare Other | Source: Home / Self Care | Attending: Emergency Medicine | Admitting: Emergency Medicine

## 2017-09-09 ENCOUNTER — Observation Stay: Payer: Medicare Other

## 2017-09-09 ENCOUNTER — Encounter: Payer: Self-pay | Admitting: Emergency Medicine

## 2017-09-09 DIAGNOSIS — I10 Essential (primary) hypertension: Secondary | ICD-10-CM | POA: Diagnosis present

## 2017-09-09 LAB — CBC
HCT: 28.6 % — ABNORMAL LOW (ref 35.0–47.0)
Hemoglobin: 9.2 g/dL — ABNORMAL LOW (ref 12.0–16.0)
MCH: 26.6 pg (ref 26.0–34.0)
MCHC: 32.2 g/dL (ref 32.0–36.0)
MCV: 82.8 fL (ref 80.0–100.0)
PLATELETS: 220 10*3/uL (ref 150–440)
RBC: 3.46 MIL/uL — ABNORMAL LOW (ref 3.80–5.20)
RDW: 15.9 % — AB (ref 11.5–14.5)
WBC: 9.9 10*3/uL (ref 3.6–11.0)

## 2017-09-09 LAB — TSH: TSH: 4.541 u[IU]/mL — AB (ref 0.350–4.500)

## 2017-09-09 LAB — BASIC METABOLIC PANEL
ANION GAP: 5 (ref 5–15)
BUN: 21 mg/dL — ABNORMAL HIGH (ref 6–20)
CALCIUM: 8.4 mg/dL — AB (ref 8.9–10.3)
CO2: 26 mmol/L (ref 22–32)
CREATININE: 0.97 mg/dL (ref 0.44–1.00)
Chloride: 104 mmol/L (ref 101–111)
GFR, EST AFRICAN AMERICAN: 55 mL/min — AB (ref >60)
GFR, EST NON AFRICAN AMERICAN: 47 mL/min — AB (ref >60)
Glucose, Bld: 106 mg/dL — ABNORMAL HIGH (ref 65–99)
Potassium: 3.9 mmol/L (ref 3.5–5.1)
SODIUM: 135 mmol/L (ref 135–145)

## 2017-09-09 LAB — GLUCOSE, CAPILLARY
GLUCOSE-CAPILLARY: 105 mg/dL — AB (ref 65–99)
Glucose-Capillary: 105 mg/dL — ABNORMAL HIGH (ref 65–99)
Glucose-Capillary: 143 mg/dL — ABNORMAL HIGH (ref 65–99)

## 2017-09-09 MED ORDER — METOPROLOL SUCCINATE ER 25 MG PO TB24
25.0000 mg | ORAL_TABLET | Freq: Every day | ORAL | Status: DC
  Administered 2017-09-09: 09:00:00 25 mg via ORAL
  Filled 2017-09-09: qty 1

## 2017-09-09 MED ORDER — METOPROLOL SUCCINATE ER 25 MG PO TB24: 25.0000 mg | ORAL_TABLET | Freq: Two times a day (BID) | ORAL | Status: DC

## 2017-09-09 MED ORDER — LISINOPRIL 20 MG PO TABS
20.0000 mg | ORAL_TABLET | Freq: Every day | ORAL | Status: DC
  Administered 2017-09-10 – 2017-09-11 (×2): 20 mg via ORAL
  Filled 2017-09-09 (×2): qty 1

## 2017-09-09 MED ORDER — DOCUSATE SODIUM 100 MG PO CAPS
100.0000 mg | ORAL_CAPSULE | Freq: Two times a day (BID) | ORAL | Status: DC
  Administered 2017-09-10 – 2017-09-12 (×5): 100 mg via ORAL
  Filled 2017-09-09 (×5): qty 1

## 2017-09-09 MED ORDER — ACETAMINOPHEN 325 MG PO TABS: 650.0000 mg | ORAL_TABLET | Freq: Four times a day (QID) | ORAL | Status: DC | PRN

## 2017-09-09 MED ORDER — ONDANSETRON HCL 4 MG PO TABS: 4.0000 mg | ORAL_TABLET | Freq: Four times a day (QID) | ORAL | Status: DC | PRN

## 2017-09-09 MED ORDER — ASPIRIN EC 81 MG PO TBEC
81.0000 mg | DELAYED_RELEASE_TABLET | Freq: Every day | ORAL | Status: DC
  Administered 2017-09-10 – 2017-09-12 (×3): 81 mg via ORAL
  Filled 2017-09-09 (×3): qty 1

## 2017-09-09 MED ORDER — ENOXAPARIN SODIUM 40 MG/0.4ML ~~LOC~~ SOLN: 40.0000 mg | SUBCUTANEOUS | Status: DC

## 2017-09-09 MED ORDER — METOPROLOL TARTRATE 25 MG PO TABS
25.0000 mg | ORAL_TABLET | Freq: Once | ORAL | Status: AC
  Administered 2017-09-09: 25 mg via ORAL
  Filled 2017-09-09: qty 1

## 2017-09-09 MED ORDER — GADOBENATE DIMEGLUMINE 529 MG/ML IV SOLN
15.0000 mL | Freq: Once | INTRAVENOUS | Status: AC | PRN
  Administered 2017-09-09: 14 mL via INTRAVENOUS

## 2017-09-09 MED ORDER — PANTOPRAZOLE SODIUM 40 MG PO TBEC
40.0000 mg | DELAYED_RELEASE_TABLET | Freq: Every day | ORAL | Status: DC
  Administered 2017-09-10 – 2017-09-12 (×3): 40 mg via ORAL
  Filled 2017-09-09 (×3): qty 1

## 2017-09-09 MED ORDER — HYDROCODONE-ACETAMINOPHEN 5-325 MG PO TABS: 1.0000 | ORAL_TABLET | ORAL | Status: DC | PRN

## 2017-09-09 MED ORDER — LISINOPRIL 10 MG PO TABS
10.0000 mg | ORAL_TABLET | Freq: Once | ORAL | Status: AC
  Administered 2017-09-09: 10 mg via ORAL
  Filled 2017-09-09: qty 1

## 2017-09-09 MED ORDER — LABETALOL HCL 5 MG/ML IV SOLN: 5.0000 mg | Freq: Once | INTRAVENOUS | Status: DC

## 2017-09-09 MED ORDER — L-ARGININE 1000 MG PO TABS: 1.0000 | ORAL_TABLET | Freq: Three times a day (TID) | ORAL | Status: DC

## 2017-09-09 MED ORDER — QUETIAPINE FUMARATE 25 MG PO TABS
25.0000 mg | ORAL_TABLET | Freq: Every day | ORAL | Status: DC
  Administered 2017-09-10 – 2017-09-11 (×3): 25 mg via ORAL
  Filled 2017-09-09 (×3): qty 1

## 2017-09-09 MED ORDER — FUROSEMIDE 20 MG PO TABS
20.0000 mg | ORAL_TABLET | Freq: Every day | ORAL | Status: DC
  Administered 2017-09-10 – 2017-09-12 (×3): 20 mg via ORAL
  Filled 2017-09-09 (×3): qty 1

## 2017-09-09 MED ORDER — ONDANSETRON HCL 4 MG/2ML IJ SOLN
4.0000 mg | Freq: Four times a day (QID) | INTRAMUSCULAR | Status: DC | PRN
  Administered 2017-09-10: 4 mg via INTRAVENOUS
  Filled 2017-09-09: qty 2

## 2017-09-09 MED ORDER — METOPROLOL SUCCINATE ER 25 MG PO TB24
25.0000 mg | ORAL_TABLET | Freq: Every day | ORAL | Status: DC
  Administered 2017-09-10 – 2017-09-12 (×3): 25 mg via ORAL
  Filled 2017-09-09 (×3): qty 1

## 2017-09-09 MED ORDER — ACETAMINOPHEN 650 MG RE SUPP: 650.0000 mg | Freq: Four times a day (QID) | RECTAL | Status: DC | PRN

## 2017-09-09 MED ORDER — HEPARIN SODIUM (PORCINE) 5000 UNIT/ML IJ SOLN
5000.0000 [IU] | Freq: Three times a day (TID) | INTRAMUSCULAR | Status: DC
  Administered 2017-09-10 – 2017-09-12 (×8): 5000 [IU] via SUBCUTANEOUS
  Filled 2017-09-09 (×7): qty 1

## 2017-09-09 MED ORDER — LEVOTHYROXINE SODIUM 100 MCG PO TABS
100.0000 ug | ORAL_TABLET | Freq: Every day | ORAL | Status: DC
  Administered 2017-09-10 – 2017-09-12 (×3): 100 ug via ORAL
  Filled 2017-09-09 (×3): qty 1

## 2017-09-09 MED ORDER — LISINOPRIL 10 MG PO TABS
10.0000 mg | ORAL_TABLET | Freq: Every day | ORAL | Status: DC
  Administered 2017-09-09: 09:00:00 10 mg via ORAL
  Filled 2017-09-09: qty 1

## 2017-09-09 MED ORDER — METOPROLOL TARTRATE 5 MG/5ML IV SOLN
5.0000 mg | INTRAVENOUS | Status: DC | PRN
  Administered 2017-09-11 (×2): 5 mg via INTRAVENOUS
  Filled 2017-09-09 (×2): qty 5

## 2017-09-09 MED ORDER — BISACODYL 5 MG PO TBEC: 5.0000 mg | DELAYED_RELEASE_TABLET | Freq: Every day | ORAL | Status: DC | PRN

## 2017-09-09 MED ORDER — MAGNESIUM OXIDE 400 (241.3 MG) MG PO TABS
400.0000 mg | ORAL_TABLET | Freq: Two times a day (BID) | ORAL | Status: DC
Start: 2017-09-10 — End: 2017-09-12
  Administered 2017-09-10 – 2017-09-12 (×6): 400 mg via ORAL
  Filled 2017-09-09 (×6): qty 1

## 2017-09-09 MED ORDER — TRAZODONE HCL 50 MG PO TABS: 25.0000 mg | ORAL_TABLET | Freq: Every evening | ORAL | Status: DC | PRN

## 2017-09-09 MED ORDER — LISINOPRIL 10 MG PO TABS: 10.0000 mg | ORAL_TABLET | Freq: Every day | ORAL | 0 refills | Status: DC

## 2017-09-09 MED ORDER — METFORMIN HCL 500 MG PO TABS
500.0000 mg | ORAL_TABLET | Freq: Two times a day (BID) | ORAL | Status: DC
  Administered 2017-09-10 – 2017-09-12 (×5): 500 mg via ORAL
  Filled 2017-09-09 (×5): qty 1

## 2017-09-09 MED ORDER — SODIUM CHLORIDE 0.9 % IV SOLN
INTRAVENOUS | Status: DC
  Administered 2017-09-10: via INTRAVENOUS

## 2017-09-09 NOTE — Care Management Obs Status (Signed)
MEDICARE OBSERVATION STATUS NOTIFICATION   Patient Details  Name: Melinda Lewis MRN: [id number] Date of Birth: 1918-08-17   Medicare Observation Status Notification Given:  Yes    Gwenette Greet, RN 09/09/2017, 10:04 AM

## 2017-09-09 NOTE — Progress Notes (Signed)
Discharge instructions along with home medications and follow up gone over with patient and daughter. Both verbalize that they understood instructions. 1 prescription given to patient. IV and tele removed. Pt being discharged home on room air, no distress noted. Otilio Jefferson, RN

## 2017-09-09 NOTE — Progress Notes (Signed)
Patient woke up complaining of nausea and dizziness. BP was checked and noted to be 194/68. Patient received Metoprolol IV and Zofran IV PRN as noted in the [facility]. BP=155/52 after the IV Metoprolol. Patient verbalized relief and she is resting comfortably at this time, espirations even and unlabored. Will continue to monitor.

## 2017-09-09 NOTE — H&P (Signed)
[facility] [place] [facility]   PATIENT NAME: Melinda Lewis    MR#:  [id number]  DATE OF BIRTH:  1918/06/20  DATE OF ADMISSION:  09/09/2017  PRIMARY CARE PHYSICIAN: Marguarite Arbour, MD   REQUESTING/REFERRING PHYSICIAN: Jeanmarie Plant, MD  CHIEF COMPLAINT:   Chief Complaint  Patient presents with  . Hypertension    HISTORY OF PRESENT ILLNESS:  Melinda Lewis  is a 83 y.o. female with a known history of chronic Diastolic congestive heart failure, hypertension, cardiomyopathy, coronary artery disease, diabetes mellitus and hypertension and other medical problems is brought into the ED just discharged earlier today.  She states that she has had episodes where her arms go numb before her prior admission but not since she went home.  She has had negative CT scans and MRI of the head on yesterday's admission.  She is essentially asymptomatic with her elevated blood pressure.  Her family is concerned that her blood pressure fluctuating at home and would like her to get admitted as they do not know what else to do at home. PAST MEDICAL HISTORY:   Past Medical History:  Diagnosis Date  . Acute on chronic systolic CHF (congestive heart failure) (HCC) 06/16/2014  . Anemia   . CAD (coronary artery disease) 8/15   s/p DES LCX and LAD   . Cardiomyopathy, ischemic 06/16/2014  . Diabetes mellitus without complication (HCC)   . Hypertension   . NSTEMI (non-ST elevated myocardial infarction) (HCC)   . Thyroid disease   . TIA (transient ischemic attack) 2010    PAST SURGICAL HISTORY:   Past Surgical History:  Procedure Laterality Date  . LEFT HEART CATHETERIZATION WITH CORONARY ANGIOGRAM N/A 05/03/2014   Procedure: LEFT HEART CATHETERIZATION WITH CORONARY ANGIOGRAM;  Surgeon: Peter M Swaziland, MD;  Location: [facility] CATH LAB;  Service: Cardiovascular;  Laterality: N/A;  . PERCUTANEOUS CORONARY STENT INTERVENTION (PCI-S) Right 05/03/2014   Procedure: PERCUTANEOUS CORONARY  STENT INTERVENTION (PCI-S);  Surgeon: Peter M Swaziland, MD;  Location: [facility] CATH LAB;  Service: Cardiovascular;  Laterality: Right;  . PERCUTANEOUS CORONARY STENT INTERVENTION (PCI-S) N/A 05/04/2014   Procedure: PERCUTANEOUS CORONARY STENT INTERVENTION (PCI-S);  Surgeon: Peter M Swaziland, MD;  Location: [facility] CATH LAB;  Service: Cardiovascular;  Laterality: N/A;    SOCIAL HISTORY:   Social History   Tobacco Use  . Smoking status: Former Smoker    Types: Cigarettes  . Smokeless tobacco: Never Used  Substance Use Topics  . Alcohol use: No    FAMILY HISTORY:  No family history on file. Myocardial Infarction (Heart attack) Mother  DRUG ALLERGIES:   Allergies  Allergen Reactions  . Cefuroxime Axetil Nausea Only    cramps  . Clonidine Derivatives Other (See Comments)    Maker her pass out  . Epinephrine   . Penicillins   . Septra [Sulfamethoxazole-Trimethoprim] Nausea And Vomiting    REVIEW OF SYSTEMS:   Review of Systems  Constitutional: Negative for chills, fever and weight loss.  HENT: Negative for nosebleeds and sore throat.   Eyes: Negative for blurred vision.  Respiratory: Negative for cough, shortness of breath and wheezing.   Cardiovascular: Negative for chest pain, orthopnea, leg swelling and PND.  Gastrointestinal: Negative for abdominal pain, constipation, diarrhea, heartburn, nausea and vomiting.  Genitourinary: Negative for dysuria and urgency.  Musculoskeletal: Negative for back pain.  Skin: Negative for rash.  Neurological: Positive for dizziness. Negative for speech change, focal weakness and headaches.  Endo/Heme/Allergies: Does not bruise/bleed easily.  Psychiatric/Behavioral:  Negative for depression.   MEDICATIONS AT HOME:   Prior to Admission medications   Medication Sig Start Date End Date Taking? Authorizing Provider  aspirin EC 81 MG tablet Take 81 mg by mouth daily.   Yes [provider]  atorvastatin (LIPITOR) 40 MG tablet TAKE 1 TABLET BY MOUTH  DAILY AT 6PM 07/01/17  Yes Swaziland, Peter M, MD  Flaxseed, Linseed, (FLAX SEED OIL PO) Take 1 tablet by mouth daily.   Yes [provider]  furosemide (LASIX) 20 MG tablet TAKE 1 TABLET BY MOUTH EVERY DAY 05/15/17  Yes Swaziland, Peter M, MD  L-Arginine 1000 MG TABS Take 1 tablet by mouth 3 (three) times daily.   Yes [provider]  levothyroxine (SYNTHROID, LEVOTHROID) 100 MCG tablet TAKE 1 TABLET EVERY DAY ON EMPTY STOMACHWITH A GLASS OF WATER AT LEAST 30-60 MINBEFORE BREAKFAST 02/09/17  Yes [provider]  lisinopril (PRINIVIL,ZESTRIL) 10 MG tablet Take 1 tablet (10 mg total) by mouth daily. 09/10/17  Yes Shaune Pollack, MD  magnesium oxide (MAG-OX) 400 MG tablet Take 400 mg by mouth 2 (two) times daily.   Yes [provider]  metFORMIN (GLUCOPHAGE) 500 MG tablet Take 500 mg by mouth 2 (two) times daily with a meal.   Yes [provider]  metoprolol succinate (TOPROL-XL) 25 MG 24 hr tablet TAKE 1 TABLET BY MOUTH EVERY DAY 03/15/17  Yes Swaziland, Peter M, MD  pantoprazole (PROTONIX) 40 MG tablet Take 40 mg by mouth daily.   Yes [provider]  QUEtiapine (SEROQUEL) 25 MG tablet Take 1 tablet by mouth at bedtime. 12/17/16  Yes [provider]      VITAL SIGNS:  Blood pressure (!) 188/63, pulse 66, temperature 97.6 F (36.4 C), temperature source Oral, resp. rate 16, SpO2 97 %.  PHYSICAL EXAMINATION:  Physical Exam  GENERAL:  83 y.o.-year-old patient lying in the bed with no acute distress.  EYES: Pupils equal, round, reactive to light and accommodation. No scleral icterus. Extraocular muscles intact.  HEENT: Head atraumatic, normocephalic. Oropharynx and nasopharynx clear.  NECK:  Supple, no jugular venous distention. No thyroid enlargement, no tenderness.  LUNGS: Normal breath sounds bilaterally, no wheezing, rales,rhonchi or crepitation. No use of accessory muscles of respiration.  CARDIOVASCULAR: S1, S2 normal. No murmurs, rubs, or  gallops.  ABDOMEN: Soft, nontender, nondistended. Bowel sounds present. No organomegaly or mass.  EXTREMITIES: No pedal edema, cyanosis, or clubbing.  NEUROLOGIC: Cranial nerves II through XII are intact. Muscle strength 5/5 in all extremities. Sensation intact. Gait not checked.  PSYCHIATRIC: The patient is alert and oriented x 3.  SKIN: No obvious rash, lesion, or ulcer.   LABORATORY PANEL:   CBC Recent Labs  Lab 09/09/17 0549  WBC 9.9  HGB 9.2*  HCT 28.6*  PLT 220   ------------------------------------------------------------------------------------------------------------------  Chemistries  Recent Labs  Lab 09/08/17 1417 09/09/17 0549  NA 137 135  K 3.8 3.9  CL 103 104  CO2 23 26  GLUCOSE 186* 106*  BUN 29* 21*  CREATININE 1.18* 0.97  CALCIUM 9.4 8.4*  AST 27  --   ALT 15  --   ALKPHOS 68  --   BILITOT 0.4  --    ------------------------------------------------------------------------------------------------------------------  Cardiac Enzymes Recent Labs  Lab 09/08/17 1417  TROPONINI <0.03   ------------------------------------------------------------------------------------------------------------------  RADIOLOGY:  Ct Head Wo Contrast  Result Date: 09/08/2017 CLINICAL DATA:  Near-syncope and vomiting today while eating. EXAM: CT HEAD WITHOUT CONTRAST TECHNIQUE: Contiguous axial images were obtained from the  base of the skull through the vertex without intravenous contrast. COMPARISON:  Head CT and brain MRI 01/10/2010. FINDINGS: Brain: There is some cortical atrophy. No evidence of acute abnormality including hemorrhage, infarct, mass lesion, mass effect, midline shift or abnormal extra-axial fluid collection. No hydrocephalus or pneumocephalus. Vascular: Atherosclerosis noted. Skull: Intact Sinuses/Orbits: Negative. Other: None. IMPRESSION: No acute abnormality. Cortical atrophy. Atherosclerosis. Electronically Signed   By: Drusilla Kannerhomas  Dalessio M.D.   On:  09/08/2017 14:46   Mr Laqueta JeanBrain W HQWo Contrast  Result Date: 09/09/2017 CLINICAL DATA:  83 year old female with intractable nausea, vomiting and hypertension. Headache. In the emergency department systolic pressure blood pressure is 230. EXAM: MRI HEAD WITHOUT AND WITH CONTRAST TECHNIQUE: Multiplanar, multiecho pulse sequences of the brain and surrounding structures were obtained without and with intravenous contrast. CONTRAST:  14mL MULTIHANCE GADOBENATE DIMEGLUMINE 529 MG/ML IV SOLN COMPARISON:  Head CT without contrast 09/08/2017. Brain MRI 01/10/2010. FINDINGS: Brain: No restricted diffusion to suggest acute infarction. No midline shift, mass effect, evidence of mass lesion, ventriculomegaly, extra-axial collection or acute intracranial hemorrhage. Cervicomedullary junction and pituitary are within normal limits. Cerebral volume is not significantly changed since 2011. Wallace CullensGray and white matter signal also has not significantly changed - with evidence of a chronic lacunar infarct at the left external capsule - but otherwise minimal nonspecific cerebral white matter signal changes. No cortical encephalomalacia or chronic cerebral blood products identified. No abnormal enhancement identified. No dural thickening. Vascular: Major intracranial vascular flow voids are stable since 2011. The distal left vertebral artery appears dominant. The major dural venous sinuses are enhancing and appear to be patent. Skull and upper cervical spine: Visible cervical spine is negative for age. Normal bone marrow signal. Sinuses/Orbits: Stable since 2011. Other: Stable mild left mastoid effusion which appears inconsequential. Grossly normal visible internal auditory structures. Scalp and face soft tissues appear negative. IMPRESSION: No acute intracranial abnormality. Largely stable since 2011 and unremarkable for age MRI appearance of the brain. Electronically Signed   By: Odessa FlemingH  Hall M.D.   On: 09/09/2017 13:37   Ct Abdomen Pelvis W  Contrast  Result Date: 09/08/2017 CLINICAL DATA:  [place] [employer] [place] and was fine before going to meet family. EMS reports that she laid her head on the table and told them she felt like she was going to pass out. She than began to vomit. She keeps saying "I think there is something wrong with my head EXAM: CT ABDOMEN AND PELVIS WITH CONTRAST TECHNIQUE: Multidetector CT imaging of the abdomen and pelvis was performed using the standard protocol following bolus administration of intravenous contrast. CONTRAST:  75mL ISOVUE-300 IOPAMIDOL (ISOVUE-300) INJECTION 61% COMPARISON:  None. FINDINGS: Lower chest: No acute findings. Calcified granuloma at the left lung base. Dense coronary artery calcifications. Hepatobiliary: Scattered calcifications consistent healed granuloma. Liver normal in size and overall attenuation. No mass. Normal gallbladder. No bile duct dilation. Pancreas: Choose 1 Spleen: Multiple splenic calcifications consistent with healed granuloma. Spleen normal in size. No mass or focal lesion. Adrenals/Urinary Tract: No adrenal masses. Bilateral renal cortical thinning. No renal masses, stones or hydronephrosis. Normal ureters. Bladder is unremarkable. Stomach/Bowel: Stomach and small bowel unremarkable. There are numerous sigmoid diverticula. No diverticulitis. Colon otherwise unremarkable. Normal appendix visualized. Vascular/Lymphatic: No significant aortic atherosclerotic disease. Marked atherosclerotic calcifications are noted at the origin of the superior mesenteric artery, which appears severely narrowed. There is focal dilation of the infrarenal abdominal aorta to 2.9 cm. No adenopathy. Reproductive: Uterus and bilateral adnexa are unremarkable. Other: No  abdominal wall hernia or abnormality. No abdominopelvic ascites. Musculoskeletal: No fracture or acute finding. No osteoblastic or osteolytic lesions. Bones are demineralized. There are significant degenerative changes throughout the  visualized spine. IMPRESSION: 1. No acute findings within the abdomen or pelvis. 2. 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 3. Infrarenal abdominal aortic aneurysm measuring 2.9 cm. Ectatic abdominal aorta at risk for aneurysm development. Recommend followup by ultrasound in 5 years. This recommendation follows ACR consensus guidelines: White Paper of the ACR Incidental Findings Committee II on Vascular Findings. J Am Coll Radiol 2013; 10:789-794. 4. Numerous sigmoid colon diverticula.  No diverticulitis. Electronically Signed   By: Amie Portland M.D.   On: 09/08/2017 15:04   Dg Chest Portable 1 View  Result Date: 09/08/2017 CLINICAL DATA:  Intractable vomiting, sudden onset EXAM: PORTABLE CHEST 1 VIEW COMPARISON:  01/23/2017 FINDINGS: There is a calcified left lung pulmonary nodule consistent with prior granulomas disease. There is no focal parenchymal opacity. There is no pleural effusion or pneumothorax. The heart and mediastinal contours are unremarkable. There is thoracic aortic atherosclerosis. Severe osteoarthritis of bilateral glenohumeral joints. IMPRESSION: No active disease. Electronically Signed   By: Elige Ko   On: 09/08/2017 14:21   IMPRESSION AND PLAN:  Melinda Lewis  is a 83 y.o. female with a known history of acute on chronic Diastolic congestive heart failure, hypertension, cardiomyopathy, coronary artery disease, diabetes mellitus and hypertension and other medical problems is brought back to the emergency department after discharge earlier today for uncontrolled blood pressure.  #Uncontrolled hypertension secondary to anxiety? Admitted to tele under observation status Blood pressure improved after giving IV labetalol CT and MRI head is negative - Increase lisinopril, continue metoprolol and titrate as needed IV Lopressor as needed for elevated blood pressure  #Recurrent syncope/presyncope -Had  a negative CT and MRI of the head on previous admission  -We will check orthostatic vitals and TSH -Consult cardiology for evaluation of any arrhythmias -This could be due to vasovagal and or orthostatic changes  #Diabetes mellitus -Continue home medications  #Hypothyroidism -Continue Synthroid and check TSH      All the records are reviewed and case discussed with ED provider. Management plans discussed with the patient, family and they are in agreement.  CODE STATUS: Full code  TOTAL TIME TAKING CARE OF THIS PATIENT: 45 minutes.    Delfino Lovett M.D on 09/09/2017 at 10:02 PM  Between 7am to 6pm - Pager - [phone]  After 6pm go to [email or web address] - Social research officer, government  [facility]  Office  [phone]  CC: Primary care physician; Marguarite Arbour, MD   Note: This dictation was prepared with Dragon dictation along with smaller phrase technology. Any transcriptional errors that result from this process are unintentional.

## 2017-09-09 NOTE — ED Notes (Signed)
Spoke with Dr. Imogene Burn face to face in regards to patient presentation. Dr. Imogene Burn states "if her blood pressure is still high after the oral medication and she needs to be admitted call the nursing supervisor so she can be a direct admit".

## 2017-09-09 NOTE — Discharge Summary (Signed)
[facility]   PATIENT NAME: Melinda Lewis    MR#:  [id number]  DATE OF BIRTH:  1918/07/18  DATE OF ADMISSION:  09/08/2017   ADMITTING PHYSICIAN: Ramonita Lab, MD  DATE OF DISCHARGE:09/09/2017  PRIMARY CARE PHYSICIAN: Marguarite Arbour, MD   ADMISSION DIAGNOSIS:  Severe hypertension [I10] Altered mental status, unspecified altered mental status type [R41.82] DISCHARGE DIAGNOSIS:  Active Problems:   Nausea & vomiting  SECONDARY DIAGNOSIS:   Past Medical History:  Diagnosis Date  . Acute on chronic systolic CHF (congestive heart failure) (HCC) 06/16/2014  . Anemia   . CAD (coronary artery disease) 8/15   s/p DES LCX and LAD   . Cardiomyopathy, ischemic 06/16/2014  . Diabetes mellitus without complication (HCC)   . Hypertension   . NSTEMI (non-ST elevated myocardial infarction) (HCC)   . Thyroid disease   . TIA (transient ischemic attack) 2010   HOSPITAL COURSE:  Melinda Lewis  is a 83 y.o. female with a known history of acute on chronic systolic congestive heart failure, hypertension, cardiomyopathy, coronary artery disease, diabetes mellitus and hypertension and other medical problems is brought into the ED for intractable nausea and vomiting associated with high blood pressure today afternoon. Patient went with her family members to eat lunch outside and she started feeling nauseous and started vomiting after she ate salad.  #Malignant hypertension secondary to retching with nausea and vomiting Blood pressure is better controlled. CT head is negative. MRI brain is negative. Increased lisinopril to 10 mg daily, continue metoprolol. IV Lopressor as needed for elevated blood pressure  #Intractable nausea and vomiting-could be viral etiology Patient denies any abdominal pain Mild leukocytosis is present but no fever Supportive treatment with antiemetics and IV fluids PPI CT abdomen and pelvis: Aneurysm 2.9 centimeters and severe  narrowing SMA. I discussed with Dr. Evie Lacks, who recommended follow-up vascular surgery clinic as outpatient.  #Diabetes mellitus Hold metformin as patient had CT abdomen and pelvis with contrast Insulin sliding scale, resume metformin after discharge.   #Chronic systolic  CHFwith cardio myopathy  not fluid overloaded Continue home medication aspirin, statin, metoprolol and lisinopril  Resume Lasix after discharge.  #Hypertension continue Synthroid  Dehydration improved with IV fluid support.  Discussed with Dr. Evie Lacks.  DISCHARGE CONDITIONS:  Stable, discharge to home today. CONSULTS OBTAINED:  Treatment Team:  Bertram Denver, MD DRUG ALLERGIES:   Allergies  Allergen Reactions  . Cefuroxime Axetil Nausea Only    cramps  . Clonidine Derivatives Other (See Comments)    Maker her pass out  . Epinephrine   . Penicillins   . Septra [Sulfamethoxazole-Trimethoprim] Nausea And Vomiting   DISCHARGE MEDICATIONS:   Allergies as of 09/09/2017      Reactions   Cefuroxime Axetil Nausea Only   cramps   Clonidine Derivatives Other (See Comments)   Maker her pass out   Epinephrine    Penicillins    Septra [sulfamethoxazole-trimethoprim] Nausea And Vomiting      Medication List    TAKE these medications   aspirin EC 81 MG tablet Take 81 mg by mouth daily.   atorvastatin 40 MG tablet Commonly known as:  LIPITOR TAKE 1 TABLET BY MOUTH DAILY AT 6PM   FLAX SEED OIL PO Take 1 tablet by mouth daily.   furosemide 20 MG tablet Commonly known as:  LASIX TAKE 1 TABLET BY MOUTH EVERY DAY   L-Arginine 1000 MG Tabs Take 1 tablet by mouth 3 (three) times daily.  levothyroxine 100 MCG tablet Commonly known as:  SYNTHROID, LEVOTHROID TAKE 1 TABLET EVERY DAY ON EMPTY STOMACHWITH A GLASS OF WATER AT LEAST 30-60 MINBEFORE BREAKFAST   lisinopril 10 MG tablet Commonly known as:  PRINIVIL,ZESTRIL Take 1 tablet (10 mg total) by mouth daily. Start taking on:  09/10/2017 What  changed:    medication strength  how much to take   magnesium oxide 400 MG tablet Commonly known as:  MAG-OX Take 400 mg by mouth 2 (two) times daily.   metFORMIN 500 MG tablet Commonly known as:  GLUCOPHAGE Take 500 mg by mouth 2 (two) times daily with a meal.   metoprolol succinate 25 MG 24 hr tablet Commonly known as:  TOPROL-XL TAKE 1 TABLET BY MOUTH EVERY DAY   pantoprazole 40 MG tablet Commonly known as:  PROTONIX Take 40 mg by mouth daily.   QUEtiapine 25 MG tablet Commonly known as:  SEROQUEL Take 1 tablet by mouth at bedtime.        DISCHARGE INSTRUCTIONS:  See AVS. If you experience worsening of your admission symptoms, develop shortness of breath, life threatening emergency, suicidal or homicidal thoughts you must seek medical attention immediately by calling 911 or calling your MD immediately  if symptoms less severe.  You Must read complete instructions/literature along with all the possible adverse reactions/side effects for all the Medicines you take and that have been prescribed to you. Take any new Medicines after you have completely understood and accpet all the possible adverse reactions/side effects.   Please note  You were cared for by a hospitalist during your hospital stay. If you have any questions about your discharge medications or the care you received while you were in the hospital after you are discharged, you can call the unit and asked to speak with the hospitalist on call if the hospitalist that took care of you is not available. Once you are discharged, your primary care physician will handle any further medical issues. Please note that NO REFILLS for any discharge medications will be authorized once you are discharged, as it is imperative that you return to your primary care physician (or establish a relationship with a primary care physician if you do not have one) for your aftercare needs so that they can reassess your need for medications and  monitor your lab values.    On the day of Discharge:  VITAL SIGNS:  Blood pressure (!) 156/48, pulse 67, temperature 98.6 F (37 C), temperature source Oral, resp. rate 20, height 5\' 2"  (1.575 m), weight 159 lb (72.1 kg), SpO2 94 %. PHYSICAL EXAMINATION:  GENERAL:  83 y.o.-year-old patient lying in the bed with no acute distress.  EYES: Pupils equal, round, reactive to light and accommodation. No scleral icterus. Extraocular muscles intact.  HEENT: Head atraumatic, normocephalic. Oropharynx and nasopharynx clear.  NECK:  Supple, no jugular venous distention. No thyroid enlargement, no tenderness.  LUNGS: Normal breath sounds bilaterally, no wheezing, rales,rhonchi or crepitation. No use of accessory muscles of respiration.  CARDIOVASCULAR: S1, S2 normal. No murmurs, rubs, or gallops.  ABDOMEN: Soft, non-tender, non-distended. Bowel sounds present. No organomegaly or mass.  EXTREMITIES: No pedal edema, cyanosis, or clubbing.  NEUROLOGIC: Cranial nerves II through XII are intact. Muscle strength 5/5 in all extremities. Sensation intact. Gait not checked.  PSYCHIATRIC: The patient is alert and oriented x 3.  SKIN: No obvious rash, lesion, or ulcer.  DATA REVIEW:   CBC Recent Labs  Lab 09/09/17 0549  WBC 9.9  HGB 9.2*  HCT 28.6*  PLT 220    Chemistries  Recent Labs  Lab 09/08/17 1417 09/09/17 0549  NA 137 135  K 3.8 3.9  CL 103 104  CO2 23 26  GLUCOSE 186* 106*  BUN 29* 21*  CREATININE 1.18* 0.97  CALCIUM 9.4 8.4*  AST 27  --   ALT 15  --   ALKPHOS 68  --   BILITOT 0.4  --      Microbiology Results  Results for orders placed or performed during the hospital encounter of 01/26/17  Urine culture     Status: None   Collection Time: 01/26/17  1:35 PM  Result Value Ref Range Status   Specimen Description URINE, RANDOM  Final   Special Requests NONE  Final   Culture   Final    NO GROWTH Performed at [facility], [address] [place], [place] [id number]     Report Status 01/28/2017 FINAL  Final    RADIOLOGY:  Mr Laqueta JeanBrain W UEWo Contrast  Result Date: 09/09/2017 CLINICAL DATA:  83 year old female with intractable nausea, vomiting and hypertension. Headache. In the emergency department systolic pressure blood pressure is 230. EXAM: MRI HEAD WITHOUT AND WITH CONTRAST TECHNIQUE: Multiplanar, multiecho pulse sequences of the brain and surrounding structures were obtained without and with intravenous contrast. CONTRAST:  14mL MULTIHANCE GADOBENATE DIMEGLUMINE 529 MG/ML IV SOLN COMPARISON:  Head CT without contrast 09/08/2017. Brain MRI 01/10/2010. FINDINGS: Brain: No restricted diffusion to suggest acute infarction. No midline shift, mass effect, evidence of mass lesion, ventriculomegaly, extra-axial collection or acute intracranial hemorrhage. Cervicomedullary junction and pituitary are within normal limits. Cerebral volume is not significantly changed since 2011. Wallace CullensGray and white matter signal also has not significantly changed - with evidence of a chronic lacunar infarct at the left external capsule - but otherwise minimal nonspecific cerebral white matter signal changes. No cortical encephalomalacia or chronic cerebral blood products identified. No abnormal enhancement identified. No dural thickening. Vascular: Major intracranial vascular flow voids are stable since 2011. The distal left vertebral artery appears dominant. The major dural venous sinuses are enhancing and appear to be patent. Skull and upper cervical spine: Visible cervical spine is negative for age. Normal bone marrow signal. Sinuses/Orbits: Stable since 2011. Other: Stable mild left mastoid effusion which appears inconsequential. Grossly normal visible internal auditory structures. Scalp and face soft tissues appear negative. IMPRESSION: No acute intracranial abnormality. Largely stable since 2011 and unremarkable for age MRI appearance of the brain. Electronically Signed   By: Odessa FlemingH  Hall M.D.   On:  09/09/2017 13:37   Ct Abdomen Pelvis W Contrast  Result Date: 09/08/2017 CLINICAL DATA:  [place] was at [employer] eating and was fine before going to meet family. EMS reports that she laid her head on the table and told them she felt like she was going to pass out. She than began to vomit. She keeps saying "I think there is something wrong with my head EXAM: CT ABDOMEN AND PELVIS WITH CONTRAST TECHNIQUE: Multidetector CT imaging of the abdomen and pelvis was performed using the standard protocol following bolus administration of intravenous contrast. CONTRAST:  75mL ISOVUE-300 IOPAMIDOL (ISOVUE-300) INJECTION 61% COMPARISON:  None. FINDINGS: Lower chest: No acute findings. Calcified granuloma at the left lung base. Dense coronary artery calcifications. Hepatobiliary: Scattered calcifications consistent healed granuloma. Liver normal in size and overall attenuation. No mass. Normal gallbladder. No bile duct dilation. Pancreas: Choose 1 Spleen: Multiple splenic calcifications consistent with healed granuloma. Spleen normal in size. No  mass or focal lesion. Adrenals/Urinary Tract: No adrenal masses. Bilateral renal cortical thinning. No renal masses, stones or hydronephrosis. Normal ureters. Bladder is unremarkable. Stomach/Bowel: Stomach and small bowel unremarkable. There are numerous sigmoid diverticula. No diverticulitis. Colon otherwise unremarkable. Normal appendix visualized. Vascular/Lymphatic: No significant aortic atherosclerotic disease. Marked atherosclerotic calcifications are noted at the origin of the superior mesenteric artery, which appears severely narrowed. There is focal dilation of the infrarenal abdominal aorta to 2.9 cm. No adenopathy. Reproductive: Uterus and bilateral adnexa are unremarkable. Other: No abdominal wall hernia or abnormality. No abdominopelvic ascites. Musculoskeletal: No fracture or acute finding. No osteoblastic or osteolytic lesions. Bones are demineralized. There are  significant degenerative changes throughout the visualized spine. IMPRESSION: 1. No acute findings within the abdomen or pelvis. 2. 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 3. Infrarenal abdominal aortic aneurysm measuring 2.9 cm. Ectatic abdominal aorta at risk for aneurysm development. Recommend followup by ultrasound in 5 years. This recommendation follows ACR consensus guidelines: White Paper of the ACR Incidental Findings Committee II on Vascular Findings. J Am Coll Radiol 2013; 10:789-794. 4. Numerous sigmoid colon diverticula.  No diverticulitis. Electronically Signed   By: Amie Portland M.D.   On: 09/08/2017 15:04     Management plans discussed with the patient, daughter, son and other FM and they are in agreement.  CODE STATUS: Full Code   TOTAL TIME TAKING CARE OF THIS PATIENT: 46 minutes.    Shaune Pollack M.D on 09/09/2017 at 2:45 PM  Between 7am to 6pm - Pager - [phone]  After 6pm go to [email or web address] - Social research officer, government  [facility]  Office  [phone]  CC: Primary care physician; Marguarite Arbour, MD   Note: This dictation was prepared with Dragon dictation along with smaller phrase technology. Any transcriptional errors that result from this process are unintentional.

## 2017-09-09 NOTE — ED Triage Notes (Signed)
Patient presents to ED via POV from home for hypertension. Patient was just discharged from this facility for the same. Patients family reports her blood pressure spiked at home. Patient denies any pain. Family report patient was discharged with a lisnopril rx for 10mg . Family reports patient is already taking 10mg  but they made a mistake and told the doctor she was on 5mg .

## 2017-09-09 NOTE — Discharge Instructions (Signed)
Heart healthy and ADA diet. °

## 2017-09-09 NOTE — ED Notes (Signed)
Floor nurse requesting MD be asked about PRN medications and fluids. MD verbalized to this RN no IV mediations at this time. PRN BP medications placed in orders and that he does want KVO fluids tonight. This RN called floor RN back and updated on verbal orders.

## 2017-09-09 NOTE — ED Provider Notes (Addendum)
[facility] Emergency Department Provider Note  ____________________________________________   I have reviewed the triage vital signs and the nursing notes. Where available I have reviewed prior notes and, if possible and indicated, outside hospital notes.    HISTORY  Chief Complaint Hypertension    HPI Melinda MillersMildred P Lewis is a 83 y.o. female with a history of hypertension presents today with hypertension she was discharged early this morning after diagnosis of hypertension.  Apparently there is some question about the patient being directly readmitted however she is now presenting in the.  She denies any chest pain shortness of breath nausea vomiting.  She states that she has had episodes where her arms go numb before her prior admission but not since she went home.  She has had negative CT scans.  She is essentially symptomatic with his elevated blood pressure.  She did take her home blood pressure measurements while she was in the waiting room here with some minimal results blood pressure currently 194/54.  Have discussed with Dr. Imogene Burnhen, the discharging physician and they agree to admit the patient      Past Medical History:  Diagnosis Date  . Acute on chronic systolic CHF (congestive heart failure) (HCC) 06/16/2014  . Anemia   . CAD (coronary artery disease) 8/15   s/p DES LCX and LAD   . Cardiomyopathy, ischemic 06/16/2014  . Diabetes mellitus without complication (HCC)   . Hypertension   . NSTEMI (non-ST elevated myocardial infarction) (HCC)   . Thyroid disease   . TIA (transient ischemic attack) 2010    Patient Active Problem List   Diagnosis Date Noted  . Nausea & vomiting 09/08/2017  . Chronic systolic CHF (congestive heart failure) (HCC) 03/04/2015  . Acute on chronic systolic CHF (congestive heart failure) (HCC) 06/16/2014  . Cardiomyopathy, ischemic 06/16/2014  . CAD, multiple vessel 06/15/2014  . Anemia 05/05/2014  . Hypothyroidism 05/05/2014   . 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/05/2014  . NSTEMI (non-ST elevated myocardial infarction) (HCC) 05/02/2014  . Hypertension 05/02/2014  . Pulmonary edema cardiac cause (HCC) 05/02/2014    Past Surgical History:  Procedure Laterality Date  . LEFT HEART CATHETERIZATION WITH CORONARY ANGIOGRAM N/A 05/03/2014   Procedure: LEFT HEART CATHETERIZATION WITH CORONARY ANGIOGRAM;  Surgeon: Peter M SwazilandJordan, MD;  [facility];  Service: Cardiovascular;  Laterality: N/A;  . PERCUTANEOUS CORONARY STENT INTERVENTION (PCI-S) Right 05/03/2014   Procedure: PERCUTANEOUS CORONARY STENT INTERVENTION (PCI-S);  Surgeon: Peter M SwazilandJordan, MD;  [facility];  Service: Cardiovascular;  Laterality: Right;  . PERCUTANEOUS CORONARY STENT INTERVENTION (PCI-S) N/A 05/04/2014   Procedure: PERCUTANEOUS CORONARY STENT INTERVENTION (PCI-S);  Surgeon: Peter M SwazilandJordan, MD;  [facility];  Service: Cardiovascular;  Laterality: N/A;    Prior to Admission medications   Medication Sig Start Date End Date Taking? Authorizing Provider  aspirin EC 81 MG tablet Take 81 mg by mouth daily.   Yes [provider]  atorvastatin (LIPITOR) 40 MG tablet TAKE 1 TABLET BY MOUTH DAILY AT 6PM 07/01/17  Yes SwazilandJordan, Peter M, MD  Flaxseed, Linseed, (FLAX SEED OIL PO) Take 1 tablet by mouth daily.   Yes [provider]  furosemide (LASIX) 20 MG tablet TAKE 1 TABLET BY MOUTH EVERY DAY 05/15/17  Yes SwazilandJordan, Peter M, MD  L-Arginine 1000 MG TABS Take 1 tablet by mouth 3 (three) times daily.   Yes [provider]  levothyroxine (SYNTHROID, LEVOTHROID) 100 MCG tablet TAKE 1 TABLET EVERY DAY ON  EMPTY STOMACHWITH A GLASS OF WATER AT LEAST 30-60 MINBEFORE BREAKFAST 02/09/17  Yes [provider]  lisinopril (PRINIVIL,ZESTRIL) 10 MG tablet Take 1 tablet (10 mg total) by mouth daily. 09/10/17  Yes Shaune Pollack, MD  magnesium oxide (MAG-OX) 400 MG tablet Take 400 mg by mouth 2 (two) times daily.   Yes  [provider]  metFORMIN (GLUCOPHAGE) 500 MG tablet Take 500 mg by mouth 2 (two) times daily with a meal.   Yes [provider]  metoprolol succinate (TOPROL-XL) 25 MG 24 hr tablet TAKE 1 TABLET BY MOUTH EVERY DAY 03/15/17  Yes Swazil[place], Peter M, MD  pantoprazole (PROTONIX) 40 MG tablet Take 40 mg by mouth daily.   Yes [provider]  QUEtiapine (SEROQUEL) 25 MG tablet Take 1 tablet by mouth at bedtime. 12/17/16  Yes [provider]    Allergies Cefuroxime axetil; Clonidine derivatives; Epinephrine; Penicillins; and Septra [sulfamethoxazole-trimethoprim]  No family history on file.  Social History Social History   Tobacco Use  . Smoking status: Former Smoker    Types: Cigarettes  . Smokeless tobacco: Never Used  Substance Use Topics  . Alcohol use: No  . Drug use: No    Review of Systems Constitutional: No fever/chills Eyes: No visual changes. ENT: No sore throat. No stiff neck no neck pain Cardiovascular: Denies chest pain. Respiratory: Denies shortness of breath. Gastrointestinal:   no vomiting.  No diarrhea.  No constipation. Genitourinary: Negative for dysuria. Musculoskeletal: Negative lower extremity swelling Skin: Negative for rash. Neurological: Negative for severe headaches, focal weakness or numbness.   ____________________________________________   PHYSICAL EXAM:  VITAL SIGNS: ED Triage Vitals [09/09/17 1721]  Enc Vitals Group     BP (!) 218/75     Pulse Rate 74     Resp 14     Temp 97.6 F (36.4 C)     Temp Source Oral     SpO2 97 %     Weight      Height      Head Circumference      Peak Flow      Pain Score      Pain Loc      Pain Edu?      Excl. in GC?     Constitutional: Alert and oriented. Well appearing and in no acute distress. Eyes: Conjunctivae are normal Head: Atraumatic HEENT: No congestion/rhinnorhea. Mucous membranes are moist.  Oropharynx non-erythematous Neck:   Nontender with no  meningismus, no masses, no stridor Cardiovascular: Normal rate, regular rhythm. Grossly normal heart sounds.  Good peripheral circulation. Respiratory: Normal respiratory effort.  No retractions. Lungs CTAB. Abdominal: Soft and nontender. No distention. No guarding no rebound Back:  There is no focal tenderness or step off.  there is no midline tenderness there are no lesions noted. there is no CVA tenderness  Musculoskeletal: No lower extremity tenderness, no upper extremity tenderness. No joint effusions, no DVT signs strong distal pulses no edema Neurologic:  Normal speech and language. No gross focal neurologic deficits are appreciated.  Skin:  Skin is warm, dry and intact. No rash noted. Psychiatric: Mood and affect are normal. Speech and behavior are normal.  ____________________________________________   LABS (all labs ordered are listed, but only abnormal results are displayed)  Labs Reviewed - No data to display  Pertinent labs  results that were available during my care of the patient were reviewed by me and considered in my medical decision making (see chart for details). ____________________________________________  EKG  I personally interpreted any EKGs ordered by me or triage  ____________________________________________  RADIOLOGY  Pertinent labs & imaging results that were available during my care of the patient were reviewed by me and considered in my medical decision making (see chart for details). If possible, patient and/or family made aware of any abnormal findings.  Mr Laqueta Jean Wo Contrast  Result Date: 09/09/2017 CLINICAL DATA:  83 year old female with intractable nausea, vomiting and hypertension. Headache. In the emergency department systolic pressure blood pressure is 230. EXAM: MRI HEAD WITHOUT AND WITH CONTRAST TECHNIQUE: Multiplanar, multiecho pulse sequences of the brain and surrounding structures were obtained without and with intravenous contrast.  CONTRAST:  14mL MULTIHANCE GADOBENATE DIMEGLUMINE 529 MG/ML IV SOLN COMPARISON:  Head CT without contrast 09/08/2017. Brain MRI 01/10/2010. FINDINGS: Brain: No restricted diffusion to suggest acute infarction. No midline shift, mass effect, evidence of mass lesion, ventriculomegaly, extra-axial collection or acute intracranial hemorrhage. Cervicomedullary junction and pituitary are within normal limits. Cerebral volume is not significantly changed since 2011. Wallace Cullens and white matter signal also has not significantly changed - with evidence of a chronic lacunar infarct at the left external capsule - but otherwise minimal nonspecific cerebral white matter signal changes. No cortical encephalomalacia or chronic cerebral blood products identified. No abnormal enhancement identified. No dural thickening. Vascular: Major intracranial vascular flow voids are stable since 2011. The distal left vertebral artery appears dominant. The major dural venous sinuses are enhancing and appear to be patent. Skull and upper cervical spine: Visible cervical spine is negative for age. Normal bone marrow signal. Sinuses/Orbits: Stable since 2011. Other: Stable mild left mastoid effusion which appears inconsequential. Grossly normal visible internal auditory structures. Scalp and face soft tissues appear negative. IMPRESSION: No acute intracranial abnormality. Largely stable since 2011 and unremarkable for age MRI appearance of the brain. Electronically Signed   By: Odessa Fleming M.D.   On: 09/09/2017 13:37   ____________________________________________    PROCEDURES  Procedure(s) performed: None  Procedures  Critical Care performed: None  ____________________________________________   INITIAL IMPRESSION / ASSESSMENT AND PLAN / ED COURSE  Pertinent labs & imaging results that were available during my care of the patient were reviewed by me and considered in my medical decision making (see chart for details).  Gust extensively  with the hospitalist service, they will start orders in this patient and readmit as she would just hours before her return.  Her blood pressure is elevated but she is a symptomatically we will start her on some low-dose labetalol, will check basic blood work and she will be admitted  ----------------------------------------- 9:17 PM on 09/09/2017 -----------------------------------------  P blood pressure in the 160s, patient remains asymptomatic, hospitalist states that they will take care of orders for this patient we will DC the labetalol but I initially ordered given her blood pressure correcting at this time but we will still observe her in the hospital    ____________________________________________   FINAL CLINICAL IMPRESSION(S) / ED DIAGNOSES  Final diagnoses:  None      This chart was dictated using voice recognition software.  Despite best efforts to proofread,  errors can occur which can change meaning.      Jeanmarie Plant, MD 09/09/17 2107    Jeanmarie Plant, MD 09/09/17 2117

## 2017-09-09 NOTE — ED Notes (Signed)
Dr. Imogene Burn paged and called this RN. See orders. Dr. Imogene Burn states patient needs to be seen by an EDP. If BP is less than 200 systolic after oral medication patient may be able to go home. If it is above 200 patient will require IV medication and possible readmission per Dr. Imogene Burn.

## 2017-09-09 NOTE — Care Management Note (Signed)
Case Management Note  Patient Details  Name: Melinda Lewis MRN: [id number] Date of Birth: 05-10-1918  Subjective/Objective:  Admitted to [facility] under observation with the diagnosis nausea/vomiting. Lives alone. Daughter is [name] [phone]). Last seen Dr. Judithann Sheen in September. Prescriptions are filled at [facility]. No Home Health. No skilled Nursing. No home oxygen. Rolling walker and cane in the home. Takes care of all basic activities of daily living herself, can drive, if needed. No falls. Weight gain of 8 pounds. Family will transport                  Action/Plan: Scheduled MRI for today. Will continue to follow for discharge needs    Expected Discharge Date:  09/09/17               Expected Discharge Plan:     In-House Referral:     Discharge planning Services     Post Acute Care Choice:    Choice offered to:     DME Arranged:    DME Agency:     HH Arranged:    HH Agency:     Status of Service:     If discussed at [employer], dates discussed:    Additional Comments:  Gwenette Greet, RN MSN CCM Care Management [phone] 09/09/2017, 10:04 AM

## 2017-09-10 ENCOUNTER — Other Ambulatory Visit: Payer: Self-pay

## 2017-09-10 DIAGNOSIS — R42 Dizziness and giddiness: Secondary | ICD-10-CM

## 2017-09-10 DIAGNOSIS — R55 Syncope and collapse: Secondary | ICD-10-CM | POA: Diagnosis not present

## 2017-09-10 LAB — GLUCOSE, CAPILLARY: GLUCOSE-CAPILLARY: 121 mg/dL — AB (ref 65–99)

## 2017-09-10 LAB — CBC
HCT: 30 % — ABNORMAL LOW (ref 35.0–47.0)
HEMOGLOBIN: 9.6 g/dL — AB (ref 12.0–16.0)
MCH: 26.4 pg (ref 26.0–34.0)
MCHC: 32 g/dL (ref 32.0–36.0)
MCV: 82.6 fL (ref 80.0–100.0)
Platelets: 219 10*3/uL (ref 150–440)
RBC: 3.64 MIL/uL — ABNORMAL LOW (ref 3.80–5.20)
RDW: 16 % — AB (ref 11.5–14.5)
WBC: 9 10*3/uL (ref 3.6–11.0)

## 2017-09-10 LAB — BASIC METABOLIC PANEL
ANION GAP: 6 (ref 5–15)
BUN: 15 mg/dL (ref 6–20)
CALCIUM: 8.5 mg/dL — AB (ref 8.9–10.3)
CO2: 26 mmol/L (ref 22–32)
CREATININE: 0.97 mg/dL (ref 0.44–1.00)
Chloride: 103 mmol/L (ref 101–111)
GFR calc Af Amer: 55 mL/min — ABNORMAL LOW (ref >60)
GFR, EST NON AFRICAN AMERICAN: 47 mL/min — AB (ref >60)
GLUCOSE: 105 mg/dL — AB (ref 65–99)
Potassium: 3.6 mmol/L (ref 3.5–5.1)
Sodium: 135 mmol/L (ref 135–145)

## 2017-09-10 MED ORDER — LISINOPRIL 20 MG PO TABS: 20.0000 mg | ORAL_TABLET | Freq: Every day | ORAL | 0 refills | Status: DC

## 2017-09-10 MED ORDER — AMLODIPINE BESYLATE 5 MG PO TABS
2.5000 mg | ORAL_TABLET | Freq: Every day | ORAL | Status: DC
  Administered 2017-09-10 – 2017-09-11 (×2): 2.5 mg via ORAL
  Filled 2017-09-10 (×2): qty 1

## 2017-09-10 MED ORDER — AMLODIPINE BESYLATE 2.5 MG PO TABS: 2.5000 mg | ORAL_TABLET | Freq: Every day | ORAL | 0 refills | Status: DC

## 2017-09-10 NOTE — Discharge Instructions (Signed)
Resume diet and activity as before ° ° °

## 2017-09-10 NOTE — Consult Note (Signed)
Cardiology Consultation:   Patient ID: Melinda Lewis; [id number]; 1918-02-05   Admit date: 09/09/2017 Date of Consult: 09/10/2017  Primary Care Provider: Marguarite Arbour, MD Primary Cardiologist: Dr. Swaziland  Primary Electrophysiologist:  n/a   Patient Profile:   Melinda Lewis is a 82 y.o. female with a hx of coronary artery disease status post LAD/LCx stent placement in 2015, hypertension, hyperlipidemia and type 2 diabetes  who is being seen today for the evaluation of dizziness at the request of Dr. Sherryll Lewis.  History of Present Illness:   Melinda Lewis is a 83 year old female with known history of coronary artery disease as outlined above, hypertension, hyperlipidemia and diabetes mellitus who presents with recurrent orthostatic dizziness.  She was first hospitalized on Sunday when she felt extremely dizzy and nauseous while she was eating at [employer].  She was noted to be hypertensive with slightly elevated creatinine and BUN.  CT and MRI of the head were unremarkable.  The patient was discharged home but presented back yesterday with recurrent dizziness and presyncope.  She denies any chest pain or shortness of breath.  She denies vertigo.  She reports that she gets dizzy every time she sits up or stands up.  There has been no loss of consciousness.  This has been associated with nausea.   Past Medical History:  Diagnosis Date  . Acute on chronic systolic CHF (congestive heart failure) (HCC) 06/16/2014  . Anemia   . CAD (coronary artery disease) 8/15   s/p DES LCX and LAD   . Cardiomyopathy, ischemic 06/16/2014  . Diabetes mellitus without complication (HCC)   . Hypertension   . NSTEMI (non-ST elevated myocardial infarction) (HCC)   . Thyroid disease   . TIA (transient ischemic attack) 2010    Past Surgical History:  Procedure Laterality Date  . LEFT HEART CATHETERIZATION WITH CORONARY ANGIOGRAM N/A 05/03/2014   Procedure: LEFT HEART CATHETERIZATION WITH CORONARY  ANGIOGRAM;  Surgeon: Peter M Swaziland, MD;  Location: [facility] CATH LAB;  Service: Cardiovascular;  Laterality: N/A;  . PERCUTANEOUS CORONARY STENT INTERVENTION (PCI-S) Right 05/03/2014   Procedure: PERCUTANEOUS CORONARY STENT INTERVENTION (PCI-S);  Surgeon: Peter M Swaziland, MD;  Location: [facility] CATH LAB;  Service: Cardiovascular;  Laterality: Right;  . PERCUTANEOUS CORONARY STENT INTERVENTION (PCI-S) N/A 05/04/2014   Procedure: PERCUTANEOUS CORONARY STENT INTERVENTION (PCI-S);  Surgeon: Peter M Swaziland, MD;  Location: [facility] CATH LAB;  Service: Cardiovascular;  Laterality: N/A;     Home Medications:  Prior to Admission medications   Medication Sig Start Date End Date Taking? Authorizing Provider  aspirin EC 81 MG tablet Take 81 mg by mouth daily.   Yes [provider]  atorvastatin (LIPITOR) 40 MG tablet TAKE 1 TABLET BY MOUTH DAILY AT 6PM 07/01/17  Yes Melinda Lewis, Peter M, MD  Flaxseed, Linseed, (FLAX SEED OIL PO) Take 1 tablet by mouth daily.   Yes [provider]  furosemide (LASIX) 20 MG tablet TAKE 1 TABLET BY MOUTH EVERY DAY 05/15/17  Yes Melinda Lewis, Peter M, MD  L-Arginine 1000 MG TABS Take 1 tablet by mouth 3 (three) times daily.   Yes [provider]  levothyroxine (SYNTHROID, LEVOTHROID) 100 MCG tablet TAKE 1 TABLET EVERY DAY ON EMPTY STOMACHWITH A GLASS OF WATER AT LEAST 30-60 MINBEFORE BREAKFAST 02/09/17  Yes [provider]  lisinopril (PRINIVIL,ZESTRIL) 10 MG tablet Take 1 tablet (10 mg total) by mouth daily. 09/10/17  Yes Shaune Pollack, MD  magnesium oxide (MAG-OX) 400 MG tablet Take 400 mg by mouth 2 (  two) times daily.   Yes [provider]  metFORMIN (GLUCOPHAGE) 500 MG tablet Take 500 mg by mouth 2 (two) times daily with a meal.   Yes [provider]  metoprolol succinate (TOPROL-XL) 25 MG 24 hr tablet TAKE 1 TABLET BY MOUTH EVERY DAY 03/15/17  Yes Melinda Lewis, Peter M, MD  pantoprazole (PROTONIX) 40 MG tablet Take 40 mg by mouth daily.   Yes [provider]  QUEtiapine (SEROQUEL) 25 MG tablet Take 1 tablet by mouth at bedtime. 12/17/16  Yes [provider]    Inpatient Medications: Scheduled Meds: . amLODipine  2.5 mg Oral Daily  . aspirin EC  81 mg Oral Daily  . docusate sodium  100 mg Oral BID  . furosemide  20 mg Oral Daily  . heparin  5,000 Units Subcutaneous Q8H  . levothyroxine  100 mcg Oral QAC breakfast  . lisinopril  20 mg Oral Daily  . magnesium oxide  400 mg Oral BID  . metFORMIN  500 mg Oral BID WC  . metoprolol succinate  25 mg Oral Daily  . pantoprazole  40 mg Oral Daily  . QUEtiapine  25 mg Oral QHS   Continuous Infusions:  PRN Meds: acetaminophen **OR** acetaminophen, bisacodyl, HYDROcodone-acetaminophen, metoprolol tartrate, ondansetron **OR** ondansetron (ZOFRAN) IV, traZODone  Allergies:    Allergies  Allergen Reactions  . Cefuroxime Axetil Nausea Only    cramps  . Clonidine Derivatives Other (See Comments)    Maker her pass out  . Epinephrine   . Penicillins   . Septra [Sulfamethoxazole-Trimethoprim] Nausea And Vomiting    Social History:   Social History   Socioeconomic History  . Marital status: Widowed    Spouse name: Not on file  . Number of children: Not on file  . Years of education: Not on file  . Highest education level: Not on file  Social Needs  . Financial resource strain: Not on file  . Food insecurity - worry: Not on file  . Food insecurity - inability: Not on file  . Transportation needs - medical: Not on file  . Transportation needs - non-medical: Not on file  Occupational History  . Not on file  Tobacco Use  . Smoking status: Former Smoker    Types: Cigarettes  . Smokeless tobacco: Never Used  Substance and Sexual Activity  . Alcohol use: No  . Drug use: No  . Sexual activity: Not on file  Other Topics Concern  . Not on file  Social History Narrative  . Not on file    Family History:   History reviewed. No pertinent family history.   ROS:    Please see the history of present illness.  ROS  All other ROS reviewed and negative.     Physical Exam/Data:   Vitals:   09/10/17 0000 09/10/17 0444 09/10/17 0840 09/10/17 1000  BP:  (!) 155/40 (!) 168/55   Pulse:  69 75 80  Resp:  16  16  Temp:  98.6 F (37 C) 98.3 F (36.8 C) 97.6 F (36.4 C)  TempSrc:  Oral Oral   SpO2:  93% 95% 95%  Weight: 156 lb 4.8 oz (70.9 kg) 157 lb 12.8 oz (71.6 kg)    Height: 5\' 2"  (1.575 m)       Intake/Output Summary (Last 24 hours) at 09/10/2017 1239 Last data filed at 09/10/2017 1233 Gross per 24 hour  Intake 61.33 ml  Output 600 ml  Net -538.67 ml   [employer]  09/10/17 0000 09/10/17 0444  Weight: 156 lb 4.8 oz (70.9 kg) 157 lb 12.8 oz (71.6 kg)   Body mass index is 28.86 kg/m.  General:  Well nourished, well developed, in no acute distress HEENT: normal Lymph: no adenopathy Neck: no JVD Endocrine:  No thryomegaly Vascular: No carotid bruits; FA pulses 2+ bilaterally without bruits  Cardiac:  normal S1, S2; RRR; 1 out of 6 systolic ejection murmur in the aortic area Lungs:  clear to auscultation bilaterally, no wheezing, rhonchi or rales  Abd: soft, nontender, no hepatomegaly  Ext: no edema Musculoskeletal:  No deformities, BUE and BLE strength normal and equal Skin: warm and dry  Neuro:  CNs 2-12 intact, no focal abnormalities noted Psych:  Normal affect   EKG:  The EKG was personally reviewed and demonstrates: Not performed.  This was ordered. Telemetry:  Telemetry was personally reviewed and demonstrates: Sinus rhythm with no significant arrhythmia.  Relevant CV Studies:   Laboratory Data:  Chemistry Recent Labs  Lab 09/08/17 1417 09/09/17 0549 09/10/17 0443  NA 137 135 135  K 3.8 3.9 3.6  CL 103 104 103  CO2 23 26 26   GLUCOSE 186* 106* 105*  BUN 29* 21* 15  CREATININE 1.18* 0.97 0.97  CALCIUM 9.4 8.4* 8.5*  GFRNONAA 37* 47* 47*  GFRAA 43* 55* 55*  ANIONGAP 11 5 6     Recent Labs  Lab 09/08/17 1417   PROT 7.4  ALBUMIN 3.9  AST 27  ALT 15  ALKPHOS 68  BILITOT 0.4   Hematology Recent Labs  Lab 09/08/17 1504 09/09/17 0549 09/10/17 0443  WBC 11.1* 9.9 9.0  RBC 4.02 3.46* 3.64*  HGB 10.7* 9.2* 9.6*  HCT 34.1* 28.6* 30.0*  MCV 84.8 82.8 82.6  MCH 26.5 26.6 26.4  MCHC 31.3* 32.2 32.0  RDW 16.0* 15.9* 16.0*  PLT 215 220 219   Cardiac Enzymes Recent Labs  Lab 09/08/17 1417  TROPONINI <0.03   No results for input(s): TROPIPOC in the last 168 hours.  BNPNo results for input(s): BNP, PROBNP in the last 168 hours.  DDimer No results for input(s): DDIMER in the last 168 hours.  Radiology/Studies:  Ct Head Wo Contrast  Result Date: 09/08/2017 CLINICAL DATA:  Near-syncope and vomiting today while eating. EXAM: CT HEAD WITHOUT CONTRAST TECHNIQUE: Contiguous axial images were obtained from the base of the skull through the vertex without intravenous contrast. COMPARISON:  Head CT and brain MRI 01/10/2010. FINDINGS: Brain: There is some cortical atrophy. No evidence of acute abnormality including hemorrhage, infarct, mass lesion, mass effect, midline shift or abnormal extra-axial fluid collection. No hydrocephalus or pneumocephalus. Vascular: Atherosclerosis noted. Skull: Intact Sinuses/Orbits: Negative. Other: None. IMPRESSION: No acute abnormality. Cortical atrophy. Atherosclerosis. Electronically Signed   By: Drusilla Kannerhomas  Dalessio M.D.   On: 09/08/2017 14:46   Mr Laqueta JeanBrain W ZOWo Contrast  Result Date: 09/09/2017 CLINICAL DATA:  83 year old female with intractable nausea, vomiting and hypertension. Headache. In the emergency department systolic pressure blood pressure is 230. EXAM: MRI HEAD WITHOUT AND WITH CONTRAST TECHNIQUE: Multiplanar, multiecho pulse sequences of the brain and surrounding structures were obtained without and with intravenous contrast. CONTRAST:  14mL MULTIHANCE GADOBENATE DIMEGLUMINE 529 MG/ML IV SOLN COMPARISON:  Head CT without contrast 09/08/2017. Brain MRI 01/10/2010.  FINDINGS: Brain: No restricted diffusion to suggest acute infarction. No midline shift, mass effect, evidence of mass lesion, ventriculomegaly, extra-axial collection or acute intracranial hemorrhage. Cervicomedullary junction and pituitary are within normal limits. Cerebral volume is not significantly changed since 2011. Wallace CullensGray  and white matter signal also has not significantly changed - with evidence of a chronic lacunar infarct at the left external capsule - but otherwise minimal nonspecific cerebral white matter signal changes. No cortical encephalomalacia or chronic cerebral blood products identified. No abnormal enhancement identified. No dural thickening. Vascular: Major intracranial vascular flow voids are stable since 2011. The distal left vertebral artery appears dominant. The major dural venous sinuses are enhancing and appear to be patent. Skull and upper cervical spine: Visible cervical spine is negative for age. Normal bone marrow signal. Sinuses/Orbits: Stable since 2011. Other: Stable mild left mastoid effusion which appears inconsequential. Grossly normal visible internal auditory structures. Scalp and face soft tissues appear negative. IMPRESSION: No acute intracranial abnormality. Largely stable since 2011 and unremarkable for age MRI appearance of the brain. Electronically Signed   By: Odessa Fleming M.D.   On: 09/09/2017 13:37   Ct Abdomen Pelvis W Contrast  Result Date: 09/08/2017 CLINICAL DATA:  Pt was at [employer] and was fine before going to meet family. EMS reports that she laid her head on the table and told them she felt like she was going to pass out. She than began to vomit. She keeps saying "I think there is something wrong with my head EXAM: CT ABDOMEN AND PELVIS WITH CONTRAST TECHNIQUE: Multidetector CT imaging of the abdomen and pelvis was performed using the standard protocol following bolus administration of intravenous contrast. CONTRAST:  75mL ISOVUE-300 IOPAMIDOL  (ISOVUE-300) INJECTION 61% COMPARISON:  None. FINDINGS: Lower chest: No acute findings. Calcified granuloma at the left lung base. Dense coronary artery calcifications. Hepatobiliary: Scattered calcifications consistent healed granuloma. Liver normal in size and overall attenuation. No mass. Normal gallbladder. No bile duct dilation. Pancreas: Choose 1 Spleen: Multiple splenic calcifications consistent with healed granuloma. Spleen normal in size. No mass or focal lesion. Adrenals/Urinary Tract: No adrenal masses. Bilateral renal cortical thinning. No renal masses, stones or hydronephrosis. Normal ureters. Bladder is unremarkable. Stomach/Bowel: Stomach and small bowel unremarkable. There are numerous sigmoid diverticula. No diverticulitis. Colon otherwise unremarkable. Normal appendix visualized. Vascular/Lymphatic: No significant aortic atherosclerotic disease. Marked atherosclerotic calcifications are noted at the origin of the superior mesenteric artery, which appears severely narrowed. There is focal dilation of the infrarenal abdominal aorta to 2.9 cm. No adenopathy. Reproductive: Uterus and bilateral adnexa are unremarkable. Other: No abdominal wall hernia or abnormality. No abdominopelvic ascites. Musculoskeletal: No fracture or acute finding. No osteoblastic or osteolytic lesions. Bones are demineralized. There are significant degenerative changes throughout the visualized spine. IMPRESSION: 1. No acute findings within the abdomen or pelvis. 2. 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 3. Infrarenal abdominal aortic aneurysm measuring 2.9 cm. Ectatic abdominal aorta at risk for aneurysm development. Recommend followup by ultrasound in 5 years. This recommendation follows ACR consensus guidelines: White Paper of the ACR Incidental Findings Committee II on Vascular Findings. J Am Coll Radiol 2013; 10:789-794. 4. Numerous sigmoid  colon diverticula.  No diverticulitis. Electronically Signed   By: Amie Portland M.D.   On: 09/08/2017 15:04   Dg Chest Portable 1 View  Result Date: 09/08/2017 CLINICAL DATA:  Intractable vomiting, sudden onset EXAM: PORTABLE CHEST 1 VIEW COMPARISON:  01/23/2017 FINDINGS: There is a calcified left lung pulmonary nodule consistent with prior granulomas disease. There is no focal parenchymal opacity. There is no pleural effusion or pneumothorax. The heart and mediastinal contours are unremarkable. There is thoracic aortic atherosclerosis. Severe osteoarthritis of bilateral glenohumeral joints.  IMPRESSION: No active disease. Electronically Signed   By: Elige Ko   On: 09/08/2017 14:21    Assessment and Plan:   1. Recurrent dizziness and presyncope: The exact etiology is not entirely clear.  Most of her symptoms seem to be orthostatic.  However, orthostatic vitals were unremarkable and there was no significant drop in blood pressure with standing up.  History is not suggestive of arrhythmia and telemetry was reviewed carefully with no evidence of significant arrhythmia.  Possibly vasovagal with component of anxiety versus an inner ear problem.  I order an EKG given that she did not have one performed.  Previous echocardiogram showed normal LV systolic function and current cardiac exam is unremarkable. 2. Coronary artery disease with previous stenting of the LAD and left circumflex: Currently with no anginal symptoms.  Troponin was negative. 3. Essential hypertension: Blood pressure fluctuates significantly but overall has been elevated.  I agree with increasing antihypertensive medications.   For questions or updates, please contact [facility] Please consult [email or web address] for contact info under Cardiology/STEMI.   Signed, Lorine Bears, MD  09/10/2017 12:39 PM

## 2017-09-10 NOTE — Progress Notes (Signed)
PHARMACIST - PHYSICIAN ORDER COMMUNICATION  CONCERNING: P&T Medication Policy on Herbal Medications  DESCRIPTION:  This patient's order for:  L-arginine  has been noted.  This product(s) is classified as an "herbal" or natural product. Due to a lack of definitive safety studies or FDA approval, nonstandard manufacturing practices, plus the potential risk of unknown drug-drug interactions while on inpatient medications, the Pharmacy and Therapeutics Committee does not permit the use of "herbal" or natural products of this type within [place].   ACTION TAKEN: The pharmacy department is unable to verify this order at this time.  Please reevaluate patient's clinical condition at discharge and address if the herbal or natural product(s) should be resumed at that time.  

## 2017-09-11 DIAGNOSIS — I10 Essential (primary) hypertension: Secondary | ICD-10-CM | POA: Diagnosis not present

## 2017-09-11 LAB — POCT I-STAT CREATININE: Creatinine, Ser: 1.3 mg/dL — ABNORMAL HIGH (ref 0.44–1.00)

## 2017-09-11 LAB — GLUCOSE, CAPILLARY: Glucose-Capillary: 101 mg/dL — ABNORMAL HIGH (ref 65–99)

## 2017-09-11 MED ORDER — SODIUM CHLORIDE 0.9% FLUSH
3.0000 mL | Freq: Two times a day (BID) | INTRAVENOUS | Status: DC
  Administered 2017-09-11 – 2017-09-12 (×3): 3 mL via INTRAVENOUS

## 2017-09-11 MED ORDER — METOPROLOL TARTRATE 5 MG/5ML IV SOLN
5.0000 mg | Freq: Once | INTRAVENOUS | Status: AC
  Administered 2017-09-11: 5 mg via INTRAVENOUS
  Filled 2017-09-11: qty 5

## 2017-09-11 NOTE — Progress Notes (Signed)
BP at 1620 was 181/59.  Gave her prn Metoprolol 5mg  slow IVP at 1630.  Follow up BP at 1730 is 211/64.  Dr. Elpidio Anis paged.  Will recheck it in 1 hour.  If SBP is above 200 will repeat the metoprolol.

## 2017-09-11 NOTE — Progress Notes (Signed)
[employer] - [place] at [facility]   PATIENT NAME: Melinda Lewis    MR#:  [id number]  DATE OF BIRTH:  November 03, 1917  SUBJECTIVE:  CHIEF COMPLAINT:   Chief Complaint  Patient presents with  . Hypertension   Blood pressure still fluctuating. No focal weakness. Had elevated blood pressure during her last office visit.  REVIEW OF SYSTEMS:    Review of Systems  Constitutional: Positive for malaise/fatigue. Negative for chills and fever.  HENT: Negative for sore throat.   Eyes: Negative for blurred vision, double vision and pain.  Respiratory: Negative for cough, hemoptysis, shortness of breath and wheezing.   Cardiovascular: Negative for chest pain, palpitations, orthopnea and leg swelling.  Gastrointestinal: Negative for abdominal pain, constipation, diarrhea, heartburn, nausea and vomiting.  Genitourinary: Negative for dysuria and hematuria.  Musculoskeletal: Negative for back pain and joint pain.  Skin: Negative for rash.  Neurological: Positive for dizziness. Negative for sensory change, speech change, focal weakness and headaches.  Endo/Heme/Allergies: Does not bruise/bleed easily.  Psychiatric/Behavioral: Negative for depression. The patient is not nervous/anxious.     DRUG ALLERGIES:   Allergies  Allergen Reactions  . Cefuroxime Axetil Nausea Only    cramps  . Clonidine Derivatives Other (See Comments)    Maker her pass out  . Epinephrine   . Penicillins   . Septra [Sulfamethoxazole-Trimethoprim] Nausea And Vomiting    VITALS:  Blood pressure (!) 211/69, pulse 74, temperature 98.1 F (36.7 C), temperature source Oral, resp. rate 14, height 5\' 2"  (1.575 m), weight 71.4 kg (157 lb 8 oz), SpO2 94 %.  PHYSICAL EXAMINATION:   Physical Exam  GENERAL:  83 y.o.-year-old patient lying in the bed with no acute distress.  EYES: Pupils equal, round, reactive to light and accommodation. No scleral icterus. Extraocular muscles intact.  HEENT: Head  atraumatic, normocephalic. Oropharynx and nasopharynx clear.  NECK:  Supple, no jugular venous distention. No thyroid enlargement, no tenderness.  LUNGS: Normal breath sounds bilaterally, no wheezing, rales, rhonchi. No use of accessory muscles of respiration.  CARDIOVASCULAR: S1, S2 normal. No murmurs, rubs, or gallops.  ABDOMEN: Soft, nontender, nondistended. Bowel sounds present. No organomegaly or mass.  EXTREMITIES: No cyanosis, clubbing or edema b/l.    NEUROLOGIC: Cranial nerves II through XII are intact. No focal Motor or sensory deficits b/l.   PSYCHIATRIC: The patient is alert and oriented x 3.  SKIN: No obvious rash, lesion, or ulcer.   LABORATORY PANEL:   CBC Recent Labs  Lab 09/10/17 0443  WBC 9.0  HGB 9.6*  HCT 30.0*  PLT 219   ------------------------------------------------------------------------------------------------------------------ Chemistries  Recent Labs  Lab 09/08/17 1417  09/10/17 0443  NA 137   < > 135  K 3.8   < > 3.6  CL 103   < > 103  CO2 23   < > 26  GLUCOSE 186*   < > 105*  BUN 29*   < > 15  CREATININE 1.18*   < > 0.97  CALCIUM 9.4   < > 8.5*  AST 27  --   --   ALT 15  --   --   ALKPHOS 68  --   --   BILITOT 0.4  --   --    < > = values in this interval not displayed.   ------------------------------------------------------------------------------------------------------------------  Cardiac Enzymes Recent Labs  Lab 09/08/17 1417  TROPONINI <0.03   ------------------------------------------------------------------------------------------------------------------  RADIOLOGY:  No results found.   ASSESSMENT AND PLAN:   MildredSharpeis a83 y.o.femalewith  a known history of acute on chronic Diastolic congestive heart failure, hypertension, cardiomyopathy, coronary artery disease, diabetes mellitus and hypertension and other medical problems is brought back to the emergency department after discharge earlier today for  uncontrolled blood pressure.  #Uncontrolled hypertension  Lisinopril dose increased.  On metoprolol.  Will add small dose of Norvasc.  Monitor overnight.  Discharge in the morning once blood pressure is stabilized.  Would prefer blood pressure to run on the higher side due to dizziness.  #Recurrent presyncope -Had a negative CT and MRI of the head on previous admission  TSH normal Orthostatic vitals with increasing heart rate with stable blood pressure.  #Diabetes mellitus -Continue home medications  #Hypothyroidism TSH normal  All the records are reviewed and case discussed with Care Management/Social Worker Management plans discussed with the patient, family and they are in agreement.  CODE STATUS: FULL CODE  DVT Prophylaxis: SCDs  TOTAL TIME TAKING CARE OF THIS PATIENT: 30 minutes.   POSSIBLE D/C IN 1-2 DAYS, DEPENDING ON CLINICAL CONDITION.  [name] M.D on 09/11/2017 at 5:52 PM  Between 7am to 6pm - Pager - [phone]  After 6pm go to [email or web address] - password [email or web address] [facility]  [facility] [place] [facility]  Office  [phone]  CC: Primary care physician; Marguarite ArbourSparks, Jeffrey D, MD  Note: This dictation was prepared with Dragon dictation along with smaller phrase technology. Any transcriptional errors that result from this process are unintentional.

## 2017-09-11 NOTE — Care Management Obs Status (Signed)
MEDICARE OBSERVATION STATUS NOTIFICATION   Patient Details  Name: Melinda Lewis MRN: [id number] Date of Birth: 05-01-1918   Medicare Observation Status Notification Given:  Yes  Notice signed, one given to patient and the other to HIM for scanning.  Notice given around 10:22 this morning.  Note did not save crrectly  Eber Hong, RN 09/11/2017, 4:36 PM

## 2017-09-11 NOTE — Progress Notes (Signed)
[facility] - [place] at [facility]   PATIENT NAME: Melinda GoodnessMildred Lewis    MR#:  [id number]  DATE OF BIRTH:  [birth year]  SUBJECTIVE:  CHIEF COMPLAINT:   Chief Complaint  Patient presents with  . Hypertension   Blood pressure still fluctuating. No focal weakness. No dizziness or tinnitus.  No change in hearing.  No focal deficits.  Patient gets lightheaded only when she sits or stands.  REVIEW OF SYSTEMS:    Review of Systems  Constitutional: Positive for malaise/fatigue. Negative for chills and fever.  HENT: Negative for sore throat.   Eyes: Negative for blurred vision, double vision and pain.  Respiratory: Negative for cough, hemoptysis, shortness of breath and wheezing.   Cardiovascular: Negative for chest pain, palpitations, orthopnea and leg swelling.  Gastrointestinal: Negative for abdominal pain, constipation, diarrhea, heartburn, nausea and vomiting.  Genitourinary: Negative for dysuria and hematuria.  Musculoskeletal: Negative for back pain and joint pain.  Skin: Negative for rash.  Neurological: Positive for dizziness. Negative for sensory change, speech change, focal weakness and headaches.  Endo/Heme/Allergies: Does not bruise/bleed easily.  Psychiatric/Behavioral: Negative for depression. The patient is not nervous/anxious.     DRUG ALLERGIES:   Allergies  Allergen Reactions  . Cefuroxime Axetil Nausea Only    cramps  . Clonidine Derivatives Other (See Comments)    Maker her pass out  . Epinephrine   . Penicillins   . Septra [Sulfamethoxazole-Trimethoprim] Nausea And Vomiting    VITALS:  Blood pressure (!) 211/69, pulse 74, temperature 98.1 F (36.7 C), temperature source Oral, resp. rate 14, height 5\' 2"  (1.575 m), weight 71.4 kg (157 lb 8 oz), SpO2 94 %.  PHYSICAL EXAMINATION:   Physical Exam  GENERAL:  83 y.o.-year-old patient lying in the bed with no acute distress.  EYES: Pupils equal, round, reactive to light and accommodation. No  scleral icterus. Extraocular muscles intact.  HEENT: Head atraumatic, normocephalic. Oropharynx and nasopharynx clear.  NECK:  Supple, no jugular venous distention. No thyroid enlargement, no tenderness.  LUNGS: Normal breath sounds bilaterally, no wheezing, rales, rhonchi. No use of accessory muscles of respiration.  CARDIOVASCULAR: S1, S2 normal. No murmurs, rubs, or gallops.  ABDOMEN: Soft, nontender, nondistended. Bowel sounds present. No organomegaly or mass.  EXTREMITIES: No cyanosis, clubbing or edema b/l.    NEUROLOGIC: Cranial nerves II through XII are intact. No focal Motor or sensory deficits b/l.   PSYCHIATRIC: The patient is alert and oriented x 3.  SKIN: No obvious rash, lesion, or ulcer.   LABORATORY PANEL:   CBC Recent Labs  Lab 09/10/17 0443  WBC 9.0  HGB 9.6*  HCT 30.0*  PLT 219   ------------------------------------------------------------------------------------------------------------------ Chemistries  Recent Labs  Lab 09/08/17 1417  09/10/17 0443  NA 137   < > 135  K 3.8   < > 3.6  CL 103   < > 103  CO2 23   < > 26  GLUCOSE 186*   < > 105*  BUN 29*   < > 15  CREATININE 1.18*   < > 0.97  CALCIUM 9.4   < > 8.5*  AST 27  --   --   ALT 15  --   --   ALKPHOS 68  --   --   BILITOT 0.4  --   --    < > = values in this interval not displayed.   ------------------------------------------------------------------------------------------------------------------  Cardiac Enzymes Recent Labs  Lab 09/08/17 1417  TROPONINI <0.03   ------------------------------------------------------------------------------------------------------------------  RADIOLOGY:  No results found.   ASSESSMENT AND PLAN:   MildredSharpeis a89 y.o.femalewith a known history of acute on chronic Diastolic congestive heart failure, hypertension, cardiomyopathy, coronary artery disease, diabetes mellitus and hypertension and other medical problems is brought back to the  emergency department after discharge earlier today for uncontrolled blood pressure.  # Orthostatic lightheadedness No spinning sensation or tinnitus.  No nystagmus. Patient's lightheadedness stabilized when I had her sit for more than 20 seconds although symptoms were present initially.  Appreciate cardiology input.  Will place TED hose.  Physical therapy evaluated patient and suggested sniff.  This is likely a chronic problem from now.  Counseled patient to take time from laying to sitting and to stand.  No episodes of presyncope or syncope in the hospital.  Discharge home with home health or to significant in the morning.   #Uncontrolled hypertension  Lisinopril dose increased.  On metoprolol.  Added Norvasc.  Blood pressure continues to be high.  Will increase dose on Norvasc. Will need to be cautious with dropping the blood pressure due to patient being symptomatic in spite of blood pressure staying high.  #Diabetes mellitus -Continue home medications  #Hypothyroidism TSH normal  All the records are reviewed and case discussed with Care Management/Social Worker Management plans discussed with the patient, family and they are in agreement.  CODE STATUS: FULL CODE  DVT Prophylaxis: SCDs  TOTAL TIME TAKING CARE OF THIS PATIENT: 30 minutes.   POSSIBLE D/C IN 1-2 DAYS, DEPENDING ON CLINICAL CONDITION.  Melinda Lewis Melinda Lewis M.D on 09/11/2017 at 5:56 PM  Between 7am to 6pm - Pager - [phone]  After 6pm go to [email or web address] - password [email or web address] [facility]  [facility] [place] [facility]  Office  [phone]  CC: Primary care physician; Melinda Arbour, MD  Note: This dictation was prepared with Dragon dictation along with smaller phrase technology. Any transcriptional errors that result from this process are unintentional.

## 2017-09-11 NOTE — Care Management (Signed)
CM spoke with physical therapy and informed that have recommended skilled nursing facility placement because patient could not even stand during therapy session due to dizziness.  Informed that patient is not  orthostatic.  Patient and her family are adamant that patient is not dizzy.  Says when she sits up or moves "I feel like I am going to pass out." Says that her legs and arms feel weak "like they can't move."  MRI is negative. Family discussing the physical therapy recommendation of skilled nursing placement.  CM discussed that skilled nursing facility placement would not be of much benefit if patient can not not participate with therapy due to syncope sx.   Cardiology had documented if patient's sx continue may benefit from neuro consult. Reached out to attending.

## 2017-09-11 NOTE — Care Management (Addendum)
[facility] will accept patient's medicare UHC policy for SN and PT services

## 2017-09-11 NOTE — Care Management (Signed)
Placed in observation for elevated blood pressure with 24 hours of discharge for the same.  Currently is not receiving any services in the home. PT consult is pending.  Prior to 12/23- patient independent in all her adls and living alone.  At present there is concern that "patient can not even stand, much less walk".  Open to consider home health and no agency preference. Reaching out to agencies that may accept patient's insurance

## 2017-09-11 NOTE — Care Management Obs Status (Signed)
MEDICARE OBSERVATION STATUS NOTIFICATION   Patient Details  Name: ABBEE DOWDEN MRN: [id number] Date of Birth: 04/15/1918   Medicare Observation Status Notification Given:    Notice signed, one given to patient and the other to HIM for scanning    Eber Hong, RN 09/11/2017, 10:22 AM

## 2017-09-11 NOTE — Progress Notes (Signed)
Progress Note  Patient Name: Melinda MillersMildred P Rovira Date of Encounter: 09/11/2017  Primary Cardiologist: [place]  Subjective   No acute overnight events. Continues to note positional dizziness this morning when sitting up from a laying position. Family asking about "blocked arteries" and "PET scans."   Inpatient Medications    Scheduled Meds: . amLODipine  2.5 mg Oral Daily  . aspirin EC  81 mg Oral Daily  . docusate sodium  100 mg Oral BID  . furosemide  20 mg Oral Daily  . heparin  5,000 Units Subcutaneous Q8H  . levothyroxine  100 mcg Oral QAC breakfast  . lisinopril  20 mg Oral Daily  . magnesium oxide  400 mg Oral BID  . metFORMIN  500 mg Oral BID WC  . metoprolol succinate  25 mg Oral Daily  . pantoprazole  40 mg Oral Daily  . QUEtiapine  25 mg Oral QHS   Continuous Infusions:  PRN Meds: acetaminophen **OR** acetaminophen, bisacodyl, HYDROcodone-acetaminophen, metoprolol tartrate, ondansetron **OR** ondansetron (ZOFRAN) IV, traZODone   Vital Signs    Vitals:   09/11/17 0000 09/11/17 0540 09/11/17 0541 09/11/17 0820  BP: (!) 171/54 (!) 185/63 (!) 171/41 (!) 159/44  Pulse:  72 62 67  Resp:  18  14  Temp:  98.1 F (36.7 C)    TempSrc:  Oral    SpO2:  94% 94% 94%  Weight:   157 lb 8 oz (71.4 kg)   Height:        Intake/Output Summary (Last 24 hours) at 09/11/2017 0831 Last data filed at 09/11/2017 0601 Gross per 24 hour  Intake -  Output 2650 ml  Net -2650 ml   Filed Weights   09/10/17 0000 09/10/17 0444 09/11/17 0541  Weight: 156 lb 4.8 oz (70.9 kg) 157 lb 12.8 oz (71.6 kg) 157 lb 8 oz (71.4 kg)    Telemetry    NSR - Personally Reviewed  ECG    NSR, 68 bpm, no acute st/t changes - Personally Reviewed  Physical Exam   GEN: No acute distress.   Neck: No JVD. Cardiac: RRR, no murmurs, rubs, or gallops.  Respiratory: Clear to auscultation bilaterally.  GI: Soft, nontender, non-distended.   MS: No edema; No deformity. Neuro:  Alert and oriented  x 3; Nonfocal.  Psych: Normal affect.  Labs    Chemistry Recent Labs  Lab 09/08/17 1417 09/09/17 0549 09/10/17 0443  NA 137 135 135  K 3.8 3.9 3.6  CL 103 104 103  CO2 23 26 26   GLUCOSE 186* 106* 105*  BUN 29* 21* 15  CREATININE 1.18* 0.97 0.97  CALCIUM 9.4 8.4* 8.5*  PROT 7.4  --   --   ALBUMIN 3.9  --   --   AST 27  --   --   ALT 15  --   --   ALKPHOS 68  --   --   BILITOT 0.4  --   --   GFRNONAA 37* 47* 47*  GFRAA 43* 55* 55*  ANIONGAP 11 5 6      Hematology Recent Labs  Lab 09/08/17 1504 09/09/17 0549 09/10/17 0443  WBC 11.1* 9.9 9.0  RBC 4.02 3.46* 3.64*  HGB 10.7* 9.2* 9.6*  HCT 34.1* 28.6* 30.0*  MCV 84.8 82.8 82.6  MCH 26.5 26.6 26.4  MCHC 31.3* 32.2 32.0  RDW 16.0* 15.9* 16.0*  PLT 215 220 219    Cardiac Enzymes Recent Labs  Lab 09/08/17 1417  TROPONINI <0.03   No  results for input(s): TROPIPOC in the last 168 hours.   BNPNo results for input(s): BNP, PROBNP in the last 168 hours.   DDimer No results for input(s): DDIMER in the last 168 hours.   Radiology    Mr Laqueta Jean Wo Contrast  Result Date: 09/09/2017 IMPRESSION: No acute intracranial abnormality. Largely stable since 2011 and unremarkable for age MRI appearance of the brain. Electronically Signed   By: Odessa Fleming M.D.   On: 09/09/2017 13:37    Cardiac Studies   TTE 05/2016: Study Conclusions  - Left ventricle: The cavity size was normal. Wall thickness was   increased in a pattern of mild LVH. Systolic function was normal.   The estimated ejection fraction was in the range of 60% to 65%.   Wall motion was normal; there were no regional wall motion   abnormalities. Doppler parameters are consistent with abnormal   left ventricular relaxation (grade 1 diastolic dysfunction). - Mitral valve: Calcified annulus. - Tricuspid valve: There was mild regurgitation. - Pulmonary arteries: Systolic pressure was mildly increased. PA   peak pressure: 39 mm Hg (S).  Impressions:  -  Compared to the prior study, there has been no significant   interval change.  Patient Profile     83 y.o. female with history of coronary artery disease status post LAD/LCx stent placement in 2015, hypertension, hyperlipidemia and type 2 diabetes  who is being seen today for the evaluation of dizziness at the request of Dr. Sherryll Burger.  Assessment & Plan    1. Dizziness/presyncope: -No arrhythmias noted on telemetry -Consider outpatient cardiac monitoring -Echo as above -Likely in the setting of orthostasis -Consider PT evaluation   2. CAD: -No symptoms concerning for angina -Troponin negative  -Recent echo as above -No plans for inpatient ischemia evaluation  3. HTN: -Improving -May need more permissive BP given history consistent with orthostasis -Blood pressure fluctuates significantly  -Continue current medications  For questions or updates, please contact [facility] Please consult [email or web address] for contact info under Cardiology/STEMI.    Signed, Eula Listen, PA-C [facility] Pager: [phone] 09/11/2017, 8:31 AM

## 2017-09-11 NOTE — Progress Notes (Signed)
Physical Therapy Evaluation Patient Details Name: Melinda Lewis MRN: [id number] DOB: 1918/08/16 Today's Date: 09/11/2017   History of Present Illness  Melinda GoodnessMildred Lewis  is a 83 y.o. female with a known history of chronic Diastolic congestive heart failure, hypertension, cardiomyopathy, coronary artery disease, diabetes mellitus and hypertension and other medical problems is brought into the ED just discharged earlier today.  She states that she has had episodes where her arms go numb before her prior admission but not since she went home.  She has had negative CT scans and MRI of the head on yesterday's admission.  She is essentially asymptomatic with her elevated blood pressure.  Her family is concerned that her blood pressure fluctuating at home and would like her to get admitted as they do not know what else to do at home  Clinical Impression  Patient presents with reports of feeling dizzy. She needs mod assist for bed mobility and mod assist for sit to stand transfer with RW. She has poor sitting and poor standing balance and has dizziness that prevents her from getting to a chair or walking with RW. She has decreased strength BLE hips and will benefit from skilled PT to improve strength, improve mobility and safety.     Follow Up Recommendations SNF    Equipment Recommendations  Rolling walker with 5" wheels    Recommendations for Other Services       Precautions / Restrictions Precautions Precautions: Fall Restrictions Weight Bearing Restrictions: No      Mobility  Bed Mobility Overal bed mobility: Needs Assistance Bed Mobility: Supine to Sit(mod assist)     Supine to sit: Mod assist        Transfers Overall transfer level: Needs assistance Equipment used: Rolling walker (2 wheeled) Transfers: Sit to/from Stand(mod assist) Sit to Stand: Min assist         General transfer comment: (Patietn is dizzy and unablle to transfer to a  chair.)  Ambulation/Gait Ambulation/Gait assistance: (unable due to dizziness)              Stairs            Wheelchair Mobility    Modified Rankin (Stroke Patients Only)       Balance Overall balance assessment: Needs assistance Sitting-balance support: Bilateral upper extremity supported Sitting balance-Leahy Scale: Fair     Standing balance support: Bilateral upper extremity supported Standing balance-Leahy Scale: Poor                               Pertinent Vitals/Pain Pain Assessment: No/denies pain    Home Living Family/patient expects to be discharged to:: Private residence Living Arrangements: Alone Available Help at Discharge: Family Type of Home: House Home Access: Level entry     Home Layout: One level Home Equipment: Environmental consultantWalker - 4 wheels      Prior Function Level of Independence: Independent with assistive device(s)               Hand Dominance        Extremity/Trunk Assessment   Upper Extremity Assessment Upper Extremity Assessment: Overall WFL for tasks assessed    Lower Extremity Assessment Lower Extremity Assessment: Generalized weakness(B hips 3/5 abd/flex, 2/5 add, b knees )       Communication   Communication: No difficulties  Cognition Arousal/Alertness: Awake/alert Behavior During Therapy: WFL for tasks assessed/performed Overall Cognitive Status: Within Functional Limits for tasks assessed  General Comments      Exercises     Assessment/Plan    PT Assessment Patient needs continued PT services  PT Problem List Decreased strength;Decreased activity tolerance;Decreased balance;Decreased mobility       PT Treatment Interventions Gait training;Balance training;Therapeutic exercise;Therapeutic activities    PT Goals (Current goals can be found in the Care Plan section)  Acute Rehab PT Goals Patient Stated Goal: to walk PT Goal  Formulation: With patient Time For Goal Achievement: 09/25/17 Potential to Achieve Goals: Good    Frequency Min 2X/week   Barriers to discharge Decreased caregiver support      Co-evaluation               AM-PAC PT "6 Clicks" Daily Activity  Outcome Measure Difficulty turning over in bed (including adjusting bedclothes, sheets and blankets)?: A Little Difficulty moving from lying on back to sitting on the side of the bed? : A Little Difficulty sitting down on and standing up from a chair with arms (e.g., wheelchair, bedside commode, etc,.)?: A Lot Help needed moving to and from a bed to chair (including a wheelchair)?: Total Help needed walking in hospital room?: Total Help needed climbing 3-5 steps with a railing? : Total 6 Click Score: 11    End of Session Equipment Utilized During Treatment: Gait belt Activity Tolerance: Treatment limited secondary to medical complications (Comment)(dizziness) Patient left: in bed;with call bell/phone within reach;with bed alarm set   PT Visit Diagnosis: Unsteadiness on feet (R26.81);Difficulty in walking, not elsewhere classified (R26.2)    Time: 9518-8416 PT Time Calculation (min) (ACUTE ONLY): 25 min   Charges:   PT Evaluation $PT Eval Low Complexity: 1 Low PT Treatments $Therapeutic Activity: 8-22 mins   PT G Codes:   PT G-Codes **NOT FOR INPATIENT CLASS** Functional Assessment Tool Used: AM-PAC 6 Clicks Basic Mobility Functional Limitation: Mobility: Walking and moving around Mobility: Walking and Moving Around Current Status (S0630): 100 percent impaired, limited or restricted Mobility: Walking and Moving Around Goal Status (Z6010): At least 80 percent but less than 100 percent impaired, limited or restricted    Ezekiel Ina, PT DPT 09/11/2017, 4:00 PM

## 2017-09-11 NOTE — Progress Notes (Signed)
This morning she told me she had to use a bed pan because she couldn't tolerate sitting up.  Just now she requested to use the bedside commode.  Got up to [facility] with very little assistance. No c/o nausea nor feeling like she would pass out.

## 2017-09-12 LAB — BASIC METABOLIC PANEL
Anion gap: 6 (ref 5–15)
BUN: 22 mg/dL — AB (ref 6–20)
CALCIUM: 9.1 mg/dL (ref 8.9–10.3)
CHLORIDE: 99 mmol/L — AB (ref 101–111)
CO2: 29 mmol/L (ref 22–32)
CREATININE: 1.13 mg/dL — AB (ref 0.44–1.00)
GFR, EST AFRICAN AMERICAN: 46 mL/min — AB (ref >60)
GFR, EST NON AFRICAN AMERICAN: 39 mL/min — AB (ref >60)
Glucose, Bld: 110 mg/dL — ABNORMAL HIGH (ref 65–99)
Potassium: 4.3 mmol/L (ref 3.5–5.1)
SODIUM: 134 mmol/L — AB (ref 135–145)

## 2017-09-12 LAB — CORTISOL: CORTISOL PLASMA: 11.6 ug/dL

## 2017-09-12 LAB — GLUCOSE, CAPILLARY: Glucose-Capillary: 106 mg/dL — ABNORMAL HIGH (ref 65–99)

## 2017-09-12 MED ORDER — LISINOPRIL 20 MG PO TABS
40.0000 mg | ORAL_TABLET | Freq: Every day | ORAL | Status: DC
  Administered 2017-09-12: 40 mg via ORAL
  Filled 2017-09-12: qty 2

## 2017-09-12 MED ORDER — LISINOPRIL 40 MG PO TABS: 40.0000 mg | ORAL_TABLET | Freq: Every day | ORAL | 0 refills | Status: DC

## 2017-09-12 MED ORDER — MECLIZINE HCL 12.5 MG PO TABS
12.5000 mg | ORAL_TABLET | Freq: Three times a day (TID) | ORAL | Status: DC | PRN
  Filled 2017-09-12: qty 1

## 2017-09-12 MED ORDER — MECLIZINE HCL 12.5 MG PO TABS: 12.5000 mg | ORAL_TABLET | Freq: Three times a day (TID) | ORAL | 0 refills | Status: DC | PRN

## 2017-09-12 MED ORDER — HYDRALAZINE HCL 50 MG PO TABS: 50.0000 mg | ORAL_TABLET | Freq: Every day | ORAL | 0 refills | Status: DC | PRN

## 2017-09-12 MED ORDER — SODIUM CHLORIDE 0.9 % IV BOLUS (SEPSIS)
500.0000 mL | Freq: Once | INTRAVENOUS | Status: AC
  Administered 2017-09-12: 500 mL via INTRAVENOUS

## 2017-09-12 MED ORDER — AMLODIPINE BESYLATE 10 MG PO TABS
10.0000 mg | ORAL_TABLET | Freq: Every day | ORAL | Status: DC
  Administered 2017-09-12: 10 mg via ORAL
  Filled 2017-09-12: qty 1

## 2017-09-12 MED ORDER — AMLODIPINE BESYLATE 10 MG PO TABS: 10.0000 mg | ORAL_TABLET | Freq: Every day | ORAL | 0 refills | Status: DC

## 2017-09-12 NOTE — Clinical Social Work Note (Signed)
CSW received referral for SNF.  Case discussed with case manager and plan is to discharge home with home health.  CSW to sign off please re-consult if social work needs arise.  Jamirah Zelaya R. Hakim Minniefield, MSW, LCSWA [phone]  

## 2017-09-12 NOTE — Care Management (Signed)
Patient was able to participate with physical therapy today. Received message from [facility] stating that  agency has a referral for this patient.   Family said a friend had come by patient's room and left a card.  During the conversation, there was mention of going to [facility]. Family does not have preference.  CM notified [facility] who accepted the referral yesterday and informed that patient can be seen 12/28.  Obtained order for SN PT Aide. Daughter Darlina Guys will be primary contact.  Patient does not require a walker

## 2017-09-12 NOTE — Progress Notes (Signed)
Discharge instructions explained to pt and pts family/ verbalized an understanding/ IV and tele removed/ will transport off unit via wheelchair.

## 2017-09-12 NOTE — Progress Notes (Signed)
Physical Therapy Treatment Patient Details Name: Melinda MillersMildred P Lewis MRN: [id number] DOB: [birth year] Today's Date: 09/12/2017    History of Present Illness Alcide GoodnessMildred Quilter  is a 83 y.o. female with a known history of chronic Diastolic congestive heart failure, hypertension, cardiomyopathy, coronary artery disease, diabetes mellitus and hypertension and other medical problems is brought into the ED just discharged earlier today.  She states that she has had episodes where her arms go numb before her prior admission but not since she went home.  She has had negative CT scans and MRI of the head on yesterday's admission.  She is essentially asymptomatic with her elevated blood pressure.  Her family is concerned that her blood pressure fluctuating at home and would like her to get admitted as they do not know what else to do at home    PT Comments    Pt in bed requesting to use bedside commode.  Stated she does not use RW to transfer.  Bed mobility to left with rail and increased time.  She was able to stand and transfer to/from commode to void with min guard.  Overall poor hand placements and unsteadiness of commode as she used it for UE weight bearing.  Discussed general safety with transfer.  She quickly returned to supine upon sitting in on edge of bed.  Encouraged further mobility and pt agreed to sit in chair for lunch.  Up on right side of bed with some increased difficulty but she was able to get to edge of bed with increased time and encouragement.  Pt was given time to sit on edge of bed due to voicing that her head felt "funny" and she described it as men running around in her head.  No nystagmus noted.  She was able to stand with walker with min guard and heavy verbal cues for hand placements but still pulls up on walker despite cues.  She was given time in standing then was able to ambulate 5' to chair with walker and min guard/assist.  No LOB or buckling noted but she did have a slow cautious gait.   Upon turning to chair she sat without reaching back despite verbal cues to do so.  She was fatigued with activity but remained up in chair with feet down to await lunch per her request.  Chair alarm on and family in room.     Follow Up Recommendations  SNF     Equipment Recommendations  Rolling walker with 5" wheels    Recommendations for Other Services       Precautions / Restrictions Precautions Precautions: Fall Restrictions Weight Bearing Restrictions: No    Mobility  Bed Mobility   Bed Mobility: Supine to Sit;Sit to Supine     Supine to sit: Min guard;Supervision Sit to supine: Min guard;Min assist   General bed mobility comments: increased time but no physical assist with rail.  R side of bed more difficult than L for pt.  Transfers Overall transfer level: Needs assistance Equipment used: Rolling walker (2 wheeled) Transfers: Sit to/from Stand              Ambulation/Gait Ambulation/Gait assistance: Min assist Ambulation Distance (Feet): 5 Feet Assistive device: Rolling walker (2 wheeled) Gait Pattern/deviations: Step-through pattern;Decreased step length - right;Decreased step length - left   Gait velocity interpretation: <1.8 ft/sec, indicative of risk for recurrent falls General Gait Details: no buckling or LOB but fatigued easily with "funny" feeling in her head which she describes as "men running around  up there"   Stairs            Wheelchair Mobility    Modified Rankin (Stroke Patients Only)       Balance Overall balance assessment: Needs assistance Sitting-balance support: Feet supported Sitting balance-Leahy Scale: Fair     Standing balance support: Bilateral upper extremity supported Standing balance-Leahy Scale: Poor Standing balance comment: no LOB or bucking but voices concerns about "funny" feeling in her head                            Cognition Arousal/Alertness: Awake/alert Behavior During Therapy: WFL for  tasks assessed/performed Overall Cognitive Status: Within Functional Limits for tasks assessed                                        Exercises Other Exercises Other Exercises: transfer to commode at bedside to void without AD per her request Other Exercises: bed mobility to left and right side of bed.    General Comments        Pertinent Vitals/Pain Pain Assessment: No/denies pain    Home Living                      Prior Function            PT Goals (current goals can now be found in the care plan section) Progress towards PT goals: Progressing toward goals    Frequency    Min 2X/week      PT Plan Current plan remains appropriate    Co-evaluation              AM-PAC PT "6 Clicks" Daily Activity  Outcome Measure  Difficulty turning over in bed (including adjusting bedclothes, sheets and blankets)?: A Little Difficulty moving from lying on back to sitting on the side of the bed? : A Little Difficulty sitting down on and standing up from a chair with arms (e.g., wheelchair, bedside commode, etc,.)?: A Little Help needed moving to and from a bed to chair (including a wheelchair)?: A Little Help needed walking in hospital room?: A Little Help needed climbing 3-5 steps with a railing? : A Lot 6 Click Score: 17    End of Session Equipment Utilized During Treatment: Gait belt Activity Tolerance: Patient tolerated treatment well;Treatment limited secondary to medical complications (Comment) Patient left: in chair;with chair alarm set;with call bell/phone within reach;with family/visitor present         Time: 1125-1201 PT Time Calculation (min) (ACUTE ONLY): 36 min  Charges:  $Gait Training: 8-22 mins $Therapeutic Exercise: 8-22 mins                    G Codes:      Danielle Dess, PTA 09/12/17, 12:15 PM

## 2017-09-16 NOTE — Clinical Social Work Note (Signed)
CSW was informed from [facility], that patient's family would like to go to SNF from home.  CSW completed FL2 and [place] number and it was faxed to [facility] at [phone].  Ervin Knack. Addysyn Fern, MSW, Theresia Majors [phone]  09/16/2017 12:16 PM

## 2017-09-16 NOTE — Care Management (Signed)
Notified that family is now requesting skilled nursing placement and [facility] will be able to accept patient.  Spoke with Elnita Maxwell with [facility] and updated that [facility] is able to accept patient.  Elnita Maxwell will speak with [facility] admission staff. CSW to complete FL2

## 2017-09-16 NOTE — NC FL2 (Signed)
[place] MEDICAID FL2 LEVEL OF CARE SCREENING TOOL     IDENTIFICATION  Patient Name: KLA MCCLARREN Birthdate: [birth year] Sex: female Admission Date (Current Location): 09/09/2017  [facility] and [place] Number:  Chiropodist and Address:  [facility], [address], [place], [place] [address]      Provider Number: [id number]  Attending Physician Name and Address:  Delfino Lovett, MD  Relative Name and Phone Number:       Current Level of Care: Home Recommended Level of Care: Skilled Nursing Facility Prior Approval Number:    Date Approved/Denied:   PASRR Number:    Discharge Plan: SNF    Current Diagnoses: Patient Active Problem List   Diagnosis Date Noted  . Uncontrolled hypertension 09/09/2017  . Nausea & vomiting 09/08/2017  . Chronic systolic CHF (congestive heart failure) (HCC) 03/04/2015  . Acute on chronic systolic CHF (congestive heart failure) (HCC) 06/16/2014  . Cardiomyopathy, ischemic 06/16/2014  . CAD, multiple vessel 06/15/2014  . Anemia 05/05/2014  . Hypothyroidism 05/05/2014  . 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/05/2014  . NSTEMI (non-ST elevated myocardial infarction) (HCC) 05/02/2014  . Hypertension 05/02/2014  . Pulmonary edema cardiac cause (HCC) 05/02/2014    Orientation RESPIRATION BLADDER Height & Weight     Self  Normal Incontinent Weight: 152 lb 8 oz (69.2 kg) Height:  5\' 2"  (157.5 cm)  BEHAVIORAL SYMPTOMS/MOOD NEUROLOGICAL BOWEL NUTRITION STATUS      Continent Diet(Low Sodium Heart Healthy)  AMBULATORY STATUS COMMUNICATION OF NEEDS Skin   Limited Assist Verbally Normal                       Personal Care Assistance Level of Assistance  Bathing, Feeding, Dressing Bathing Assistance: Limited assistance Feeding assistance: Independent Dressing Assistance: Limited assistance     Functional Limitations Info  Sight, Hearing, Speech Sight Info: Adequate Hearing Info: Adequate Speech Info:  Adequate    SPECIAL CARE FACTORS FREQUENCY  PT (By licensed PT)     PT Frequency: 5x a week              Contractures Contractures Info: Not present    Additional Factors Info  Code Status Code Status Info: Full Code             Current Medications (09/16/2017):  This is the current hospital active medication list No current facility-administered medications for this encounter.    Current Outpatient Medications  Medication Sig Dispense Refill  . aspirin EC 81 MG tablet Take 81 mg by mouth daily.    Marland Kitchen atorvastatin (LIPITOR) 40 MG tablet TAKE 1 TABLET BY MOUTH DAILY AT 6PM 90 tablet 2  . Flaxseed, Linseed, (FLAX SEED OIL PO) Take 1 tablet by mouth daily.    Marland Kitchen L-Arginine 1000 MG TABS Take 1 tablet by mouth 3 (three) times daily.    Marland Kitchen levothyroxine (SYNTHROID, LEVOTHROID) 100 MCG tablet TAKE 1 TABLET EVERY DAY ON EMPTY STOMACHWITH A GLASS OF WATER AT LEAST 30-60 MINBEFORE BREAKFAST    . magnesium oxide (MAG-OX) 400 MG tablet Take 400 mg by mouth 2 (two) times daily.    . metFORMIN (GLUCOPHAGE) 500 MG tablet Take 500 mg by mouth 2 (two) times daily with a meal.    . metoprolol succinate (TOPROL-XL) 25 MG 24 hr tablet TAKE 1 TABLET BY MOUTH EVERY DAY 90 tablet 3  . pantoprazole (PROTONIX) 40 MG tablet Take 40 mg by mouth daily.    . QUEtiapine (SEROQUEL) 25  MG tablet Take 1 tablet by mouth at bedtime.    Marland Kitchen. amLODipine (NORVASC) 10 MG tablet Take 1 tablet (10 mg total) by mouth daily. 30 tablet 0  . hydrALAZINE (APRESOLINE) 50 MG tablet Take 1 tablet (50 mg total) by mouth daily as needed (For Systolic BP>200). 30 tablet 0  . lisinopril (PRINIVIL,ZESTRIL) 40 MG tablet Take 1 tablet (40 mg total) by mouth daily. 30 tablet 0  . meclizine (ANTIVERT) 12.5 MG tablet Take 1 tablet (12.5 mg total) by mouth 3 (three) times daily as needed for dizziness. 15 tablet 0     Discharge Medications: Please see discharge summary for a list of discharge medications.  Relevant Imaging  Results:  Relevant Lab Results:   Additional Information SSN [id number]  Darleene Cleavernterhaus, Jacobus Colvin R, [place]WA

## 2017-09-26 NOTE — Discharge Summary (Signed)
[facility] [place] [facility]   PATIENT NAME: Melinda Lewis    MR#:  [id number]  DATE OF BIRTH:  1917/12/27  DATE OF ADMISSION:  09/09/2017 ADMITTING PHYSICIAN: Delfino Lovett, MD  DATE OF DISCHARGE: 09/12/2017  5:00 PM  PRIMARY CARE PHYSICIAN: Marguarite Arbour, MD   ADMISSION DIAGNOSIS:  Hypertension, unspecified type [I10]  DISCHARGE DIAGNOSIS:  Active Problems:   Uncontrolled hypertension   SECONDARY DIAGNOSIS:   Past Medical History:  Diagnosis Date  . Acute on chronic systolic CHF (congestive heart failure) (HCC) 06/16/2014  . Anemia   . CAD (coronary artery disease) 8/15   s/p DES LCX and LAD   . Cardiomyopathy, ischemic 06/16/2014  . Diabetes mellitus without complication (HCC)   . Hypertension   . NSTEMI (non-ST elevated myocardial infarction) (HCC)   . Thyroid disease   . TIA (transient ischemic attack) 2010     ADMITTING HISTORY  HISTORY OF PRESENT ILLNESS:  Melinda Lewis  is a 84 y.o. female with a known history of chronic Diastolic congestive heart failure, hypertension, cardiomyopathy, coronary artery disease, diabetes mellitus and hypertension and other medical problems is brought into the ED just discharged earlier today.  She states that she has had episodes where her arms go numb before her prior admission but not since she went home. She has had negative CT scans and MRI of the head on yesterday's admission. She is essentially asymptomatic with her elevated blood pressure.  Her family is concerned that her blood pressure fluctuating at home and would like her to get admitted as they do not know what else to do at home.  HOSPITAL COURSE:    MildredSharpeis a89 y.o.femalewith a known history of acute on chronicDiastolic congestive heart failure, hypertension, cardiomyopathy, coronary artery disease, diabetes mellitus and hypertension and other medical problems is broughtback to the emergency department after discharge earlier  today for uncontrolled blood pressure.  # Orthostatic lightheadedness No spinning sensation or tinnitus.  No nystagmus. Patient's lightheadedness stabilized when I had her sit for more than 20 seconds although symptoms were present initially.  Appreciate cardiology input.  Placed TED hose.  Physical therapy evaluated patient and suggested snf.  This is likely a chronic problem from now.  Counseled patient to take time from laying to sitting and to stand.  No episodes of presyncope or syncope in the hospital.  Discharge home with home health.  #Uncontrolledhypertension  Lisnopril dose increased. On Metoprolol. Added norvasc.  Will need to be cautious with dropping the blood pressure due to patient being symptomatic in spite of blood pressure staying high. Bp much improved with SBP 160s maximum  #Diabetes mellitus -Continue home medications  #Hypothyroidism TSH normal  Patient and family refused SNF and wanted home health. Discharged home.  CONSULTS OBTAINED:    DRUG ALLERGIES:   Allergies  Allergen Reactions  . Cefuroxime Axetil Nausea Only    cramps  . Clonidine Derivatives Other (See Comments)    Maker her pass out  . Epinephrine   . Penicillins   . Septra [Sulfamethoxazole-Trimethoprim] Nausea And Vomiting    DISCHARGE MEDICATIONS:   Allergies as of 09/12/2017      Reactions   Cefuroxime Axetil Nausea Only   cramps   Clonidine Derivatives Other (See Comments)   Maker her pass out   Epinephrine    Penicillins    Septra [sulfamethoxazole-trimethoprim] Nausea And Vomiting      Medication List    STOP taking these medications   furosemide 20 MG  tablet Commonly known as:  LASIX     TAKE these medications   amLODipine 10 MG tablet Commonly known as:  NORVASC Take 1 tablet (10 mg total) by mouth daily.   aspirin EC 81 MG tablet Take 81 mg by mouth daily.   atorvastatin 40 MG tablet Commonly known as:  LIPITOR TAKE 1 TABLET BY MOUTH DAILY AT 6PM    FLAX SEED OIL PO Take 1 tablet by mouth daily.   hydrALAZINE 50 MG tablet Commonly known as:  APRESOLINE Take 1 tablet (50 mg total) by mouth daily as needed (For Systolic BP>200).   L-Arginine 1000 MG Tabs Take 1 tablet by mouth 3 (three) times daily.   levothyroxine 100 MCG tablet Commonly known as:  SYNTHROID, LEVOTHROID TAKE 1 TABLET EVERY DAY ON EMPTY STOMACHWITH A GLASS OF WATER AT LEAST 30-60 MINBEFORE BREAKFAST   lisinopril 40 MG tablet Commonly known as:  PRINIVIL,ZESTRIL Take 1 tablet (40 mg total) by mouth daily. What changed:    medication strength  how much to take   magnesium oxide 400 MG tablet Commonly known as:  MAG-OX Take 400 mg by mouth 2 (two) times daily.   meclizine 12.5 MG tablet Commonly known as:  ANTIVERT Take 1 tablet (12.5 mg total) by mouth 3 (three) times daily as needed for dizziness.   metFORMIN 500 MG tablet Commonly known as:  GLUCOPHAGE Take 500 mg by mouth 2 (two) times daily with a meal.   metoprolol succinate 25 MG 24 hr tablet Commonly known as:  TOPROL-XL TAKE 1 TABLET BY MOUTH EVERY DAY   pantoprazole 40 MG tablet Commonly known as:  PROTONIX Take 40 mg by mouth daily.   QUEtiapine 25 MG tablet Commonly known as:  SEROQUEL Take 1 tablet by mouth at bedtime.       Today   VITAL SIGNS:  Blood pressure (!) 156/47, pulse 64, temperature 98.6 F (37 C), temperature source Oral, resp. rate 18, height 5\' 2"  (1.575 m), weight 69.2 kg (152 lb 8 oz), SpO2 92 %.  I/O:  No intake or output data in the 24 hours ending 09/26/17 2311  PHYSICAL EXAMINATION:  Physical Exam  GENERAL:  84 y.o.-year-old patient lying in the bed with no acute distress.  LUNGS: Normal breath sounds bilaterally, no wheezing, rales,rhonchi or crepitation. No use of accessory muscles of respiration.  CARDIOVASCULAR: S1, S2 normal. No murmurs, rubs, or gallops.  ABDOMEN: Soft, non-tender, non-distended. Bowel sounds present. No organomegaly or mass.   NEUROLOGIC: Moves all 4 extremities. PSYCHIATRIC: The patient is alert and oriented x 3.  SKIN: No obvious rash, lesion, or ulcer.   DATA REVIEW:   CBC No results for input(s): WBC, HGB, HCT, PLT in the last 168 hours.  Chemistries  No results for input(s): NA, K, CL, CO2, GLUCOSE, BUN, CREATININE, CALCIUM, MG, AST, ALT, ALKPHOS, BILITOT in the last 168 hours.  Invalid input(s): GFRCGP  Cardiac Enzymes No results for input(s): TROPONINI in the last 168 hours.  Microbiology Results  Results for orders placed or performed during the hospital encounter of 01/26/17  Urine culture     Status: None   Collection Time: 01/26/17  1:35 PM  Result Value Ref Range Status   Specimen Description URINE, RANDOM  Final   Special Requests NONE  Final   Culture   Final    NO GROWTH Performed at [facility], [address] [place], [place] [address]    Report Status 01/28/2017 FINAL  Final  RADIOLOGY:  No results found.  Follow up with PCP in 1 week.  Management plans discussed with the patient, family and they are in agreement.  CODE STATUS:  Code Status History    Date Active Date Inactive Code Status Order ID Comments User Context   09/09/2017 23:49 09/12/2017 20:28 Full Code [id number]  Delfino Lovett, MD Inpatient   09/08/2017 18:38 09/09/2017 17:18 Full Code [id number]  Ramonita Lab, MD Inpatient   05/04/2014 17:49 05/05/2014 15:24 Full Code [id number]  [place], Peter M, MD Inpatient   05/03/2014 17:51 05/04/2014 17:49 Full Code [id number]  [place], Peter M, MD Inpatient   05/02/2014 07:33 05/03/2014 17:51 Full Code [id number]  Anne Ng, MD Inpatient    Advance Directive Documentation     Most Recent Value  Type of Advance Directive  Healthcare Power of Attorney  Pre-existing out of facility DNR order (yellow form or pink MOST form)  No data  "MOST" Form in Place?  No data      TOTAL TIME TAKING CARE OF THIS PATIENT ON DAY OF DISCHARGE: more than 30 minutes.   Molinda Bailiff  Mccauley Diehl M.D on 09/26/2017 at 11:11 PM  Between 7am to 6pm - Pager - [phone]  After 6pm go to [email or web address] - password [email or web address] [facility]  [facility] [place] [facility]  Office  [phone]  CC: Primary care physician; Marguarite Arbour, MD  Note: This dictation was prepared with Dragon dictation along with smaller phrase technology. Any transcriptional errors that result from this process are unintentional.

## 2017-09-27 ENCOUNTER — Telehealth: Payer: Self-pay | Admitting: Cardiology

## 2017-09-27 NOTE — Telephone Encounter (Signed)
Returned call to patient's daughter Dorann Ou.She wanted to verify if mother is suppose to take Plavix and Amlodipine.After reviewing chart Plavix was stopped due to increased bruising.Advised Amlodipine not on her medication list.Stated mother will not take Amlodipine due to nausea.Advised I will make Dr.Jordan aware.

## 2017-09-27 NOTE — Telephone Encounter (Signed)
New message   Please call  Pt c/o medication issue:  1. Name of Medication: Plavix  2. How are you currently taking this medication (dosage and times per day)? n/a  3. Are you having a reaction (difficulty breathing--STAT)? NO 4. What is your medication issue? Patient is unsure if she should be taking Plavix, post hospital

## 2017-10-05 ENCOUNTER — Encounter: Payer: Self-pay | Admitting: Emergency Medicine

## 2017-10-05 ENCOUNTER — Other Ambulatory Visit: Payer: Self-pay

## 2017-10-05 ENCOUNTER — Emergency Department
Admission: EM | Admit: 2017-10-05 | Discharge: 2017-10-05 | Disposition: A | Discharge status: Home / Self Care | Payer: Medicare Other | Attending: Emergency Medicine | Admitting: Emergency Medicine

## 2017-10-05 ENCOUNTER — Emergency Department: Payer: Medicare Other

## 2017-10-05 DIAGNOSIS — I5022 Chronic systolic (congestive) heart failure: Secondary | ICD-10-CM | POA: Insufficient documentation

## 2017-10-05 DIAGNOSIS — Z7982 Long term (current) use of aspirin: Secondary | ICD-10-CM | POA: Insufficient documentation

## 2017-10-05 DIAGNOSIS — E039 Hypothyroidism, unspecified: Secondary | ICD-10-CM | POA: Insufficient documentation

## 2017-10-05 DIAGNOSIS — I7090 Unspecified atherosclerosis: Secondary | ICD-10-CM

## 2017-10-05 DIAGNOSIS — Z87891 Personal history of nicotine dependence: Secondary | ICD-10-CM | POA: Diagnosis not present

## 2017-10-05 DIAGNOSIS — I110 Hypertensive heart disease with heart failure: Secondary | ICD-10-CM | POA: Diagnosis not present

## 2017-10-05 DIAGNOSIS — Z79899 Other long term (current) drug therapy: Secondary | ICD-10-CM | POA: Insufficient documentation

## 2017-10-05 DIAGNOSIS — I2510 Atherosclerotic heart disease of native coronary artery without angina pectoris: Secondary | ICD-10-CM | POA: Insufficient documentation

## 2017-10-05 DIAGNOSIS — Z7984 Long term (current) use of oral hypoglycemic drugs: Secondary | ICD-10-CM | POA: Diagnosis not present

## 2017-10-05 DIAGNOSIS — R109 Unspecified abdominal pain: Secondary | ICD-10-CM | POA: Diagnosis not present

## 2017-10-05 DIAGNOSIS — R197 Diarrhea, unspecified: Secondary | ICD-10-CM

## 2017-10-05 DIAGNOSIS — R079 Chest pain, unspecified: Secondary | ICD-10-CM | POA: Diagnosis present

## 2017-10-05 DIAGNOSIS — I252 Old myocardial infarction: Secondary | ICD-10-CM | POA: Diagnosis not present

## 2017-10-05 DIAGNOSIS — R1110 Vomiting, unspecified: Secondary | ICD-10-CM | POA: Diagnosis not present

## 2017-10-05 LAB — CBC
HCT: 34.2 % — ABNORMAL LOW (ref 35.0–47.0)
HEMOGLOBIN: 10.8 g/dL — AB (ref 12.0–16.0)
MCH: 26.3 pg (ref 26.0–34.0)
MCHC: 31.7 g/dL — AB (ref 32.0–36.0)
MCV: 83 fL (ref 80.0–100.0)
Platelets: 307 10*3/uL (ref 150–440)
RBC: 4.12 MIL/uL (ref 3.80–5.20)
RDW: 16.7 % — ABNORMAL HIGH (ref 11.5–14.5)
WBC: 17.9 10*3/uL — ABNORMAL HIGH (ref 3.6–11.0)

## 2017-10-05 LAB — URINALYSIS, COMPLETE (UACMP) WITH MICROSCOPIC
BACTERIA UA: NONE SEEN
Bilirubin Urine: NEGATIVE
Glucose, UA: 50 mg/dL — AB
Ketones, ur: NEGATIVE mg/dL
Leukocytes, UA: NEGATIVE
Nitrite: NEGATIVE
PH: 5 (ref 5.0–8.0)
Protein, ur: NEGATIVE mg/dL
SPECIFIC GRAVITY, URINE: 1.014 (ref 1.005–1.030)

## 2017-10-05 LAB — INFLUENZA PANEL BY PCR (TYPE A & B)
INFLBPCR: NEGATIVE
Influenza A By PCR: NEGATIVE

## 2017-10-05 LAB — COMPREHENSIVE METABOLIC PANEL
ALK PHOS: 53 U/L (ref 38–126)
ALT: 15 U/L (ref 14–54)
AST: 27 U/L (ref 15–41)
Albumin: 3.6 g/dL (ref 3.5–5.0)
Anion gap: 9 (ref 5–15)
BUN: 25 mg/dL — ABNORMAL HIGH (ref 6–20)
CALCIUM: 9.2 mg/dL (ref 8.9–10.3)
CO2: 22 mmol/L (ref 22–32)
Chloride: 104 mmol/L (ref 101–111)
Creatinine, Ser: 0.9 mg/dL (ref 0.44–1.00)
GFR calc Af Amer: >60 mL/min (ref >60)
GFR calc non Af Amer: 52 mL/min — ABNORMAL LOW (ref >60)
GLUCOSE: 180 mg/dL — AB (ref 65–99)
Potassium: 4.6 mmol/L (ref 3.5–5.1)
SODIUM: 135 mmol/L (ref 135–145)
Total Bilirubin: 0.6 mg/dL (ref 0.3–1.2)
Total Protein: 6.9 g/dL (ref 6.5–8.1)

## 2017-10-05 LAB — LACTIC ACID, PLASMA: LACTIC ACID, VENOUS: 1.7 mmol/L (ref 0.5–1.9)

## 2017-10-05 LAB — TROPONIN I: Troponin I: <0.03 ng/mL (ref <0.03)

## 2017-10-05 MED ORDER — SODIUM CHLORIDE 0.9 % IV BOLUS (SEPSIS)
500.0000 mL | Freq: Once | INTRAVENOUS | Status: AC
  Administered 2017-10-05: 500 mL via INTRAVENOUS

## 2017-10-05 MED ORDER — IOPAMIDOL (ISOVUE-300) INJECTION 61%
30.0000 mL | Freq: Once | INTRAVENOUS | Status: AC | PRN
  Administered 2017-10-05: 30 mL via ORAL

## 2017-10-05 MED ORDER — ONDANSETRON HCL 4 MG PO TABS: 4.0000 mg | ORAL_TABLET | Freq: Three times a day (TID) | ORAL | 0 refills | Status: DC | PRN

## 2017-10-05 MED ORDER — IOPAMIDOL (ISOVUE-300) INJECTION 61%
100.0000 mL | Freq: Once | INTRAVENOUS | Status: AC | PRN
  Administered 2017-10-05: 100 mL via INTRAVENOUS

## 2017-10-05 NOTE — Discharge Instructions (Signed)
You are evaluated for nausea and diarrhea and were evaluated here in the emergency department, and although no certain cause was found, your exam and evaluation are overall reassuring in the emergency department today.  As we discussed, I am most suspicious of a intestinal virus.  We discussed whether or not to stay overnight in the hospital or start antibiotics, and chose to hold off on this right now.  You may try over-the-counter Imodium, use as directed on label if you have symptoms.  Return to the emergency room immediately for any fever, black or bloody stool, abdominal pain, confusion, or concern for dehydration such as dry mouth or not urinating.

## 2017-10-05 NOTE — ED Triage Notes (Signed)
3 episodes of chest pain fleeting since 3 am.

## 2017-10-05 NOTE — ED Notes (Signed)

## 2017-10-05 NOTE — ED Provider Notes (Signed)
[facility] Emergency Department Provider Note ____________________________________________   I have reviewed the triage vital signs and the triage nursing note.  HISTORY  Chief Complaint Chest Pain   Historian Patient, as well as her son and daughter.  Son has been staying with her over the past 2 weeks.  HPI Melinda Lewis is a 84 y.o. female who has been living alone until about 2 weeks ago, son has been staying with her.  He is actually planning on going back home today, but overnight around 130 she woke up having 3 center or right-sided chest pains that she describes just came and went.  Not associated with any specific nausea, extension or radiation, or trouble breathing.  Later on throughout the night she developed nausea and diarrhea, nonbloody and nonbilious.  Denies recent antibiotic use.  Denies prior history of similar symptoms.  She still having some abdominal pain which is described as somewhat mild to moderate in the lower abdomen.  No ongoing chest pain.  No reported recent illness or fevers.  Around Christmas time she was seen for nausea and no certain cause was found, but she was hypertensive and so her metoprolol had been increased to twice daily.  Just a few days ago her metformin was decreased and to be given only at night secondary to a consideration that possibly the nausea could be due to the metformin -although she is been on it for years and never had a problem.   Past Medical History:  Diagnosis Date  . Acute on chronic systolic CHF (congestive heart failure) (HCC) 06/16/2014  . Anemia   . CAD (coronary artery disease) 8/15   s/p DES LCX and LAD   . Cardiomyopathy, ischemic 06/16/2014  . Diabetes mellitus without complication (HCC)   . Hypertension   . NSTEMI (non-ST elevated myocardial infarction) (HCC)   . Thyroid disease   . TIA (transient ischemic attack) 2010    Patient Active Problem List   Diagnosis Date Noted  .  Uncontrolled hypertension 09/09/2017  . Nausea & vomiting 09/08/2017  . Chronic systolic CHF (congestive heart failure) (HCC) 03/04/2015  . Acute on chronic systolic CHF (congestive heart failure) (HCC) 06/16/2014  . Cardiomyopathy, ischemic 06/16/2014  . CAD, multiple vessel 06/15/2014  . Anemia 05/05/2014  . Hypothyroidism 05/05/2014  . 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/05/2014  . NSTEMI (non-ST elevated myocardial infarction) (HCC) 05/02/2014  . Hypertension 05/02/2014  . Pulmonary edema cardiac cause (HCC) 05/02/2014    Past Surgical History:  Procedure Laterality Date  . LEFT HEART CATHETERIZATION WITH CORONARY ANGIOGRAM N/A 05/03/2014   Procedure: LEFT HEART CATHETERIZATION WITH CORONARY ANGIOGRAM;  Surgeon: Peter M Swaziland, MD;  Location: [facility] CATH LAB;  Service: Cardiovascular;  Laterality: N/A;  . PERCUTANEOUS CORONARY STENT INTERVENTION (PCI-S) Right 05/03/2014   Procedure: PERCUTANEOUS CORONARY STENT INTERVENTION (PCI-S);  Surgeon: Peter M Swaziland, MD;  Location: [facility] CATH LAB;  Service: Cardiovascular;  Laterality: Right;  . PERCUTANEOUS CORONARY STENT INTERVENTION (PCI-S) N/A 05/04/2014   Procedure: PERCUTANEOUS CORONARY STENT INTERVENTION (PCI-S);  Surgeon: Peter M Swaziland, MD;  Location: [facility] CATH LAB;  Service: Cardiovascular;  Laterality: N/A;    Prior to Admission medications   Medication Sig Start Date End Date Taking? Authorizing Provider  aspirin EC 81 MG tablet Take 81 mg by mouth daily.   Yes [provider]  atorvastatin (LIPITOR) 40 MG tablet TAKE 1 TABLET BY MOUTH DAILY AT 6PM 07/01/17  Yes Swaziland, Peter M, MD  Flaxseed, Linseed, (FLAX SEED  OIL PO) Take 1 tablet by mouth daily.   Yes [provider]  hydrALAZINE (APRESOLINE) 50 MG tablet Take 1 tablet (50 mg total) by mouth daily as needed (For Systolic BP>200). 09/12/17 09/12/18 Yes Sudini, Wardell Heath, MD  L-Arginine 1000 MG TABS Take 1 tablet by mouth 3 (three) times daily.   Yes [provider]  levothyroxine (SYNTHROID, LEVOTHROID) 100 MCG tablet TAKE 1 TABLET EVERY DAY ON EMPTY STOMACHWITH A GLASS OF WATER AT LEAST 30-60 MINBEFORE BREAKFAST 02/09/17  Yes [provider]  lisinopril (PRINIVIL,ZESTRIL) 40 MG tablet Take 1 tablet (40 mg total) by mouth daily. 09/13/17  Yes Sudini, Wardell Heath, MD  magnesium oxide (MAG-OX) 400 MG tablet Take 400 mg by mouth 2 (two) times daily.   Yes [provider]  meclizine (ANTIVERT) 12.5 MG tablet Take 1 tablet (12.5 mg total) by mouth 3 (three) times daily as needed for dizziness. 09/12/17  Yes Sudini, Wardell Heath, MD  metFORMIN (GLUCOPHAGE) 500 MG tablet Take 500 mg by mouth daily.    Yes [provider]  metoprolol succinate (TOPROL-XL) 25 MG 24 hr tablet TAKE 1 TABLET BY MOUTH EVERY DAY Patient taking differently: TAKE 1 TABLET BY MOUTH TWICE DAILY 03/15/17  Yes Swazil[place], Peter M, MD  pantoprazole (PROTONIX) 40 MG tablet Take 40 mg by mouth daily.   Yes [provider]  QUEtiapine (SEROQUEL) 25 MG tablet Take 1 tablet by mouth at bedtime. 12/17/16  Yes [provider]  amLODipine (NORVASC) 10 MG tablet Take 1 tablet (10 mg total) by mouth daily. Patient not taking: Reported on 10/05/2017 09/13/17   Milagros Loll, MD    Allergies  Allergen Reactions  . Cefuroxime Axetil Nausea Only    cramps  . Clonidine Derivatives Other (See Comments)    Maker her pass out  . Epinephrine   . Penicillins   . Septra [Sulfamethoxazole-Trimethoprim] Nausea And Vomiting    No family history on file.  Social History Social History   Tobacco Use  . Smoking status: Former Smoker    Types: Cigarettes  . Smokeless tobacco: Never Used  Substance Use Topics  . Alcohol use: No  . Drug use: No    Review of Systems  Constitutional: Negative for fever. Eyes: Negative for visual changes. ENT: Negative for sore throat. Cardiovascular: Some chest pain early in the morning as per HPI. Respiratory: Negative for  shortness of breath.  Occasional mild cough.  It is nonproductive. Gastrointestinal: Negative for vomiting, but abdominal pain and diarrhea are positive as per HPI Genitourinary: Negative for dysuria. Musculoskeletal: Negative for back pain. Skin: Negative for rash. Neurological: Negative for headache.  ____________________________________________   PHYSICAL EXAM:  VITAL SIGNS: ED Triage Vitals  Enc Vitals Group     BP 10/05/17 0703 (!) 174/71     Pulse Rate 10/05/17 0703 83     Resp 10/05/17 0703 18     Temp 10/05/17 0703 97.8 F (36.6 C)     Temp Source 10/05/17 0703 Oral     SpO2 10/05/17 0703 94 %     Weight 10/05/17 0705 162 lb (73.5 kg)     Height 10/05/17 0705 5\' 3"  (1.6 m)     Head Circumference --      Peak Flow --      Pain Score 10/05/17 0722 6     Pain Loc --      Pain Edu? --      Excl. in GC? --      Constitutional: Alert and  oriented. Well appearing and in no distress. HEENT   Head: Normocephalic and atraumatic.      Eyes: Conjunctivae are normal. Pupils equal and round.       Ears:         Nose: No congestion/rhinnorhea.   Mouth/Throat: Mucous membranes are mildly dry.   Neck: No stridor. Cardiovascular/Chest: Normal rate, regular rhythm.  No murmurs, rubs, or gallops. Respiratory: Normal respiratory effort without tachypnea nor retractions. Breath sounds are clear and equal bilaterally. No wheezes/rales/rhonchi. Gastrointestinal: Soft. No distention, no guarding, no rebound.  Very mild tenderness in the lower abdomen. Genitourinary/rectal:Deferred Musculoskeletal: Nontender with normal range of motion in all extremities. No joint effusions.  No lower extremity tenderness.  No edema. Neurologic:  Normal speech and language. No gross or focal neurologic deficits are appreciated. Skin:  Skin is warm, dry and intact. No rash noted. Psychiatric: Mood and affect are normal. Speech and behavior are normal. Patient exhibits appropriate insight and  judgment.   ____________________________________________  LABS (pertinent positives/negatives) I, Governor Rooks, MD the attending physician have reviewed the labs noted below.  Labs Reviewed  CBC - Abnormal; Notable for the following components:      Result Value   WBC 17.9 (*)    Hemoglobin 10.8 (*)    HCT 34.2 (*)    MCHC 31.7 (*)    RDW 16.7 (*)    All other components within normal limits  COMPREHENSIVE METABOLIC PANEL - Abnormal; Notable for the following components:   Glucose, Bld 180 (*)    BUN 25 (*)    GFR calc non Af Amer 52 (*)    All other components within normal limits  URINALYSIS, COMPLETE (UACMP) WITH MICROSCOPIC - Abnormal; Notable for the following components:   Color, Urine YELLOW (*)    APPearance CLEAR (*)    Glucose, UA 50 (*)    Hgb urine dipstick SMALL (*)    Squamous Epithelial / LPF 0-5 (*)    All other components within normal limits  CULTURE, BLOOD (ROUTINE X 2)  CULTURE, BLOOD (ROUTINE X 2)  TROPONIN I  INFLUENZA PANEL BY PCR (TYPE A & B)  LACTIC ACID, PLASMA    ____________________________________________    EKG I, Governor Rooks, MD, the attending physician have personally viewed and interpreted all ECGs.  82 bpm.  Normal sinus rhythm.  Narrow QRS.  Normal axis.  Normal ST and T wave ____________________________________________  RADIOLOGY All Xrays were viewed by me.  Imaging interpreted by Radiologist, and I, Governor Rooks, MD the attending physician have reviewed the radiologist interpretation noted below.  Chest x-ray: IMPRESSION: 1.  No acute cardiopulmonary abnormality. 2.  Aortic Atherosclerosis (ICD10-I70.0).  CT abdomen and pelvis with contrast: IMPRESSION: 1. No specific explanation for abdominal pain. 2. Extensive distal colonic diverticulosis. 3. Advanced atherosclerosis with high-grade narrowing of the proximal SMA, bilateral iliac arteries, and right common  femoral artery. __________________________________________  PROCEDURES  Procedure(s) performed: None  Critical Care performed: None   ____________________________________________  ED COURSE / ASSESSMENT AND PLAN  Pertinent labs & imaging results that were available during my care of the patient were reviewed by me and considered in my medical decision making (see chart for details).   Stable vital signs.  She was given IV fluids given concern for possible fluid losses with diarrhea.  Just about 4 episodes at home.  She was found to have elevated white blood cell count, and I did prompt a thorough workup for that, flu negative, no pneumonia on chest  x-ray, CT the abdomen was performed and there is no abnormality there from the standpoint of diarrhea or elevated white blood cell count.  Patient is feeling well on reexamination around.  I discussed with the family whether not to observe for 24 hours just basically given her age, however I also think she would probably be better off at home given that the hospital is full of people who do have actual positive flu, etc.  Family and patient would really like to go home.  I think this is reasonable.  I am most suspicious of probably viral GI illness, although patient does not seem to be having ongoing symptoms.  Blood cultures were sent, we discussed that at this point I am not initiating antibiotics given concern about side effects from antibiotics, including nausea and intestinal flora disruption, given that I am most suspicious of viral illness.  Family is on board with this plan.  DIFFERENTIAL DIAGNOSIS: Differential diagnosis includes, but is not limited to, ovarian cyst, ovarian torsion, acute appendicitis, diverticulitis, urinary tract infection/pyelonephritis, endometriosis, bowel obstruction, colitis, renal colic, gastroenteritis, hernia, fibroids, endometriosis, pregnancy related pain including ectopic pregnancy,  etc.  Differential diagnosis includes, but is not limited to, ACS, aortic dissection, pulmonary embolism, cardiac tamponade, pneumothorax, pneumonia, pericarditis, myocarditis, GI-related causes including esophagitis/gastritis, and musculoskeletal chest wall pain.    CONSULTATIONS:  None  Patient / Family / Caregiver informed of clinical course, medical decision-making process, and agree with plan.   I discussed return precautions, follow-up instructions, and discharge instructions with patient and/or family.  Discharge Instructions : You are evaluated for nausea and diarrhea and were evaluated here in the emergency department, and although no certain cause was found, your exam and evaluation are overall reassuring in the emergency department today.  As we discussed, I am most suspicious of a intestinal virus.  We discussed whether or not to stay overnight in the hospital or start antibiotics, and chose to hold off on this right now.  You may try over-the-counter Imodium, use as directed on label if you have symptoms.  Return to the emergency room immediately for any fever, black or bloody stool, abdominal pain, confusion, or concern for dehydration such as dry mouth or not urinating.    ___________________________________________   FINAL CLINICAL IMPRESSION(S) / ED DIAGNOSES   Final diagnoses:  Atherosclerosis  Diarrhea, unspecified type      ___________________________________________        Note: This dictation was prepared with Dragon dictation. Any transcriptional errors that result from this process are unintentional    Governor Rooks, MD 10/05/17 1229

## 2017-10-05 NOTE — ED Notes (Signed)
Patient reports  diarrhea since 0130 this morning

## 2017-10-05 NOTE — ED Notes (Signed)
Reports previous heart attack Aug 2015, and two stents. Seen here on 09/09/17 for hypertension

## 2017-10-10 LAB — CULTURE, BLOOD (ROUTINE X 2)
Culture: NO GROWTH
Culture: NO GROWTH

## 2017-10-22 ENCOUNTER — Ambulatory Visit (INDEPENDENT_AMBULATORY_CARE_PROVIDER_SITE_OTHER): Payer: Medicare Other | Admitting: Vascular Surgery

## 2017-10-22 ENCOUNTER — Encounter (INDEPENDENT_AMBULATORY_CARE_PROVIDER_SITE_OTHER): Payer: Self-pay | Admitting: Vascular Surgery

## 2017-10-22 VITALS — BP 148/66 | HR 72 | Resp 15 | Ht 63.0 in | Wt 160.0 lb

## 2017-10-22 DIAGNOSIS — I771 Stricture of artery: Secondary | ICD-10-CM

## 2017-10-22 DIAGNOSIS — I779 Disorder of arteries and arterioles, unspecified: Secondary | ICD-10-CM | POA: Insufficient documentation

## 2017-10-22 DIAGNOSIS — I10 Essential (primary) hypertension: Secondary | ICD-10-CM

## 2017-10-22 DIAGNOSIS — I6523 Occlusion and stenosis of bilateral carotid arteries: Secondary | ICD-10-CM

## 2017-10-22 DIAGNOSIS — I700 Atherosclerosis of aorta: Secondary | ICD-10-CM | POA: Diagnosis not present

## 2017-10-22 DIAGNOSIS — E119 Type 2 diabetes mellitus without complications: Secondary | ICD-10-CM | POA: Diagnosis not present

## 2017-10-22 DIAGNOSIS — K551 Chronic vascular disorders of intestine: Secondary | ICD-10-CM | POA: Insufficient documentation

## 2017-10-22 DIAGNOSIS — I739 Peripheral vascular disease, unspecified: Secondary | ICD-10-CM

## 2017-10-22 NOTE — Assessment & Plan Note (Signed)
blood glucose control important in reducing the progression of atherosclerotic disease. Also, involved in wound healing. On appropriate medications.  

## 2017-10-22 NOTE — Assessment & Plan Note (Signed)
As part of her emergency room visit she had a CT scan of the abdomen and pelvis that I have independently reviewed and this was the reason for her referral.  She has scattered atherosclerosis throughout the aorta and branch vessels.  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  Her celiac artery and IMA had good flow.  She also had a right common femoral artery calcific lesion which looked to be moderate to high-grade. No worrisome symptoms and would not recommend intervention in an asymptomatic 84 year old

## 2017-10-22 NOTE — Assessment & Plan Note (Signed)
Sounds quite difficult to control but I did not see any significant renal artery stenosis on review CT scan although this was a somewhat poor study with 5 mm cuts.

## 2017-10-22 NOTE — Assessment & Plan Note (Signed)
Has not been checked in several years.  Patient is adamant she would not have anything done no matter what the ultrasound shows, so I would not order a study at this point.

## 2017-10-22 NOTE — Assessment & Plan Note (Signed)
Seen on the CT scan but without significant symptoms of unintentional weight loss, postprandial abdominal pain, or food fear.  Would not recommend intervention in an asymptomatic a 84 year old

## 2017-10-22 NOTE — Patient Instructions (Signed)

## 2017-10-22 NOTE — Progress Notes (Signed)
Patient ID: Melinda Lewis, female   DOB: 14-Oct-1917, 84 y.o.   MRN: [id number]  Chief Complaint  Patient presents with  . Follow-up    1 week [facility] f/u    HPI Melinda Lewis is a 84 y.o. female.  I am asked to see the patient by Dr. Reita Cliche in the [facility] ER for evaluation of aortic atherosclerosis and SMA stenosis.  She had previously been seen in our office for what sounds like moderate carotid artery stenosis several years ago.  The patient reports no episodes with spikes of blood pressure as well as some chest pain.  She is now on 4 antihypertensives.  She feels well today and does not have complaints.  She has had no further issues since her ER visits a few weeks ago.  She is scheduled to have a continuous monitor checked by her primary care physician later this week to evaluate her blood pressure and heart rate.  As part of her emergency room visit she had a CT scan of the abdomen and pelvis that I have independently reviewed and this was the reason for her referral.  She has scattered atherosclerosis throughout the aorta and branch vessels.  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  Her celiac artery and IMA had good flow.  She also had a right common femoral artery calcific lesion which looked to be moderate to high-grade.   Past Medical History:  Diagnosis Date  . Acute on chronic systolic CHF (congestive heart failure) ([place]) 06/16/2014  . Anemia   . CAD (coronary artery disease) 8/15   s/p DES LCX and LAD   . Cardiomyopathy, ischemic 06/16/2014  . Diabetes mellitus without complication ([place])   . Hypertension   . NSTEMI (non-ST elevated myocardial infarction) ([place])   . Thyroid disease   . TIA (transient ischemic attack) 2010    Past Surgical History:  Procedure Laterality Date  . LEFT HEART CATHETERIZATION WITH CORONARY ANGIOGRAM N/A 05/03/2014   Procedure: LEFT HEART CATHETERIZATION WITH CORONARY ANGIOGRAM;  Surgeon: Peter  M Martinique, MD;  Location: [facility] CATH LAB;  Service: Cardiovascular;  Laterality: N/A;  . PERCUTANEOUS CORONARY STENT INTERVENTION (PCI-S) Right 05/03/2014   Procedure: PERCUTANEOUS CORONARY STENT INTERVENTION (PCI-S);  Surgeon: Peter M Martinique, MD;  Location: [facility] CATH LAB;  Service: Cardiovascular;  Laterality: Right;  . PERCUTANEOUS CORONARY STENT INTERVENTION (PCI-S) N/A 05/04/2014   Procedure: PERCUTANEOUS CORONARY STENT INTERVENTION (PCI-S);  Surgeon: Peter M Martinique, MD;  Location: [facility] CATH LAB;  Service: Cardiovascular;  Laterality: N/A;    Family History No bleeding disorders, clotting disorders, aneurysms, or autoimmune diseases  Social History Social History   Tobacco Use  . Smoking status: Former Smoker    Types: Cigarettes  . Smokeless tobacco: Never Used  Substance Use Topics  . Alcohol use: No  . Drug use: No    Allergies  Allergen Reactions  . Cefuroxime Axetil Nausea Only    cramps  . Clonidine Derivatives Other (See Comments)    Maker her pass out  . Epinephrine   . Penicillins   . Septra [Sulfamethoxazole-Trimethoprim] Nausea And Vomiting    Current Outpatient Medications  Medication Sig Dispense Refill  . aspirin EC 81 MG tablet Take 81 mg by mouth daily.    Marland Kitchen atorvastatin (LIPITOR) 40 MG tablet TAKE 1 TABLET BY MOUTH DAILY AT 6PM 90 tablet 2  . Flaxseed, Linseed, (FLAX SEED OIL PO) Take 1 tablet by mouth daily.    Marland Kitchen  hydrALAZINE (APRESOLINE) 50 MG tablet Take 1 tablet (50 mg total) by mouth daily as needed (For Systolic [id number]). 30 tablet 0  . L-Arginine 1000 MG TABS Take 1 tablet by mouth 3 (three) times daily.    Marland Kitchen levothyroxine (SYNTHROID, LEVOTHROID) 100 MCG tablet TAKE 1 TABLET EVERY DAY ON EMPTY STOMACHWITH A GLASS OF WATER AT LEAST 30-60 MINBEFORE BREAKFAST    . lisinopril (PRINIVIL,ZESTRIL) 40 MG tablet Take 1 tablet (40 mg total) by mouth daily. 30 tablet 0  . magnesium oxide (MAG-OX) 400 MG tablet Take 400 mg by mouth 2 (two) times daily.    . meclizine  (ANTIVERT) 12.5 MG tablet Take 1 tablet (12.5 mg total) by mouth 3 (three) times daily as needed for dizziness. 15 tablet 0  . metFORMIN (GLUCOPHAGE) 500 MG tablet Take 500 mg by mouth daily.     . metoprolol succinate (TOPROL-XL) 25 MG 24 hr tablet TAKE 1 TABLET BY MOUTH EVERY DAY (Patient taking differently: TAKE 1 TABLET BY MOUTH TWICE DAILY) 90 tablet 3  . ondansetron (ZOFRAN) 4 MG tablet Take 1 tablet (4 mg total) by mouth every 8 (eight) hours as needed for nausea or vomiting. 10 tablet 0  . pantoprazole (PROTONIX) 40 MG tablet Take 40 mg by mouth daily.    . QUEtiapine (SEROQUEL) 25 MG tablet Take 1 tablet by mouth at bedtime.    Marland Kitchen amLODipine (NORVASC) 10 MG tablet Take 1 tablet (10 mg total) by mouth daily. (Patient not taking: Reported on 10/15/2017) 30 tablet 0   No current facility-administered medications for this visit.       REVIEW OF SYSTEMS (Negative unless checked)  Constitutional: [] Weight loss  [] Fever  [] Chills Cardiac: [] Chest pain   [] Chest pressure   [] Palpitations   [] Shortness of breath when laying flat   [] Shortness of breath at rest   [] Shortness of breath with exertion. Vascular:  [] Pain in legs with walking   [] Pain in legs at rest   [] Pain in legs when laying flat   [] Claudication   [] Pain in feet when walking  [] Pain in feet at rest  [] Pain in feet when laying flat   [] History of DVT   [] Phlebitis   [] Swelling in legs   [] Varicose veins   [] Non-healing ulcers Pulmonary:   [] Uses home oxygen   [] Productive cough   [] Hemoptysis   [] Wheeze  [] COPD   [] Asthma Neurologic:  [x] Dizziness  [] Blackouts   [] Seizures   [] History of stroke   [] History of TIA  [] Aphasia   [] Temporary blindness   [] Dysphagia   [] Weakness or numbness in arms   [] Weakness or numbness in legs Musculoskeletal:  [x] Arthritis   [] Joint swelling   [] Joint pain   [] Low back pain Hematologic:  [] Easy bruising  [] Easy bleeding   [] Hypercoagulable state   [] Anemic  [] Hepatitis Gastrointestinal:  [] Blood in  stool   [] Vomiting blood  [] Gastroesophageal reflux/heartburn   [] Abdominal pain Genitourinary:  [] Chronic kidney disease   [] Difficult urination  [] Frequent urination  [] Burning with urination   [] Hematuria Skin:  [] Rashes   [] Ulcers   [] Wounds Psychological:  [] History of anxiety   []  History of major depression.    Physical Exam BP (!) 148/66 (BP Location: Right Arm, Patient Position: Sitting)   Pulse 72   Resp 15   Ht 5' 3"  (1.6 m)   Wt 72.6 kg (160 lb)   BMI 28.34 kg/m  Gen:  WD/WN, NAD.  Appears far younger than stated age Head: [place]/AT, No temporalis wasting. Ear/Nose/Throat: Hearing  decreased, nares w/o erythema or drainage, oropharynx w/o Erythema/Exudate Eyes: Conjunctiva clear, sclera non-icteric  Neck: trachea midline.  No bruit.  Pulmonary:  Good air movement, clear to auscultation bilaterally.  Cardiac: RRR, no JVD Vascular:  Vessel Right Left  Radial Palpable Palpable                                   Gastrointestinal: soft, non-tender/non-distended.  Musculoskeletal: M/S 5/5 throughout.  Extremities without ischemic changes.  No deformity or atrophy.  Trace lower extremity edema. Neurologic: Sensation grossly intact in extremities.  Symmetrical.  Speech is fluent. Motor exam as listed above. Psychiatric: Judgment intact, Mood & affect appropriate for pt's clinical situation. Dermatologic: No rashes or ulcers noted.  No cellulitis or open wounds.  Radiology Ct Abdomen Pelvis W Contrast  Result Date: 10/05/2017 CLINICAL DATA:  Abdominal pain with diverticulitis suspected. Diarrhea this morning. EXAM: CT ABDOMEN AND PELVIS WITH CONTRAST TECHNIQUE: Multidetector CT imaging of the abdomen and pelvis was performed using the standard protocol following bolus administration of intravenous contrast. CONTRAST:  173m ISOVUE-300 IOPAMIDOL (ISOVUE-300) INJECTION 61% COMPARISON:  09/08/2017 FINDINGS: Lower chest: Extensive atherosclerotic calcification. Right infrahilar  nodal calcified granuloma. Hepatobiliary: Granulomatous type calcifications in the liver.No evidence of biliary obstruction or stone. Pancreas: Unremarkable. Spleen: Granulomatous calcifications. Adrenals/Urinary Tract: Negative adrenals. No hydronephrosis or stone. Few tiny presumed renal cysts. Mild bladder wall thickening that is chronic based on prior. Stomach/Bowel: No obstruction or inflammation. Extensive sigmoid diverticulosis. No appendicitis. Rectocele. Vascular/Lymphatic: No acute vascular abnormality. There is advanced atherosclerosis of the aorta and branch vessels. Bulky plaque causes high-grade proximal SMA, right common femoral, and bilateral proximal iliacs. Chronic irregular plaque/luminal thrombus notably at the level of the proximal abdominal aorta. Mild ectasia of the infrarenal aorta measuring up to 28 mm diameter. No mass or adenopathy. Reproductive:  Pelvic floor laxity. Other: Trace pelvic fluid that is nonspecific. Musculoskeletal: Advanced lumbar disc and facet degeneration. No acute finding. IMPRESSION: 1. No specific explanation for abdominal pain. 2. Extensive distal colonic diverticulosis. 3. Advanced atherosclerosis with high-grade narrowing of the proximal SMA, bilateral iliac arteries, and right common femoral artery. Electronically Signed   By: JMonte FantasiaM.D.   On: 10/05/2017 10:14   Dg Chest Port 1 View  Result Date: 10/05/2017 CLINICAL DATA:  84year old female with chest pain since 0300 hours. EXAM: PORTABLE CHEST 1 VIEW COMPARISON:  09/08/2017 and earlier. FINDINGS: Portable AP upright view at 0729 hours. Stable lung volumes. Calcified aortic atherosclerosis. Other mediastinal contours are within normal limits. Visualized tracheal air column is within normal limits. Chronic left lower lung calcified granuloma. The lungs otherwise are clear allowing for portable technique. No pneumothorax or pleural effusion. Advanced degenerative osseous changes at both shoulders.  IMPRESSION: 1.  No acute cardiopulmonary abnormality. 2.  Aortic Atherosclerosis (ICD10-I70.0). Electronically Signed   By: HGenevie AnnM.D.   On: 10/05/2017 07:56    Labs Recent Results (from the past 2160 hour(s))  Comprehensive metabolic panel     Status: Abnormal   Collection Time: 09/08/17  2:17 PM  Result Value Ref Range   Sodium 137 135 - 145 mmol/L   Potassium 3.8 3.5 - 5.1 mmol/L   Chloride 103 101 - 111 mmol/L   CO2 23 22 - 32 mmol/L   Glucose, Bld 186 (H) 65 - 99 mg/dL   BUN 29 (H) 6 - 20 mg/dL   Creatinine, Ser 1.18 (H) 0.44 - 1.00  mg/dL   Calcium 9.4 8.9 - 10.3 mg/dL   Total Protein 7.4 6.5 - 8.1 g/dL   Albumin 3.9 3.5 - 5.0 g/dL   AST 27 15 - 41 U/L   ALT 15 14 - 54 U/L   Alkaline Phosphatase 68 38 - 126 U/L   Total Bilirubin 0.4 0.3 - 1.2 mg/dL   GFR calc non Af Amer 37 (L) >60 mL/min   GFR calc Af Amer 43 (L) >60 mL/min    Comment: (NOTE) The eGFR has been calculated using the CKD EPI equation. This calculation has not been validated in all clinical situations. eGFR's persistently <60 mL/min signify possible Chronic Kidney Disease.    Anion gap 11 5 - 15    Comment: Performed at [facility], [address] [place], [place] [address]  Lipase, blood     Status: None   Collection Time: 09/08/17  2:17 PM  Result Value Ref Range   Lipase 30 11 - 51 U/L    Comment: Performed at [facility], [address] [place], [place] [address]  Troponin I     Status: None   Collection Time: 09/08/17  2:17 PM  Result Value Ref Range   Troponin I <0.03 <0.03 ng/mL    Comment: Performed at [facility], [address] [place], [place] [address]  I-STAT creatinine     Status: Abnormal   Collection Time: 09/08/17  2:42 PM  Result Value Ref Range   Creatinine, Ser 1.30 (H) 0.44 - 1.00 mg/dL  Urinalysis, Complete w Microscopic     Status: Abnormal   Collection Time: 09/08/17  3:04 PM  Result Value Ref Range   Color, Urine COLORLESS (A) YELLOW    APPearance CLEAR (A) CLEAR   Specific Gravity, Urine 1.014 1.005 - 1.030   pH 6.0 5.0 - 8.0   Glucose, UA 50 (A) NEGATIVE mg/dL   Hgb urine dipstick NEGATIVE NEGATIVE   Bilirubin Urine NEGATIVE NEGATIVE   Ketones, ur NEGATIVE NEGATIVE mg/dL   Protein, ur NEGATIVE NEGATIVE mg/dL   Nitrite NEGATIVE NEGATIVE   Leukocytes, UA NEGATIVE NEGATIVE   RBC / HPF 0-5 0 - 5 RBC/hpf   WBC, UA 0-5 0 - 5 WBC/hpf   Bacteria, UA NONE SEEN NONE SEEN   Squamous Epithelial / LPF NONE SEEN NONE SEEN    Comment: Performed at [facility], [address] [place], [place] [address]  Protime-INR     Status: None   Collection Time: 09/08/17  3:04 PM  Result Value Ref Range   Prothrombin Time 12.5 11.4 - 15.2 seconds   INR 0.94     Comment: Performed at [facility], [address] [place], [place] [address]  CBC with Differential/Platelet     Status: Abnormal   Collection Time: 09/08/17  3:04 PM  Result Value Ref Range   WBC 11.1 (H) 3.6 - 11.0 K/uL   RBC 4.02 3.80 - 5.20 MIL/uL   Hemoglobin 10.7 (L) 12.0 - 16.0 g/dL   HCT 34.1 (L) 35.0 - 47.0 %   MCV 84.8 80.0 - 100.0 fL   MCH 26.5 26.0 - 34.0 pg   MCHC 31.3 (L) 32.0 - 36.0 g/dL   RDW 16.0 (H) 11.5 - 14.5 %   Platelets 215 150 - 440 K/uL   Neutrophils Relative % 77 %   Neutro Abs 8.7 (H) 1.4 - 6.5 K/uL   Lymphocytes Relative 15 %   Lymphs Abs 1.6 1.0 - 3.6 K/uL  Monocytes Relative 6 %   Monocytes Absolute 0.6 0.2 - 0.9 K/uL   Eosinophils Relative 1 %   Eosinophils Absolute 0.1 0 - 0.7 K/uL   Basophils Relative 1 %   Basophils Absolute 0.1 0 - 0.1 K/uL    Comment: Performed at [facility], [address] [place], [place] [address]  Glucose, capillary     Status: Abnormal   Collection Time: 09/08/17  9:54 PM  Result Value Ref Range   Glucose-Capillary 151 (H) 65 - 99 mg/dL  CBC     Status: Abnormal   Collection Time: 09/09/17  5:49 AM  Result Value Ref Range   WBC 9.9 3.6 - 11.0 K/uL   RBC 3.46 (L) 3.80 -  5.20 MIL/uL   Hemoglobin 9.2 (L) 12.0 - 16.0 g/dL   HCT 28.6 (L) 35.0 - 47.0 %   MCV 82.8 80.0 - 100.0 fL   MCH 26.6 26.0 - 34.0 pg   MCHC 32.2 32.0 - 36.0 g/dL   RDW 15.9 (H) 11.5 - 14.5 %   Platelets 220 150 - 440 K/uL    Comment: Performed at [facility], [address] [place], [place] [address]  Basic metabolic panel     Status: Abnormal   Collection Time: 09/09/17  5:49 AM  Result Value Ref Range   Sodium 135 135 - 145 mmol/L   Potassium 3.9 3.5 - 5.1 mmol/L   Chloride 104 101 - 111 mmol/L   CO2 26 22 - 32 mmol/L   Glucose, Bld 106 (H) 65 - 99 mg/dL   BUN 21 (H) 6 - 20 mg/dL   Creatinine, Ser 0.97 0.44 - 1.00 mg/dL   Calcium 8.4 (L) 8.9 - 10.3 mg/dL   GFR calc non Af Amer 47 (L) >60 mL/min   GFR calc Af Amer 55 (L) >60 mL/min    Comment: (NOTE) The eGFR has been calculated using the CKD EPI equation. This calculation has not been validated in all clinical situations. eGFR's persistently <60 mL/min signify possible Chronic Kidney Disease.    Anion gap 5 5 - 15    Comment: Performed at [facility], [address] [place], [place] [address]  TSH     Status: Abnormal   Collection Time: 09/09/17  5:49 AM  Result Value Ref Range   TSH 4.541 (H) 0.350 - 4.500 uIU/mL    Comment: Performed by a 3rd Generation assay with a functional sensitivity of <=0.01 uIU/mL. Performed at [facility], [address] [place], [place] [address]   Glucose, capillary     Status: Abnormal   Collection Time: 09/09/17  6:31 AM  Result Value Ref Range   Glucose-Capillary 105 (H) 65 - 99 mg/dL  Glucose, capillary     Status: Abnormal   Collection Time: 09/09/17  7:46 AM  Result Value Ref Range   Glucose-Capillary 105 (H) 65 - 99 mg/dL  Glucose, capillary     Status: Abnormal   Collection Time: 09/09/17 11:55 AM  Result Value Ref Range   Glucose-Capillary 143 (H) 65 - 99 mg/dL  Basic metabolic panel     Status: Abnormal   Collection Time: 09/10/17  4:43 AM    Result Value Ref Range   Sodium 135 135 - 145 mmol/L   Potassium 3.6 3.5 - 5.1 mmol/L   Chloride 103 101 - 111 mmol/L   CO2 26 22 - 32 mmol/L   Glucose, Bld 105 (H) 65 - 99 mg/dL   BUN 15 6 -  20 mg/dL   Creatinine, Ser 0.97 0.44 - 1.00 mg/dL   Calcium 8.5 (L) 8.9 - 10.3 mg/dL   GFR calc non Af Amer 47 (L) >60 mL/min   GFR calc Af Amer 55 (L) >60 mL/min    Comment: (NOTE) The eGFR has been calculated using the CKD EPI equation. This calculation has not been validated in all clinical situations. eGFR's persistently <60 mL/min signify possible Chronic Kidney Disease.    Anion gap 6 5 - 15    Comment: Performed at [facility], [address] [place], [place] [address]  CBC     Status: Abnormal   Collection Time: 09/10/17  4:43 AM  Result Value Ref Range   WBC 9.0 3.6 - 11.0 K/uL   RBC 3.64 (L) 3.80 - 5.20 MIL/uL   Hemoglobin 9.6 (L) 12.0 - 16.0 g/dL   HCT 30.0 (L) 35.0 - 47.0 %   MCV 82.6 80.0 - 100.0 fL   MCH 26.4 26.0 - 34.0 pg   MCHC 32.0 32.0 - 36.0 g/dL   RDW 16.0 (H) 11.5 - 14.5 %   Platelets 219 150 - 440 K/uL    Comment: Performed at [facility], [address] [place], [place] [address]  Glucose, capillary     Status: Abnormal   Collection Time: 09/10/17  8:43 AM  Result Value Ref Range   Glucose-Capillary 121 (H) 65 - 99 mg/dL   Comment 1 Notify RN    Comment 2 Document in Chart   Glucose, capillary     Status: Abnormal   Collection Time: 09/11/17  7:56 AM  Result Value Ref Range   Glucose-Capillary 101 (H) 65 - 99 mg/dL  Glucose, capillary     Status: Abnormal   Collection Time: 09/12/17  7:59 AM  Result Value Ref Range   Glucose-Capillary 106 (H) 65 - 99 mg/dL   Comment 1 Notify RN    Comment 2 Document in Chart   Cortisol     Status: None   Collection Time: 09/12/17  9:13 AM  Result Value Ref Range   Cortisol, Plasma 11.6 ug/dL    Comment: (NOTE) AM    6.7 - 22.6 ug/dL PM   <10.0       ug/dL Performed at [facility] [address] [place], [place] [address]   Basic metabolic panel     Status: Abnormal   Collection Time: 09/12/17  9:13 AM  Result Value Ref Range   Sodium 134 (L) 135 - 145 mmol/L   Potassium 4.3 3.5 - 5.1 mmol/L   Chloride 99 (L) 101 - 111 mmol/L   CO2 29 22 - 32 mmol/L   Glucose, Bld 110 (H) 65 - 99 mg/dL   BUN 22 (H) 6 - 20 mg/dL   Creatinine, Ser 1.13 (H) 0.44 - 1.00 mg/dL   Calcium 9.1 8.9 - 10.3 mg/dL   GFR calc non Af Amer 39 (L) >60 mL/min   GFR calc Af Amer 46 (L) >60 mL/min    Comment: (NOTE) The eGFR has been calculated using the CKD EPI equation. This calculation has not been validated in all clinical situations. eGFR's persistently <60 mL/min signify possible Chronic Kidney Disease.    Anion gap 6 5 - 15    Comment: Performed at [facility], [address] [place], [place] [address]  CBC     Status: Abnormal   Collection Time: 10/05/17  7:09 AM  Result Value Ref Range   WBC  17.9 (H) 3.6 - 11.0 K/uL   RBC 4.12 3.80 - 5.20 MIL/uL   Hemoglobin 10.8 (L) 12.0 - 16.0 g/dL   HCT 34.2 (L) 35.0 - 47.0 %   MCV 83.0 80.0 - 100.0 fL   MCH 26.3 26.0 - 34.0 pg   MCHC 31.7 (L) 32.0 - 36.0 g/dL   RDW 16.7 (H) 11.5 - 14.5 %   Platelets 307 150 - 440 K/uL    Comment: Performed at [facility], [address] [place], [place] [address]  Troponin I     Status: None   Collection Time: 10/05/17  7:09 AM  Result Value Ref Range   Troponin I <0.03 <0.03 ng/mL    Comment: Performed at [facility], [address] [place], [place] [address]  Comprehensive metabolic panel     Status: Abnormal   Collection Time: 10/05/17  7:09 AM  Result Value Ref Range   Sodium 135 135 - 145 mmol/L   Potassium 4.6 3.5 - 5.1 mmol/L   Chloride 104 101 - 111 mmol/L   CO2 22 22 - 32 mmol/L   Glucose, Bld 180 (H) 65 - 99 mg/dL   BUN 25 (H) 6 - 20 mg/dL   Creatinine, Ser 0.90 0.44 - 1.00 mg/dL   Calcium 9.2 8.9 - 10.3 mg/dL   Total Protein 6.9 6.5 - 8.1 g/dL    Albumin 3.6 3.5 - 5.0 g/dL   AST 27 15 - 41 U/L   ALT 15 14 - 54 U/L   Alkaline Phosphatase 53 38 - 126 U/L   Total Bilirubin 0.6 0.3 - 1.2 mg/dL   GFR calc non Af Amer 52 (L) >60 mL/min   GFR calc Af Amer >60 >60 mL/min    Comment: (NOTE) The eGFR has been calculated using the CKD EPI equation. This calculation has not been validated in all clinical situations. eGFR's persistently <60 mL/min signify possible Chronic Kidney Disease.    Anion gap 9 5 - 15    Comment: Performed at [facility], [address] [place], [place] [address]  Urinalysis, Complete w Microscopic     Status: Abnormal   Collection Time: 10/05/17  8:45 AM  Result Value Ref Range   Color, Urine YELLOW (A) YELLOW   APPearance CLEAR (A) CLEAR   Specific Gravity, Urine 1.014 1.005 - 1.030   pH 5.0 5.0 - 8.0   Glucose, UA 50 (A) NEGATIVE mg/dL   Hgb urine dipstick SMALL (A) NEGATIVE   Bilirubin Urine NEGATIVE NEGATIVE   Ketones, ur NEGATIVE NEGATIVE mg/dL   Protein, ur NEGATIVE NEGATIVE mg/dL   Nitrite NEGATIVE NEGATIVE   Leukocytes, UA NEGATIVE NEGATIVE   RBC / HPF 0-5 0 - 5 RBC/hpf   WBC, UA 0-5 0 - 5 WBC/hpf   Bacteria, UA NONE SEEN NONE SEEN   Squamous Epithelial / LPF 0-5 (A) NONE SEEN    Comment: Performed at [facility], [address] [place], [place] [address]  Influenza panel by PCR (type A & B)     Status: None   Collection Time: 10/05/17  8:45 AM  Result Value Ref Range   Influenza A By PCR NEGATIVE NEGATIVE   Influenza B By PCR NEGATIVE NEGATIVE    Comment: (NOTE) The Xpert Xpress Flu assay is intended as an aid in the diagnosis of  influenza and should not be used as a sole basis for treatment.  This  assay is FDA approved for nasopharyngeal swab specimens only. Nasal  washings and aspirates are unacceptable for Xpert Xpress Flu testing. Performed at [facility], [address] [place], [place] [address]   Lactic acid, plasma     Status: None   Collection  Time: 10/05/17  9:48 AM  Result Value Ref Range   Lactic Acid, Venous 1.7 0.5 - 1.9 mmol/L    Comment: Performed at [facility], [address] [place], [place] [address]  Culture, blood (routine x 2)     Status: None   Collection Time: 10/05/17 11:18 AM  Result Value Ref Range   Specimen Description      BLOOD Blood Culture results may not be optimal due to an excessive volume of blood received in culture bottles   Special Requests      BOTTLES DRAWN AEROBIC AND ANAEROBIC RIGHT ANTECUBITAL   Culture      NO GROWTH 5 DAYS Performed at [facility], [address] [place], [place] [address]    Report Status 10/10/2017 FINAL   Culture, blood (routine x 2)     Status: None   Collection Time: 10/05/17 11:44 AM  Result Value Ref Range   Specimen Description BLOOD LT FOREARM    Special Requests      BOTTLES DRAWN AEROBIC AND ANAEROBIC Blood Culture results may not be optimal due to an excessive volume of blood received in culture bottles   Culture      NO GROWTH 5 DAYS Performed at [facility], [address], [place], [place] [address]    Report Status 10/10/2017 FINAL     Assessment/Plan:  Hypertension Sounds quite difficult to control but I did not see any significant renal artery stenosis on review CT scan although this was a somewhat poor study with 5 mm cuts.  Diabetes mellitus without complication (HCC) blood glucose control important in reducing the progression of atherosclerotic disease. Also, involved in wound healing. On appropriate medications.   SMA stenosis (HCC) Seen on the CT scan but without significant symptoms of unintentional weight loss, postprandial abdominal pain, or food fear.  Would not recommend intervention in an asymptomatic a 84 year old  Aortic atherosclerosis ([place]) As part of her emergency room visit she had a CT scan of the abdomen and pelvis that I have independently reviewed and this was the reason for her  referral.  She has scattered atherosclerosis throughout the aorta and branch vessels.  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  Her celiac artery and IMA had good flow.  She also had a right common femoral artery calcific lesion which looked to be moderate to high-grade. No worrisome symptoms and would not recommend intervention in an asymptomatic 84 year old  Carotid disease, bilateral ([place]) Has not been checked in several years.  Patient is adamant she would not have anything done no matter what the ultrasound shows, so I would not order a study at this point.      Leotis Pain 10/22/2017, 11:49 AM   This note was created with Dragon medical transcription system.  Any errors from dictation are unintentional.

## 2017-11-22 ENCOUNTER — Telehealth: Payer: Self-pay | Admitting: Cardiology

## 2017-11-22 NOTE — Telephone Encounter (Signed)
Returned call to patient's daughter Gunnar Fusi.She stated mother has been having frequent chest pain.Stated she does not know when it started.She just told her about chest pain today.No chest pain at present.Stated she would like appointment with Dr.Jordan.Appointment scheduled with Dr.Jordan Mon 11/25/17 at 3:20 pm.

## 2017-11-22 NOTE — Telephone Encounter (Signed)
Advised to go to ED if needed. 

## 2017-11-22 NOTE — Telephone Encounter (Signed)
Pt c/o of Chest Pain: STAT if CP now or developed within 24 hours  1. Are you having CP right now? No   2. Are you experiencing any other symptoms (ex. SOB, nausea, vomiting, sweating)? No  3. How long have you been experiencing CP? Patient daughter is not sure when it started , has been going on for few days. 4. Is your CP continuous or coming and going? Coming and going   5. Have you taken Nitroglycerin? no  Gunnar Fusi (patient daughter) asks that you call her after 4 pm and Gunnar Fusi states that you can also reach at her cell phone [phone])

## 2017-11-23 NOTE — Progress Notes (Signed)
Melinda Lewis Date of Birth: 1917-11-04 Medical Record #[id number]  History of Present Illness: Melinda Lewis is seen for evaluation of chest pain. She is a pleasant 84 yo WF with history of DM type 2, HTN, and hypercholesterolemia who was admitted from 8/16-8/19/15 with a NSTEMI. She presented with acute respiratory failure and was hypertensive.  By Echo EF was 35-40%. She underwent cardiac cath which demonstrated severe disease in the proximal and mid LAD and mid LCx. The RCA had an 80% stenosis in the mid vessel. She had stenting of the mid LCx, proximal and mid LAD with DES stents. Repeat Echo in Dec. 2015 showed normal EF. She was seen in September 2017 with atypical chest pain. Echo was unchanged.  She was admitted in December with symptoms of nausea and dizziness. CT and MRI of head unremarkable. She was hypertensive with no significant orthostasis. BP medication increased with increase in lisinopril and addition of amlodipine. She was seen in ED in January with atypical chest and abdominal pain. CT showed aortic atherosclerosis with SMA stenosis and moderate right iliac disease. Seen in follow up with Dr. Wyn Quaker with vascular surgery who felt she had no symptoms related to this disease and conservative management recommended.   She is seen today with her daughter. She reports an episode of localized left precordial chest pain on March 5. Described as mild. Did not take anything for it. Pain only lasted 5 minutes. No radiation. No recurrence. Still active.   Allergies as of 11/25/2017      Reactions   Cefuroxime Axetil Nausea Only   cramps   Clonidine Derivatives Other (See Comments)   Maker her pass out   Epinephrine    Penicillins    Septra [sulfamethoxazole-trimethoprim] Nausea And Vomiting      Medication List        Accurate as of 11/25/17  3:38 PM. Always use your most recent med list.          aspirin EC 81 MG tablet Take 81 mg by mouth daily.   atorvastatin 40 MG  tablet Commonly known as:  LIPITOR TAKE 1 TABLET BY MOUTH DAILY AT 6PM   FLAX SEED OIL PO Take 1 tablet by mouth daily.   hydrALAZINE 50 MG tablet Commonly known as:  APRESOLINE Take 1 tablet (50 mg total) by mouth daily as needed (For Systolic BP>200).   L-Arginine 1000 MG Tabs Take 1 tablet by mouth 3 (three) times daily.   levothyroxine 100 MCG tablet Commonly known as:  SYNTHROID, LEVOTHROID TAKE 1 TABLET EVERY DAY ON EMPTY STOMACHWITH A GLASS OF WATER AT LEAST 30-60 MINBEFORE BREAKFAST   lisinopril 40 MG tablet Commonly known as:  PRINIVIL,ZESTRIL Take 1 tablet (40 mg total) by mouth daily.   magnesium oxide 400 MG tablet Commonly known as:  MAG-OX Take 400 mg by mouth 2 (two) times daily.   metFORMIN 500 MG tablet Commonly known as:  GLUCOPHAGE Take 500 mg by mouth daily.   metoprolol succinate 25 MG 24 hr tablet Commonly known as:  TOPROL-XL TAKE 1 TABLET BY MOUTH EVERY DAY   nitroGLYCERIN 0.4 MG SL tablet Commonly known as:  NITROSTAT Place 1 tablet (0.4 mg total) under the tongue every 5 (five) minutes as needed for chest pain.   pantoprazole 40 MG tablet Commonly known as:  PROTONIX Take 40 mg by mouth daily.   QUEtiapine 25 MG tablet Commonly known as:  SEROQUEL Take 1 tablet by mouth at bedtime.  Allergies  Allergen Reactions  . Cefuroxime Axetil Nausea Only    cramps  . Clonidine Derivatives Other (See Comments)    Maker her pass out  . Epinephrine   . Penicillins   . Septra [Sulfamethoxazole-Trimethoprim] Nausea And Vomiting    Past Medical History:  Diagnosis Date  . Acute on chronic systolic CHF (congestive heart failure) (HCC) 06/16/2014  . Anemia   . CAD (coronary artery disease) 8/15   s/p DES LCX and LAD   . Cardiomyopathy, ischemic 06/16/2014  . Diabetes mellitus without complication (HCC)   . Hypertension   . NSTEMI (non-ST elevated myocardial infarction) (HCC)   . Thyroid disease   . TIA (transient ischemic attack)  2010    Past Surgical History:  Procedure Laterality Date  . LEFT HEART CATHETERIZATION WITH CORONARY ANGIOGRAM N/A 05/03/2014   Procedure: LEFT HEART CATHETERIZATION WITH CORONARY ANGIOGRAM;  Surgeon: Peter M Swaziland, MD;  Location: [facility] CATH LAB;  Service: Cardiovascular;  Laterality: N/A;  . PERCUTANEOUS CORONARY STENT INTERVENTION (PCI-S) Right 05/03/2014   Procedure: PERCUTANEOUS CORONARY STENT INTERVENTION (PCI-S);  Surgeon: Peter M Swaziland, MD;  Location: [facility] CATH LAB;  Service: Cardiovascular;  Laterality: Right;  . PERCUTANEOUS CORONARY STENT INTERVENTION (PCI-S) N/A 05/04/2014   Procedure: PERCUTANEOUS CORONARY STENT INTERVENTION (PCI-S);  Surgeon: Peter M [place], MD;  Location[facility] CATH LAB;  Service: Cardiovascular;  Laterality: N/A;    Social History   Socioeconomic History  . Marital status: Widowed    Spouse name: None  . Number of children: None  . Years of education: None  . Highest education level: None  Social Needs  . Financial resource strain: None  . Food insecurity - worry: None  . Food insecurity - inability: None  . Transportation needs - medical: None  . Transportation needs - non-medical: None  Occupational History  . None  Tobacco Use  . Smoking status: Former Smoker    Types: Cigarettes  . Smokeless tobacco: Never Used  Substance and Sexual Activity  . Alcohol use: No  . Drug use: No  . Sexual activity: None  Other Topics Concern  . None  Social History Narrative  . None    History reviewed. No pertinent family history.  Review of Systems: As noted in HPI.  All other systems were reviewed and are negative.  Physical Exam: BP 136/70   Pulse 68   Ht  (1.6 m)   Wt 160 lb (72.6 kg)   BMI 28.34 kg/m  Filed Weights   11/25/17 1503  Weight: 160 lb (72.6 kg)  GENERAL:  Well appearing, elderly WF in NAD HEENT:  PERRL, EOMI, sclera are clear. Oropharynx is clear. NECK:  No jugular venous distention, carotid upstroke brisk and symmetric, no  bruits, no thyromegaly or adenopathy LUNGS:  Clear to auscultation bilaterally CHEST:  Unremarkable HEART:  RRR,  PMI not displaced or sustained,S1 and S2 within normal limits, no S3, no S4: no clicks, no rubs, no murmurs ABD:  Soft, nontender. BS +, no masses or bruits. No hepatomegaly, no splenomegaly EXT:  2 + pulses throughout, no edema, no cyanosis no clubbing SKIN:  Warm and dry.  No rashes NEURO:  Alert and oriented x 3. Cranial nerves II through XII intact. PSYCH:  Cognitively intact      LABORATORY DATA: Labs from primary care dated 09/05/16: CBC with Hgb 12.3 WBC 10.7, BUN 24, creatinine 1.1. Glucose 118. Other chemistries normal. A1c 6.6%. TSH 9.7. Cholesterol 148, trig- 150, HDL 41, LDL 77.  Dated 03/04/17: Hgb 10.8, WBC 12.4. Potassium 5.3. BUN 30, creatinine 1.1. Other chemistries normal. Glucose 98. Dated 10/21/17: glucose 180, A1c 7.1%. Otherwise CMET normal. Hgb 9.8. UA normal.   Ecg today shows NSR rate 68. LAE. Otherwise normal. I have personally reviewed and interpreted this study.  Echo 06/14/16: Study Conclusions  - Left ventricle: The cavity size was normal. Wall thickness was   increased in a pattern of mild LVH. Systolic function was normal.   The estimated ejection fraction was in the range of 60% to 65%.   Wall motion was normal; there were no regional wall motion   abnormalities. Doppler parameters are consistent with abnormal   left ventricular relaxation (grade 1 diastolic dysfunction). - Mitral valve: Calcified annulus. - Tricuspid valve: There was mild regurgitation. - Pulmonary arteries: Systolic pressure was mildly increased. PA   peak pressure: 39 mm Hg (S).  Impressions:  - Compared to the prior study, there has been no significant   interval change.  Assessment / Plan: 1. CAD - S/p complex stenting of the proximal and mid LAD, Mid LCx with DESs in August 2015. Moderate RCA disease that has been treated medically. She is had a single episode  of chest pain lasting only 5 minutes. No recurrence. We will send in Rx for sl Ntg to use prn. Call if chest pain accelerates.   2. Chronic systolic CHF due to ischemic cardiomyopathy. Continue ACEi, metoprolol, lasix.  Echo in September 2017 showed normal LV function. Continue sodium restriction.  3. Hypercholesterolemia. Well controlled on statin.  4. HTN well controlled.  5. DM type 2.   6. 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 Asymptomatic. Agree with conservative therapy.  Follow up in 6 months.

## 2017-11-25 ENCOUNTER — Encounter: Payer: Self-pay | Admitting: Cardiology

## 2017-11-25 ENCOUNTER — Ambulatory Visit: Payer: Medicare Other | Admitting: Cardiology

## 2017-11-25 VITALS — BP 136/70 | HR 68 | Ht 63.0 in | Wt 160.0 lb

## 2017-11-25 DIAGNOSIS — I10 Essential (primary) hypertension: Secondary | ICD-10-CM | POA: Diagnosis not present

## 2017-11-25 DIAGNOSIS — I5022 Chronic systolic (congestive) heart failure: Secondary | ICD-10-CM

## 2017-11-25 DIAGNOSIS — E7800 Pure hypercholesterolemia, unspecified: Secondary | ICD-10-CM | POA: Diagnosis not present

## 2017-11-25 DIAGNOSIS — I25118 Atherosclerotic heart disease of native coronary artery with other forms of angina pectoris: Secondary | ICD-10-CM

## 2017-11-25 DIAGNOSIS — E119 Type 2 diabetes mellitus without complications: Secondary | ICD-10-CM

## 2017-11-25 MED ORDER — NITROGLYCERIN 0.4 MG SL SUBL: 0.4000 mg | SUBLINGUAL_TABLET | SUBLINGUAL | 3 refills | Status: AC | PRN

## 2017-11-25 NOTE — Patient Instructions (Signed)
Take sublingual Ntg as needed for chest pain.   I will see you in 6 months  Continue  Your current therapy

## 2018-01-18 NOTE — Progress Notes (Signed)
Melinda Lewis Date of Birth: 1918-02-19 Medical Record #[id number]  History of Present Illness: Melinda Lewis is seen for evaluation of chest pain. She is a pleasant 84 yo WF with history of DM type 2, HTN, and hypercholesterolemia who was admitted from 8/16-8/19/15 with a NSTEMI. She presented with acute respiratory failure and was hypertensive.  By Echo EF was 35-40%. She underwent cardiac cath which demonstrated severe disease in the proximal and mid LAD and mid LCx. The RCA had an 80% stenosis in the mid vessel. She had stenting of the mid LCx, proximal and mid LAD with DES stents. Repeat Echo in Dec. 2015 showed normal EF. She was seen in September 2017 with atypical chest pain. Echo was unchanged.  She was admitted in December with symptoms of nausea and dizziness. CT and MRI of head unremarkable. She was hypertensive with no significant orthostasis. BP medication increased with increase in lisinopril and addition of amlodipine. She was seen in ED in January with atypical chest and abdominal pain. CT showed aortic atherosclerosis with SMA stenosis and moderate right iliac disease. Seen in follow up with Dr. Wyn Quaker with vascular surgery who felt she had no symptoms related to this disease and conservative management recommended.   She is seen today with her daughter. She really hasn't had any more significant chest pain. She states she will have fleeting pain that "just zips through". Lasts only a few seconds. Has not had to use Ntg.   Allergies as of 01/21/2018      Reactions   Cefuroxime Axetil Nausea Only   cramps   Clonidine Derivatives Other (See Comments)   Maker her pass out   Epinephrine    Penicillins    Septra [sulfamethoxazole-trimethoprim] Nausea And Vomiting      Medication List        Accurate as of 01/21/18  2:36 PM. Always use your most recent med list.          aspirin EC 81 MG tablet Take 81 mg by mouth daily.   atorvastatin 40 MG tablet Commonly known as:   LIPITOR TAKE 1 TABLET BY MOUTH DAILY AT 6PM   FLAX SEED OIL PO Take 1 tablet by mouth daily.   hydrALAZINE 50 MG tablet Commonly known as:  APRESOLINE Take 1 tablet (50 mg total) by mouth daily as needed (For Systolic BP>200).   L-Arginine 1000 MG Tabs Take 1 tablet by mouth 3 (three) times daily.   levothyroxine 100 MCG tablet Commonly known as:  SYNTHROID, LEVOTHROID TAKE 1 TABLET EVERY DAY ON EMPTY STOMACHWITH A GLASS OF WATER AT LEAST 30-60 MINBEFORE BREAKFAST   lisinopril 40 MG tablet Commonly known as:  PRINIVIL,ZESTRIL Take 1 tablet (40 mg total) by mouth daily.   magnesium oxide 400 MG tablet Commonly known as:  MAG-OX Take 400 mg by mouth 2 (two) times daily.   metFORMIN 500 MG tablet Commonly known as:  GLUCOPHAGE Take 500 mg by mouth daily.   metoprolol succinate 25 MG 24 hr tablet Commonly known as:  TOPROL-XL TAKE 1 TABLET BY MOUTH EVERY DAY   nitroGLYCERIN 0.4 MG SL tablet Commonly known as:  NITROSTAT Place 1 tablet (0.4 mg total) under the tongue every 5 (five) minutes as needed for chest pain.   pantoprazole 40 MG tablet Commonly known as:  PROTONIX Take 40 mg by mouth daily.   QUEtiapine 25 MG tablet Commonly known as:  SEROQUEL Take 1 tablet by mouth at bedtime.  Allergies  Allergen Reactions  . Cefuroxime Axetil Nausea Only    cramps  . Clonidine Derivatives Other (See Comments)    Maker her pass out  . Epinephrine   . Penicillins   . Septra [Sulfamethoxazole-Trimethoprim] Nausea And Vomiting    Past Medical History:  Diagnosis Date  . Acute on chronic systolic CHF (congestive heart failure) (HCC) 06/16/2014  . Anemia   . CAD (coronary artery disease) 8/15   s/p DES LCX and LAD   . Cardiomyopathy, ischemic 06/16/2014  . Diabetes mellitus without complication (HCC)   . Hypertension   . NSTEMI (non-ST elevated myocardial infarction) (HCC)   . Thyroid disease   . TIA (transient ischemic attack) 2010    Past Surgical  History:  Procedure Laterality Date  . LEFT HEART CATHETERIZATION WITH CORONARY ANGIOGRAM N/A 05/03/2014   Procedure: LEFT HEART CATHETERIZATION WITH CORONARY ANGIOGRAM;  Surgeon: Laniesha Das M [place], MD;  Location: [facility] CATH LAB;  Service: Cardiovascular;  Laterality: N/A;  . PERCUTANEOUS CORONARY STENT INTERVENTION (PCI-S) Right 05/03/2014   Procedure: PERCUTANEOUS CORONARY STENT INTERVENTION (PCI-S);  Surgeon: Talyia Allende M Swaz[place], MD;  Location: [facility] CATH LAB;  Service: Cardiovascular;  Laterality: Right;  . PERCUTANEOUS CORONARY STENT INTERVENTION (PCI-S) N/A 05/04/2014   Procedure: PERCUTANEOUS CORONARY STENT INTERVENTION (PCI-S);  Surgeon: Jodelle Fausto M [place], MD;  Location: [facility] CATH LAB;  Service: Cardiovascular;  Laterality: N/A;    Social History   Socioeconomic History  . Marital status: Widowed    Spouse name: Not on file  . Number of children: Not on file  . Years of education: Not on file  . Highest education level: Not on file  Occupational History  . Not on file  Social Needs  . Financial resource strain: Not on file  . Food insecurity:    Worry: Not on file    Inability: Not on file  . Transportation needs:    Medical: Not on file    Non-medical: Not on file  Tobacco Use  . Smoking status: Former Smoker    Types: Cigarettes  . Smokeless tobacco: Never Used  Substance and Sexual Activity  . Alcohol use: No  . Drug use: No  . Sexual activity: Not on file  Lifestyle  . Physical activity:    Days per week: Not on file    Minutes per session: Not on file  . Stress: Not on file  Relationships  . Social connections:    Talks on phone: Not on file    Gets together: Not on file    Attends religious service: Not on file    Active member of club or organization: Not on file    Attends meetings of clubs or organizations: Not on file    Relationship status: Not on file  Other Topics Concern  . Not on file  Social History Narrative  . Not on file    History reviewed. No pertinent  family history.  Review of Systems: As noted in HPI.  All other systems were reviewed and are negative.  Physical Exam: BP (!) 150/74 (BP Location: Left Arm, Patient Position: Sitting, Cuff Size: Normal)   Pulse 73   Ht 5\' 3"  (1.6 m)   Wt 161 lb 6.4 oz (73.2 kg)   BMI 28.59 kg/m  Filed Weights   01/21/18 1359  Weight: 161 lb 6.4 oz (73.2 kg)  GENERAL:  Well appearing elderly WF in NAD HEENT:  PERRL, EOMI, sclera are clear. Oropharynx is clear. NECK:  No jugular venous distention, carotid  upstroke brisk and symmetric, no bruits, no thyromegaly or adenopathy LUNGS:  Clear to auscultation bilaterally CHEST:  Unremarkable HEART:  RRR,  PMI not displaced or sustained,S1 and S2 within normal limits, no S3, no S4: no clicks, no rubs, no murmurs ABD:  Soft, nontender. BS +, no masses or bruits. No hepatomegaly, no splenomegaly EXT:  2 + pulses throughout, no edema, no cyanosis no clubbing SKIN:  Warm and dry.  No rashes NEURO:  Alert and oriented x 3. Cranial nerves II through XII intact. PSYCH:  Cognitively intact   LABORATORY DATA: Labs from primary care dated 09/05/16: CBC with Hgb 12.3 WBC 10.7, BUN 24, creatinine 1.1. Glucose 118. Other chemistries normal. A1c 6.6%. TSH 9.7. Cholesterol 148, trig- 150, HDL 41, LDL 77.  Dated 03/04/17: Hgb 10.8, WBC 12.4. Potassium 5.3. BUN 30, creatinine 1.1. Other chemistries normal. Glucose 98. Dated 10/21/17: glucose 180, A1c 7.1%. Otherwise CMET normal. Hgb 9.8. UA normal.  Dated 11/19/17: Hgb 10.2. WBC 11.3. BUN 32, creatinine 1.2. Otherwise chemistries normal.   Ecg today shows NSR rate 68. LAE. Otherwise normal. I have personally reviewed and interpreted this study.  Echo 06/14/16: Study Conclusions  - Left ventricle: The cavity size was normal. Wall thickness was   increased in a pattern of mild LVH. Systolic function was normal.   The estimated ejection fraction was in the range of 60% to 65%.   Wall motion was normal; there were no regional  wall motion   abnormalities. Doppler parameters are consistent with abnormal   left ventricular relaxation (grade 1 diastolic dysfunction). - Mitral valve: Calcified annulus. - Tricuspid valve: There was mild regurgitation. - Pulmonary arteries: Systolic pressure was mildly increased. PA   peak pressure: 39 mm Hg (S).  Impressions:  - Compared to the prior study, there has been no significant   interval change.  Assessment / Plan: 1. CAD - S/p complex stenting of the proximal and mid LAD, Mid LCx with DESs in August 2015. Moderate RCA disease that has been treated medically. When last seen she had a single episode of chest pain that resolved. No significant pain since.  Continue current therapy  2. Chronic systolic CHF due to ischemic cardiomyopathy. Volume status looks good. Continue ACEi, metoprolol, lasix.  Echo in September 2017 showed normal LV function. Continue sodium restriction.  3. Hypercholesterolemia. Well controlled on statin.  4. HTN well controlled.  5. DM type 2.   6. 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 Asymptomatic. Agree with conservative therapy.  Follow up in 6 months.

## 2018-01-21 ENCOUNTER — Encounter: Payer: Self-pay | Admitting: Cardiology

## 2018-01-21 ENCOUNTER — Ambulatory Visit: Payer: Medicare Other | Admitting: Cardiology

## 2018-01-21 VITALS — BP 150/74 | HR 73 | Ht 63.0 in | Wt 161.4 lb

## 2018-01-21 DIAGNOSIS — E7800 Pure hypercholesterolemia, unspecified: Secondary | ICD-10-CM

## 2018-01-21 DIAGNOSIS — I25118 Atherosclerotic heart disease of native coronary artery with other forms of angina pectoris: Secondary | ICD-10-CM

## 2018-01-21 DIAGNOSIS — I5022 Chronic systolic (congestive) heart failure: Secondary | ICD-10-CM | POA: Diagnosis not present

## 2018-01-21 DIAGNOSIS — I10 Essential (primary) hypertension: Secondary | ICD-10-CM | POA: Diagnosis not present

## 2018-01-21 NOTE — Patient Instructions (Signed)
Continue your current therapy  I will see you in 6 months.   

## 2018-02-09 IMAGING — RF DG UGI W/ SMALL BOWEL
14 of 24 series · 14 of 24 positions shown · non-contrast
Comparison: None.

CLINICAL DATA: UTI 2 weeks ago with abdominal pain. Abdominal pain
has resolved.

EXAM:
UPPER GI SERIES WITH SMALL BOWEL FOLLOW-THROUGH
FLUOROSCOPY TIME:  Fluoroscopy Time:  1 minutes 18 seconds
Radiation Exposure Index (if provided by the fluoroscopic device):
44.9 mGy
Number of Acquired Spot Images: 4
TECHNIQUE: Combined double contrast and single contrast upper GI series using
effervescent crystals, thick barium, and thin barium. Subsequently,
serial images of the small bowel were obtained including spot views
of the terminal ileum.

[Series 1: t abdomen supine · 0.15mm/px · 1 of 1 slices shown]
[im 1/1]
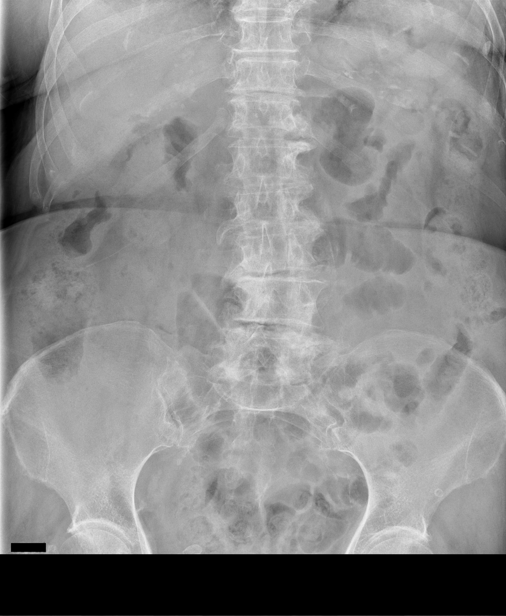

[Series 3: cp_standard · 0.28mm/px · 1 of 1 slices shown (1 of 11)]
[im 1/1]
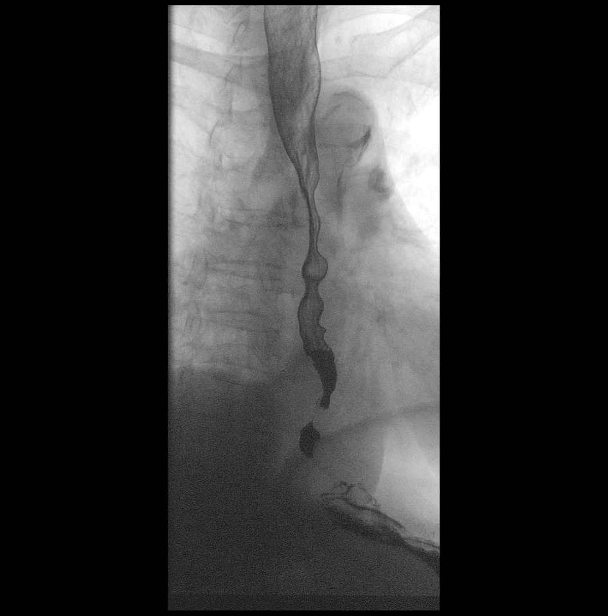

[Series 5: cp_standard · 0.28mm/px · 1 of 1 slices shown (2 of 11)]
[im 1/1]
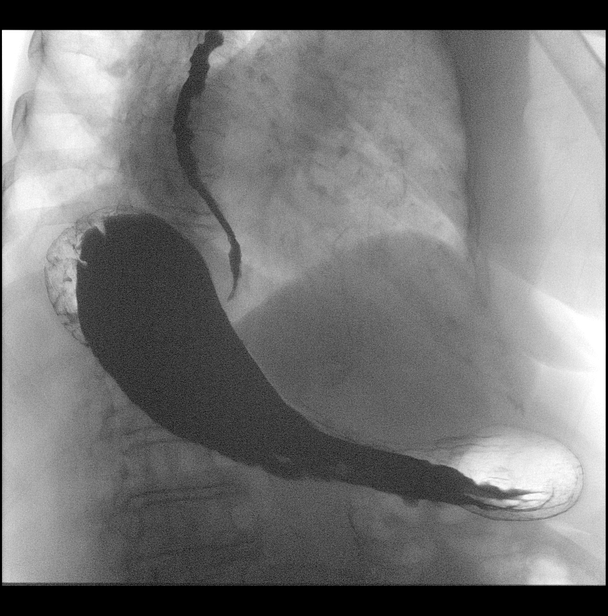

[Series 7: cp_standard · 0.28mm/px · 1 of 1 slices shown (3 of 11)]
[im 1/1]
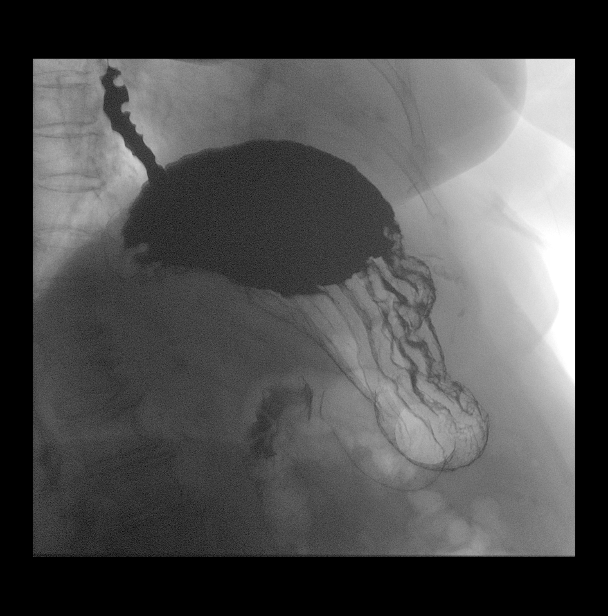

[Series 8: cp_standard · 0.28mm/px · 1 of 1 slices shown (4 of 11)]
[im 1/1]
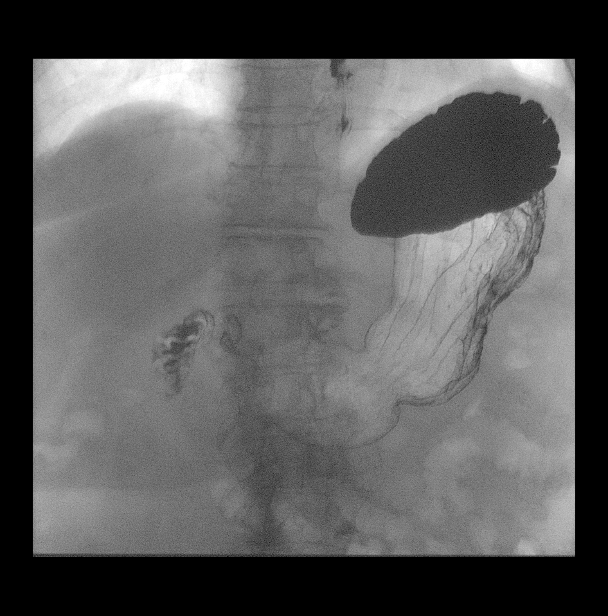

[Series 10: cp_standard · 0.28mm/px · 1 of 1 slices shown (5 of 11)]
[im 1/1]
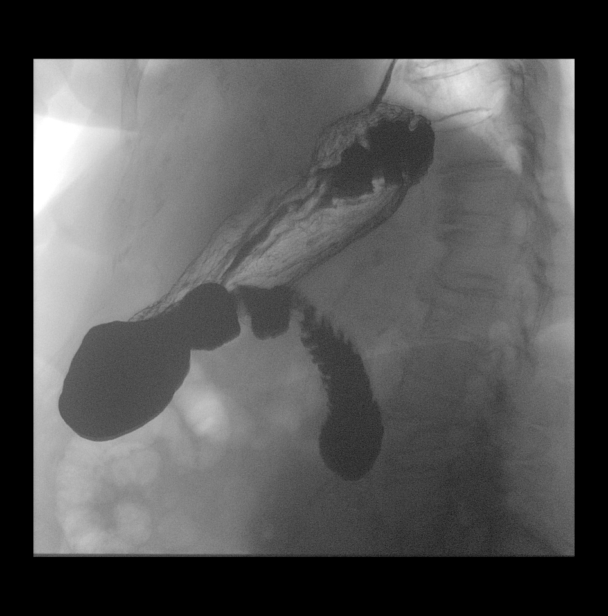

[Series 12: cp_standard · 0.28mm/px · 1 of 1 slices shown (6 of 11)]
[im 1/1]
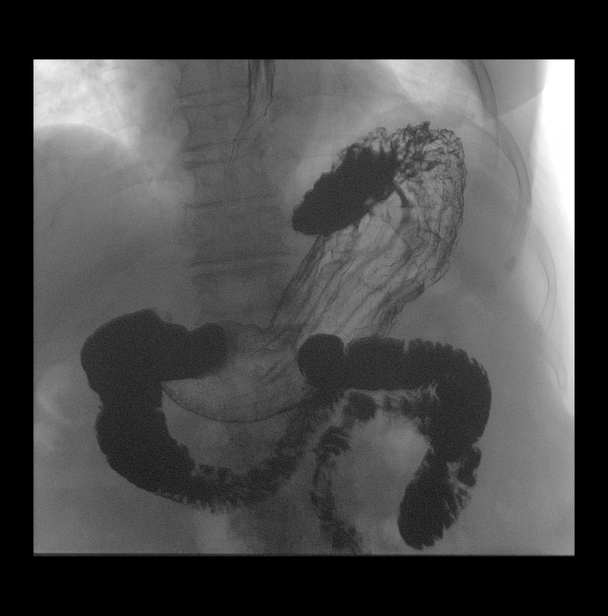

[Series 13: cp_standard · 0.28mm/px · 1 of 1 slices shown (7 of 11)]
[im 1/1]
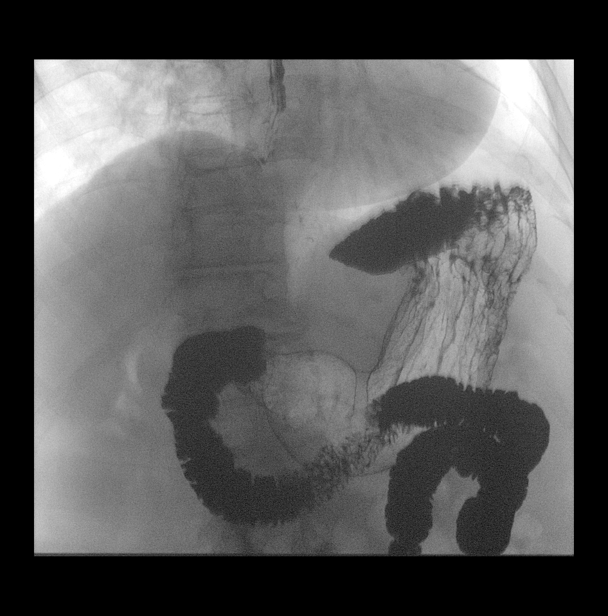

[Series 15: t abdomen barium ap · 0.15mm/px · 1 of 1 slices shown (1 of 2)]
[im 1/1]
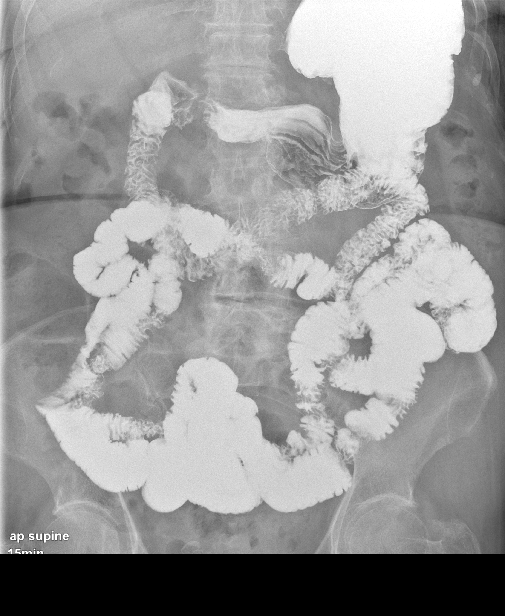

[Series 17: t abdomen barium ap · 0.15mm/px · 1 of 1 slices shown (2 of 2)]
[im 1/1]
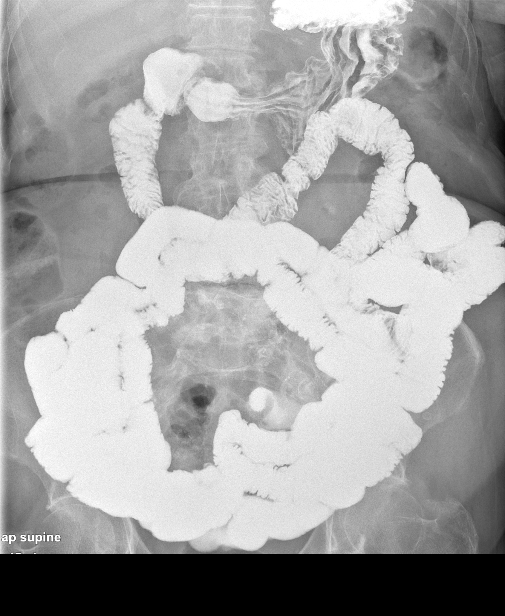

[Series 19: cp_standard · 0.27mm/px · 1 of 1 slices shown (8 of 11)]
[im 1/1]
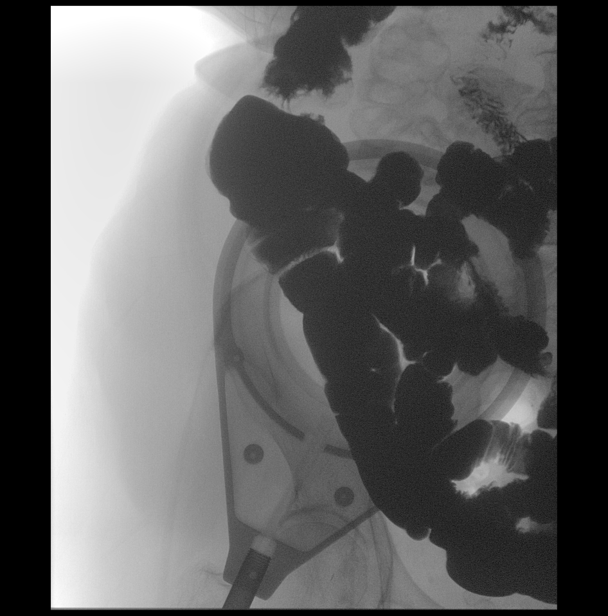

[Series 20: cp_standard · 0.27mm/px · 1 of 1 slices shown (9 of 11)]
[im 1/1]
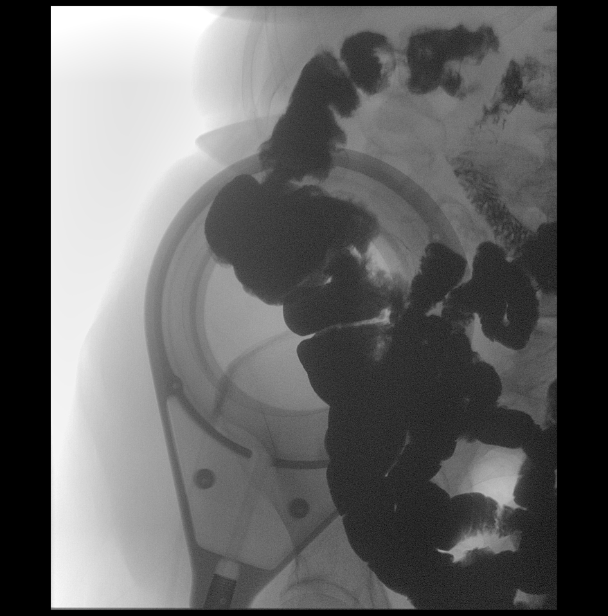

[Series 22: cp_standard · 0.26mm/px · 1 of 1 slices shown (10 of 11)]
[im 1/1]
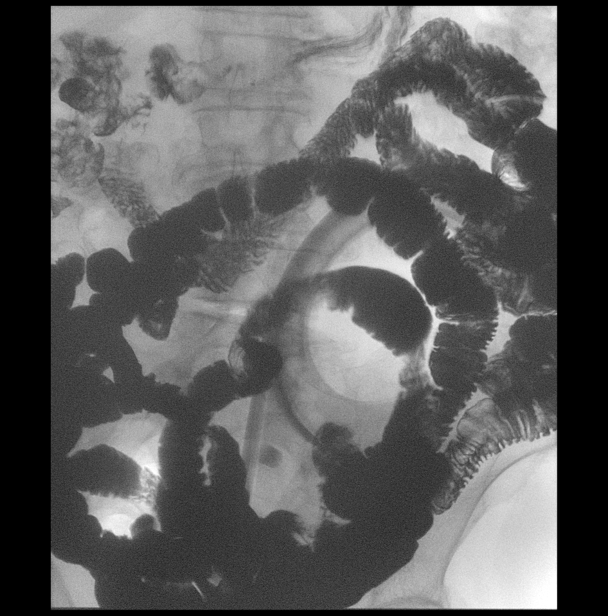

[Series 24: cp_standard · 0.26mm/px · 1 of 1 slices shown (11 of 11)]
[im 1/1]
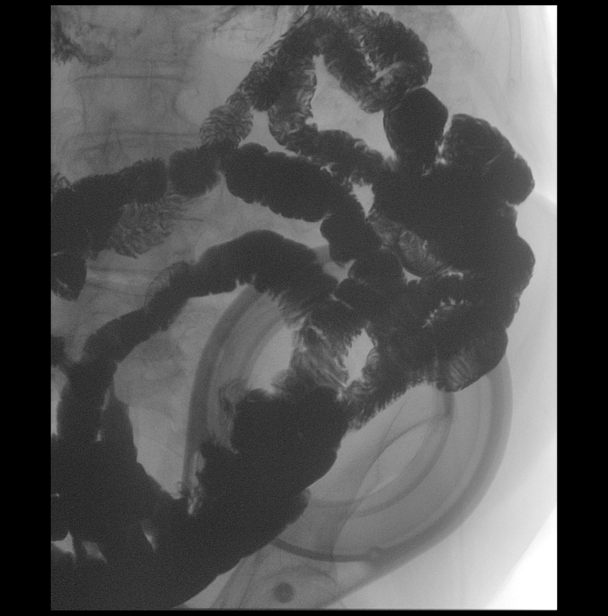

[14 of 24 positions shown; findings below may reference images not displayed]

FINDINGS: KUB:

There is no bowel dilatation to suggest obstruction. There is no
evidence of pneumoperitoneum, portal venous gas or pneumatosis.

There are no pathologic calcifications along the expected course of
the ureters.

Lower lumbar spine spondylosis.

UPPER GI SMALL BOWEL FOLLOW-THROUGH:

Examination of the esophagus demonstrated normal esophageal
motility. Normal esophageal morphology without evidence of
esophagitis or ulceration. No esophageal stricture, diverticula, or
mass lesion. Tertiary contractions of the distal third of the
esophagus concerning for mild esophageal spasm. No evidence of
hiatal hernia. There is mild gastroesophageal reflux.

Examination of the stomach demonstrated normal rugal folds and areae
gastricae. The gastric mucosa appeared unremarkable without evidence
of ulceration, scarring, or mass lesion. Gastric motility and
emptying was normal. Fluoroscopic examination of the duodenum
demonstrates normal motility and morphology without evidence of
ulceration or mass lesion.

Medium density barium was periodically observed under fluoroscopy to
travel from the stomach to the ascending colon (over a 75 minute
time period). There is no evidence of small bowel stricture or
obstruction. No large filling defects to suggest mass lesion. In
addition, there is no evidence of tethering or definite inflammatory
changes present within the small bowel.
IMPRESSION: 1. Tertiary contractions of the distal third of the esophagus
concerning for mild esophageal spasm.
2. Mild gastroesophageal reflux.
3. Otherwise normal upper GI.
4. Normal small bowel follow-through.

## 2018-03-14 ENCOUNTER — Other Ambulatory Visit: Payer: Self-pay | Admitting: Cardiology

## 2018-07-28 ENCOUNTER — Other Ambulatory Visit: Payer: Self-pay | Admitting: Acute Care

## 2018-07-28 DIAGNOSIS — R410 Disorientation, unspecified: Secondary | ICD-10-CM

## 2018-08-01 ENCOUNTER — Other Ambulatory Visit: Payer: Self-pay | Admitting: Cardiology

## 2018-08-13 ENCOUNTER — Ambulatory Visit
Admission: RE | Admit: 2018-08-13 | Discharge: 2018-08-13 | Disposition: A | Discharge status: Home / Self Care | Payer: Medicare Other | Source: Ambulatory Visit | Attending: Acute Care | Admitting: Acute Care

## 2018-08-13 DIAGNOSIS — I6782 Cerebral ischemia: Secondary | ICD-10-CM | POA: Diagnosis not present

## 2018-08-13 DIAGNOSIS — R410 Disorientation, unspecified: Secondary | ICD-10-CM

## 2018-08-26 NOTE — Progress Notes (Signed)
Melinda Lewis Date of Birth: 05-20-1918 Medical Record #[id number]  History of Present Illness: Melinda Lewis is seen for follow up CAD. She has a history of DM type 2, HTN, and hypercholesterolemia who was admitted from 8/16-8/19/15 with a NSTEMI. She presented with acute respiratory failure and was hypertensive.  By Echo EF was 35-40%. She underwent cardiac cath which demonstrated severe disease in the proximal and mid LAD and mid LCx. The RCA had an 80% stenosis in the mid vessel. She had stenting of the mid LCx, proximal and mid LAD with DES stents. Repeat Echo in Dec. 2015 showed normal EF. She was seen in September 2017 with atypical chest pain. Echo was unchanged.  She was admitted in December with symptoms of nausea and dizziness. CT and MRI of head unremarkable. She was hypertensive with no significant orthostasis. BP medication increased with increase in lisinopril and addition of amlodipine. She was seen in ED in January with atypical chest and abdominal pain. CT showed aortic atherosclerosis with SMA stenosis and moderate right iliac disease. Seen in follow up with Dr. Wyn Quaker with vascular surgery who felt she had no symptoms related to this disease and conservative management recommended.   She was seen by Neurology last month for evaluation of confusion and memory loss. MRI without acute change.   She is seen today with her son. She is feeling well. Son notes she gets tired easily. She still notes infrequent  Fleeting chest  pain that "just zips through". Lasts only a few seconds. Has not had to use Ntg. No dyspnea unless she has to walk far.   Allergies as of 08/29/2018      Reactions   Cefuroxime Axetil Nausea Only   cramps   Clonidine Derivatives Other (See Comments)   Maker her pass out   Epinephrine    Penicillins    Septra [sulfamethoxazole-trimethoprim] Nausea And Vomiting      Medication List        Accurate as of 08/26/18  5:56 PM. Always use your most recent med  list.          aspirin EC 81 MG tablet Take 81 mg by mouth daily.   atorvastatin 40 MG tablet Commonly known as:  LIPITOR TAKE 1 TABLET BY MOUTH DAILY AT 6PM   FLAX SEED OIL PO Take 1 tablet by mouth daily.   hydrALAZINE 50 MG tablet Commonly known as:  APRESOLINE Take 1 tablet (50 mg total) by mouth daily as needed (For Systolic BP>200).   L-Arginine 1000 MG Tabs Take 1 tablet by mouth 3 (three) times daily.   levothyroxine 100 MCG tablet Commonly known as:  SYNTHROID, LEVOTHROID TAKE 1 TABLET EVERY DAY ON EMPTY STOMACHWITH A GLASS OF WATER AT LEAST 30-60 MINBEFORE BREAKFAST   lisinopril 40 MG tablet Commonly known as:  PRINIVIL,ZESTRIL Take 1 tablet (40 mg total) by mouth daily.   magnesium oxide 400 MG tablet Commonly known as:  MAG-OX Take 400 mg by mouth 2 (two) times daily.   metFORMIN 500 MG tablet Commonly known as:  GLUCOPHAGE Take 500 mg by mouth daily.   metoprolol succinate 25 MG 24 hr tablet Commonly known as:  TOPROL-XL TAKE 1 TABLET BY MOUTH DAILY   nitroGLYCERIN 0.4 MG SL tablet Commonly known as:  NITROSTAT Place 1 tablet (0.4 mg total) under the tongue every 5 (five) minutes as needed for chest pain.   pantoprazole 40 MG tablet Commonly known as:  PROTONIX Take 40 mg by mouth daily.  QUEtiapine 25 MG tablet Commonly known as:  SEROQUEL Take 1 tablet by mouth at bedtime.        Allergies  Allergen Reactions  . Cefuroxime Axetil Nausea Only    cramps  . Clonidine Derivatives Other (See Comments)    Maker her pass out  . Epinephrine   . Penicillins   . Septra [Sulfamethoxazole-Trimethoprim] Nausea And Vomiting    Past Medical History:  Diagnosis Date  . Acute on chronic systolic CHF (congestive heart failure) (HCC) 06/16/2014  . Anemia   . CAD (coronary artery disease) 8/15   s/p DES LCX and LAD   . Cardiomyopathy, ischemic 06/16/2014  . Diabetes mellitus without complication (HCC)   . Hypertension   . NSTEMI (non-ST elevated  myocardial infarction) (HCC)   . Thyroid disease   . TIA (transient ischemic attack) 2010    Past Surgical History:  Procedure Laterality Date  . LEFT HEART CATHETERIZATION WITH CORONARY ANGIOGRAM N/A 05/03/2014   Procedure: LEFT HEART CATHETERIZATION WITH CORONARY ANGIOGRAM;  Surgeon:  M Swaziland, MD;  Location: [facility] CATH LAB;  Service: Cardiovascular;  Laterality: N/A;  . PERCUTANEOUS CORONARY STENT INTERVENTION (PCI-S) Right 05/03/2014   Procedure: PERCUTANEOUS CORONARY STENT INTERVENTION (PCI-S);  Surgeon:  M Swaziland, MD;  Location: [facility] CATH LAB;  Service: Cardiovascular;  Laterality: Right;  . PERCUTANEOUS CORONARY STENT INTERVENTION (PCI-S) N/A 05/04/2014   Procedure: PERCUTANEOUS CORONARY STENT INTERVENTION (PCI-S);  Surgeon:  M Swaziland, MD;  Location: [facility] CATH LAB;  Service: Cardiovascular;  Laterality: N/A;    Social History   Socioeconomic History  . Marital status: Widowed    Spouse name: Not on file  . Number of children: Not on file  . Years of education: Not on file  . Highest education level: Not on file  Occupational History  . Not on file  Social Needs  . Financial resource strain: Not on file  . Food insecurity:    Worry: Not on file    Inability: Not on file  . Transportation needs:    Medical: Not on file    Non-medical: Not on file  Tobacco Use  . Smoking status: Former Smoker    Types: Cigarettes  . Smokeless tobacco: Never Used  Substance and Sexual Activity  . Alcohol use: No  . Drug use: No  . Sexual activity: Not on file  Lifestyle  . Physical activity:    Days per week: Not on file    Minutes per session: Not on file  . Stress: Not on file  Relationships  . Social connections:    Talks on phone: Not on file    Gets together: Not on file    Attends religious service: Not on file    Active member of club or organization: Not on file    Attends meetings of clubs or organizations: Not on file    Relationship status: Not on file  Other  Topics Concern  . Not on file  Social History Narrative  . Not on file    No family history on file.  Review of Systems: As noted in HPI.  All other systems were reviewed and are negative.  Physical Exam: There were no vitals taken for this visit. There were no vitals filed for this visit.  GENERAL:  Well appearing elderly WF in NAD. Walks with a walker. HEENT:  PERRL, EOMI, sclera are clear. Oropharynx is clear. NECK:  No jugular venous distention, carotid upstroke brisk and symmetric, no bruits, no thyromegaly or adenopathy LUNGS:  Clear to auscultation bilaterally CHEST:  Unremarkable HEART:  RRR,  PMI not displaced or sustained,S1 and S2 within normal limits, no S3, no S4: no clicks, no rubs, no murmurs ABD:  Soft, nontender. BS +, no masses or bruits. No hepatomegaly, no splenomegaly EXT:  2 + pulses throughout, no edema, no cyanosis no clubbing SKIN:  Warm and dry.  No rashes NEURO:  Alert and oriented x 3. Cranial nerves II through XII intact. PSYCH:  Cognitively intact   LABORATORY DATA: Labs from primary care dated 09/05/16: CBC with Hgb 12.3 WBC 10.7, BUN 24, creatinine 1.1. Glucose 118. Other chemistries normal. A1c 6.6%. TSH 9.7. Cholesterol 148, trig- 150, HDL 41, LDL 77.  Dated 03/04/17: Hgb 10.8, WBC 12.4. Potassium 5.3. BUN 30, creatinine 1.1. Other chemistries normal. Glucose 98. Dated 10/21/17: glucose 180, A1c 7.1%. Otherwise CMET normal. Hgb 9.8. UA normal.  Dated 11/19/17: Hgb 10.2. WBC 11.3. BUN 32, creatinine 1.2. Otherwise chemistries normal.  Dated 06/05/18: Hgb 9.4. WBC 11.0. Glucose 165. Creatinine 1.1. Other chemistries normal.   Echo 06/14/16: Study Conclusions  - Left ventricle: The cavity size was normal. Wall thickness was   increased in a pattern of mild LVH. Systolic function was normal.   The estimated ejection fraction was in the range of 60% to 65%.   Wall motion was normal; there were no regional wall motion   abnormalities. Doppler parameters  are consistent with abnormal   left ventricular relaxation (grade 1 diastolic dysfunction). - Mitral valve: Calcified annulus. - Tricuspid valve: There was mild regurgitation. - Pulmonary arteries: Systolic pressure was mildly increased. PA   peak pressure: 39 mm Hg (S).  Impressions:  - Compared to the prior study, there has been no significant   interval change.  Assessment / Plan: 1. CAD - S/p complex stenting of the proximal and mid LAD, Mid LCx with DESs in August 2015. Moderate RCA disease that has been treated medically. She has no significant anginal symptoms.  Continue current therapy  2. Chronic systolic CHF due to ischemic cardiomyopathy. Volume status looks good. Continue ACEi, metoprolol, lasix.  Echo in September 2017 showed normal LV function. Continue sodium restriction.  3. Hypercholesterolemia. Well controlled on statin.  4. HTN mildly elevated today but readings with Dr Judithann Sheen and Neurology looked good.   5. DM type 2.   6. 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 Asymptomatic. Agree with conservative therapy.  Follow up in 6 months.

## 2018-08-29 ENCOUNTER — Encounter: Payer: Self-pay | Admitting: Cardiology

## 2018-08-29 ENCOUNTER — Ambulatory Visit: Payer: Medicare Other | Admitting: Cardiology

## 2018-08-29 VITALS — BP 154/68 | HR 78 | Ht 62.0 in | Wt 167.0 lb

## 2018-08-29 DIAGNOSIS — I10 Essential (primary) hypertension: Secondary | ICD-10-CM | POA: Diagnosis not present

## 2018-08-29 DIAGNOSIS — E7800 Pure hypercholesterolemia, unspecified: Secondary | ICD-10-CM | POA: Diagnosis not present

## 2018-08-29 DIAGNOSIS — I25118 Atherosclerotic heart disease of native coronary artery with other forms of angina pectoris: Secondary | ICD-10-CM | POA: Diagnosis not present

## 2018-08-29 DIAGNOSIS — I5022 Chronic systolic (congestive) heart failure: Secondary | ICD-10-CM | POA: Diagnosis not present

## 2018-09-05 ENCOUNTER — Inpatient Hospital Stay: Payer: Medicare Other

## 2018-09-05 ENCOUNTER — Other Ambulatory Visit: Payer: Self-pay

## 2018-09-05 ENCOUNTER — Encounter: Payer: Self-pay | Admitting: Emergency Medicine

## 2018-09-05 ENCOUNTER — Inpatient Hospital Stay
Admission: EM | Admit: 2018-09-05 | Discharge: 2018-09-07 | DRG: 872 | Disposition: A | Discharge status: Home / Self Care | Payer: Medicare Other | Attending: Internal Medicine | Admitting: Internal Medicine

## 2018-09-05 DIAGNOSIS — E872 Acidosis: Secondary | ICD-10-CM | POA: Diagnosis present

## 2018-09-05 DIAGNOSIS — E860 Dehydration: Secondary | ICD-10-CM | POA: Diagnosis not present

## 2018-09-05 DIAGNOSIS — I5022 Chronic systolic (congestive) heart failure: Secondary | ICD-10-CM | POA: Diagnosis not present

## 2018-09-05 DIAGNOSIS — E785 Hyperlipidemia, unspecified: Secondary | ICD-10-CM | POA: Diagnosis not present

## 2018-09-05 DIAGNOSIS — N179 Acute kidney failure, unspecified: Secondary | ICD-10-CM | POA: Diagnosis present

## 2018-09-05 DIAGNOSIS — Z66 Do not resuscitate: Secondary | ICD-10-CM | POA: Diagnosis present

## 2018-09-05 DIAGNOSIS — A419 Sepsis, unspecified organism: Secondary | ICD-10-CM | POA: Diagnosis not present

## 2018-09-05 DIAGNOSIS — Z888 Allergy status to other drugs, medicaments and biological substances status: Secondary | ICD-10-CM

## 2018-09-05 DIAGNOSIS — I255 Ischemic cardiomyopathy: Secondary | ICD-10-CM | POA: Diagnosis not present

## 2018-09-05 DIAGNOSIS — Z7982 Long term (current) use of aspirin: Secondary | ICD-10-CM | POA: Diagnosis not present

## 2018-09-05 DIAGNOSIS — Z87891 Personal history of nicotine dependence: Secondary | ICD-10-CM

## 2018-09-05 DIAGNOSIS — I2510 Atherosclerotic heart disease of native coronary artery without angina pectoris: Secondary | ICD-10-CM | POA: Diagnosis not present

## 2018-09-05 DIAGNOSIS — Z7951 Long term (current) use of inhaled steroids: Secondary | ICD-10-CM

## 2018-09-05 DIAGNOSIS — I700 Atherosclerosis of aorta: Secondary | ICD-10-CM | POA: Diagnosis present

## 2018-09-05 DIAGNOSIS — K529 Noninfective gastroenteritis and colitis, unspecified: Secondary | ICD-10-CM | POA: Diagnosis present

## 2018-09-05 DIAGNOSIS — Z8673 Personal history of transient ischemic attack (TIA), and cerebral infarction without residual deficits: Secondary | ICD-10-CM

## 2018-09-05 DIAGNOSIS — E039 Hypothyroidism, unspecified: Secondary | ICD-10-CM | POA: Diagnosis not present

## 2018-09-05 DIAGNOSIS — Z79899 Other long term (current) drug therapy: Secondary | ICD-10-CM

## 2018-09-05 DIAGNOSIS — E119 Type 2 diabetes mellitus without complications: Secondary | ICD-10-CM | POA: Diagnosis not present

## 2018-09-05 DIAGNOSIS — Z880 Allergy status to penicillin: Secondary | ICD-10-CM

## 2018-09-05 DIAGNOSIS — Z7984 Long term (current) use of oral hypoglycemic drugs: Secondary | ICD-10-CM

## 2018-09-05 DIAGNOSIS — I110 Hypertensive heart disease with heart failure: Secondary | ICD-10-CM | POA: Diagnosis present

## 2018-09-05 DIAGNOSIS — I252 Old myocardial infarction: Secondary | ICD-10-CM | POA: Diagnosis not present

## 2018-09-05 DIAGNOSIS — Z955 Presence of coronary angioplasty implant and graft: Secondary | ICD-10-CM | POA: Diagnosis not present

## 2018-09-05 LAB — CBC
HEMATOCRIT: 37.7 % (ref 36.0–46.0)
Hemoglobin: 11.4 g/dL — ABNORMAL LOW (ref 12.0–15.0)
MCH: 24.6 pg — ABNORMAL LOW (ref 26.0–34.0)
MCHC: 30.2 g/dL (ref 30.0–36.0)
MCV: 81.4 fL (ref 80.0–100.0)
NRBC: 0 % (ref 0.0–0.2)
Platelets: 384 10*3/uL (ref 150–400)
RBC: 4.63 MIL/uL (ref 3.87–5.11)
RDW: 16.3 % — AB (ref 11.5–15.5)
WBC: 25.3 10*3/uL — AB (ref 4.0–10.5)

## 2018-09-05 LAB — COMPREHENSIVE METABOLIC PANEL
ALT: 17 U/L (ref 0–44)
AST: 26 U/L (ref 15–41)
Albumin: 3.8 g/dL (ref 3.5–5.0)
Alkaline Phosphatase: 60 U/L (ref 38–126)
Anion gap: 12 (ref 5–15)
BUN: 30 mg/dL — ABNORMAL HIGH (ref 8–23)
CHLORIDE: 101 mmol/L (ref 98–111)
CO2: 22 mmol/L (ref 22–32)
CREATININE: 1.3 mg/dL — AB (ref 0.44–1.00)
Calcium: 9.4 mg/dL (ref 8.9–10.3)
GFR, EST AFRICAN AMERICAN: 40 mL/min — AB (ref >60)
GFR, EST NON AFRICAN AMERICAN: 34 mL/min — AB (ref >60)
Glucose, Bld: 233 mg/dL — ABNORMAL HIGH (ref 70–99)
Potassium: 4.4 mmol/L (ref 3.5–5.1)
Sodium: 135 mmol/L (ref 135–145)
TOTAL PROTEIN: 7.6 g/dL (ref 6.5–8.1)
Total Bilirubin: 0.6 mg/dL (ref 0.3–1.2)

## 2018-09-05 LAB — LIPASE, BLOOD: LIPASE: 28 U/L (ref 11–51)

## 2018-09-05 LAB — CG4 I-STAT (LACTIC ACID): Lactic Acid, Venous: 3.26 mmol/L (ref 0.5–1.9)

## 2018-09-05 LAB — TSH: TSH: 0.351 u[IU]/mL (ref 0.350–4.500)

## 2018-09-05 LAB — GLUCOSE, CAPILLARY: Glucose-Capillary: 190 mg/dL — ABNORMAL HIGH (ref 70–99)

## 2018-09-05 LAB — LACTIC ACID, PLASMA: Lactic Acid, Venous: 4.9 mmol/L (ref 0.5–1.9)

## 2018-09-05 MED ORDER — ACETAMINOPHEN 650 MG RE SUPP: 650.0000 mg | Freq: Four times a day (QID) | RECTAL | Status: DC | PRN

## 2018-09-05 MED ORDER — LISINOPRIL 20 MG PO TABS
40.0000 mg | ORAL_TABLET | Freq: Every day | ORAL | Status: DC
  Administered 2018-09-06 – 2018-09-07 (×2): 40 mg via ORAL
  Filled 2018-09-05 (×2): qty 2

## 2018-09-05 MED ORDER — QUETIAPINE FUMARATE 25 MG PO TABS
25.0000 mg | ORAL_TABLET | Freq: Every day | ORAL | Status: DC
  Administered 2018-09-05 – 2018-09-06 (×2): 25 mg via ORAL
  Filled 2018-09-05 (×2): qty 1

## 2018-09-05 MED ORDER — HEPARIN SODIUM (PORCINE) 5000 UNIT/ML IJ SOLN
5000.0000 [IU] | Freq: Three times a day (TID) | INTRAMUSCULAR | Status: DC
  Administered 2018-09-05 – 2018-09-07 (×5): 5000 [IU] via SUBCUTANEOUS
  Filled 2018-09-05 (×5): qty 1

## 2018-09-05 MED ORDER — SODIUM CHLORIDE 0.9 % IV BOLUS: 1000.0000 mL | Freq: Once | INTRAVENOUS | Status: DC

## 2018-09-05 MED ORDER — INSULIN ASPART 100 UNIT/ML ~~LOC~~ SOLN: 0.0000 [IU] | Freq: Every day | SUBCUTANEOUS | Status: DC

## 2018-09-05 MED ORDER — SODIUM CHLORIDE 0.9 % IV SOLN
INTRAVENOUS | Status: AC
  Administered 2018-09-05: 23:00:00 via INTRAVENOUS

## 2018-09-05 MED ORDER — ONDANSETRON HCL 4 MG/2ML IJ SOLN
4.0000 mg | Freq: Once | INTRAMUSCULAR | Status: AC
  Administered 2018-09-05: 4 mg via INTRAVENOUS
  Filled 2018-09-05: qty 2

## 2018-09-05 MED ORDER — ATORVASTATIN CALCIUM 20 MG PO TABS
40.0000 mg | ORAL_TABLET | Freq: Every day | ORAL | Status: DC
  Administered 2018-09-06: 40 mg via ORAL
  Filled 2018-09-05: qty 2

## 2018-09-05 MED ORDER — PANTOPRAZOLE SODIUM 40 MG PO TBEC
40.0000 mg | DELAYED_RELEASE_TABLET | Freq: Every day | ORAL | Status: DC
  Administered 2018-09-06 – 2018-09-07 (×2): 40 mg via ORAL
  Filled 2018-09-05 (×2): qty 1

## 2018-09-05 MED ORDER — NITROGLYCERIN 0.4 MG SL SUBL: 0.4000 mg | SUBLINGUAL_TABLET | SUBLINGUAL | Status: DC | PRN

## 2018-09-05 MED ORDER — LABETALOL HCL 5 MG/ML IV SOLN: 5.0000 mg | INTRAVENOUS | Status: DC | PRN

## 2018-09-05 MED ORDER — SODIUM CHLORIDE 0.9 % IV BOLUS
500.0000 mL | Freq: Once | INTRAVENOUS | Status: AC
  Administered 2018-09-05: 500 mL via INTRAVENOUS

## 2018-09-05 MED ORDER — METRONIDAZOLE IN NACL 5-0.79 MG/ML-% IV SOLN
500.0000 mg | Freq: Three times a day (TID) | INTRAVENOUS | Status: DC
  Administered 2018-09-06 – 2018-09-07 (×5): 500 mg via INTRAVENOUS
  Filled 2018-09-05 (×7): qty 100

## 2018-09-05 MED ORDER — METOPROLOL SUCCINATE ER 25 MG PO TB24
25.0000 mg | ORAL_TABLET | Freq: Every day | ORAL | Status: DC
  Administered 2018-09-06 – 2018-09-07 (×2): 25 mg via ORAL
  Filled 2018-09-05 (×2): qty 1

## 2018-09-05 MED ORDER — ACETAMINOPHEN 325 MG PO TABS: 650.0000 mg | ORAL_TABLET | Freq: Four times a day (QID) | ORAL | Status: DC | PRN

## 2018-09-05 MED ORDER — ASPIRIN EC 81 MG PO TBEC
81.0000 mg | DELAYED_RELEASE_TABLET | Freq: Every day | ORAL | Status: DC
  Administered 2018-09-06 – 2018-09-07 (×2): 81 mg via ORAL
  Filled 2018-09-05 (×2): qty 1

## 2018-09-05 MED ORDER — LEVOTHYROXINE SODIUM 100 MCG PO TABS
100.0000 ug | ORAL_TABLET | Freq: Every day | ORAL | Status: DC
  Administered 2018-09-06 – 2018-09-07 (×2): 100 ug via ORAL
  Filled 2018-09-05 (×2): qty 1

## 2018-09-05 MED ORDER — MAGNESIUM OXIDE 400 (241.3 MG) MG PO TABS
400.0000 mg | ORAL_TABLET | Freq: Every day | ORAL | Status: DC
  Administered 2018-09-06 – 2018-09-07 (×2): 400 mg via ORAL
  Filled 2018-09-05 (×2): qty 1

## 2018-09-05 MED ORDER — ONDANSETRON HCL 4 MG/2ML IJ SOLN: 4.0000 mg | Freq: Four times a day (QID) | INTRAMUSCULAR | Status: DC | PRN

## 2018-09-05 MED ORDER — L-ARGININE 1000 MG PO TABS: 1.0000 | ORAL_TABLET | Freq: Three times a day (TID) | ORAL | Status: DC

## 2018-09-05 MED ORDER — SODIUM CHLORIDE 0.9 % IV SOLN: Freq: Once | INTRAVENOUS | Status: DC

## 2018-09-05 MED ORDER — INSULIN ASPART 100 UNIT/ML ~~LOC~~ SOLN
0.0000 [IU] | Freq: Three times a day (TID) | SUBCUTANEOUS | Status: DC
  Administered 2018-09-06 (×2): 2 [IU] via SUBCUTANEOUS
  Filled 2018-09-05 (×3): qty 1

## 2018-09-05 MED ORDER — FLAX SEED OIL 1000 MG PO CAPS: 1.0000 | ORAL_CAPSULE | Freq: Every day | ORAL | Status: DC

## 2018-09-05 MED ORDER — ONDANSETRON HCL 4 MG PO TABS: 4.0000 mg | ORAL_TABLET | Freq: Four times a day (QID) | ORAL | Status: DC | PRN

## 2018-09-05 MED ORDER — SODIUM CHLORIDE 0.9 % IV SOLN
1.0000 g | INTRAVENOUS | Status: DC
  Administered 2018-09-05 – 2018-09-06 (×2): 1 g via INTRAVENOUS
  Filled 2018-09-05: qty 1
  Filled 2018-09-05: qty 10
  Filled 2018-09-05: qty 1

## 2018-09-05 MED ORDER — HYDRALAZINE HCL 50 MG PO TABS: 50.0000 mg | ORAL_TABLET | Freq: Every day | ORAL | Status: DC | PRN

## 2018-09-05 NOTE — ED Notes (Signed)
Attempt to call report, no nurse available at this time.

## 2018-09-05 NOTE — ED Notes (Signed)
Attempt to call report, pt is to be moved to another floor.

## 2018-09-05 NOTE — ED Notes (Signed)
Dr. Diamond in to see pt.  

## 2018-09-05 NOTE — ED Provider Notes (Addendum)
[facility] Emergency Department Provider Note  ____________________________________________   I have reviewed the triage vital signs and the nursing notes. Where available I have reviewed prior notes and, if possible and indicated, outside hospital notes.    HISTORY  Chief Complaint Nausea and Emesis    HPI Melinda Lewis is a 84 y.o. female with history of cardiomyopathy, hypertension thyroid disease, presents today complaining of nausea vomiting diarrhea.  Nonbloody nonbilious vomiting, no melena, but innumerable amounts of watery diarrhea and vomiting.  This is been going on since this morning.  No abdominal pain that she is in the physical active vomiting.  No fever no chills.  She states that she just feels depleted and worn out.  She has not fallen, no recent travel no recent antibiotics no recent sick contacts nothing makes it better nothing makes it worse no other antecedent symptoms or treatments.    Past Medical History:  Diagnosis Date  . Acute on chronic systolic CHF (congestive heart failure) (HCC) 06/16/2014  . Anemia   . CAD (coronary artery disease) 8/15   s/p DES LCX and LAD   . Cardiomyopathy, ischemic 06/16/2014  . Diabetes mellitus without complication (HCC)   . Hypertension   . NSTEMI (non-ST elevated myocardial infarction) (HCC)   . Thyroid disease   . TIA (transient ischemic attack) 2010    Patient Active Problem List   Diagnosis Date Noted  . Diabetes mellitus without complication (HCC) 10/22/2017  . 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 Aortic atherosclerosis (HCC) 10/22/2017  . Carotid disease, bilateral (HCC) 10/22/2017  . Uncontrolled hypertension 09/09/2017  . Nausea & vomiting 09/08/2017  . Chronic systolic CHF (congestive heart failure) (HCC) 03/04/2015  . Acute on chronic systolic CHF (congestive heart failure) (HCC) 06/16/2014  . Cardiomyopathy, ischemic 06/16/2014  . CAD, multiple vessel 06/15/2014  . Anemia 05/05/2014   . Hypothyroidism 05/05/2014  . 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/05/2014  . NSTEMI (non-ST elevated myocardial infarction) (HCC) 05/02/2014  . Hypertension 05/02/2014  . Pulmonary edema cardiac cause (HCC) 05/02/2014    Past Surgical History:  Procedure Laterality Date  . LEFT HEART CATHETERIZATION WITH CORONARY ANGIOGRAM N/A 05/03/2014   Procedure: LEFT HEART CATHETERIZATION WITH CORONARY ANGIOGRAM;  Surgeon: Peter M Swaziland, MD;  Location: [facility] CATH LAB;  Service: Cardiovascular;  Laterality: N/A;  . PERCUTANEOUS CORONARY STENT INTERVENTION (PCI-S) Right 05/03/2014   Procedure: PERCUTANEOUS CORONARY STENT INTERVENTION (PCI-S);  Surgeon: Peter M Swaziland, MD;  Location: [facility] CATH LAB;  Service: Cardiovascular;  Laterality: Right;  . PERCUTANEOUS CORONARY STENT INTERVENTION (PCI-S) N/A 05/04/2014   Procedure: PERCUTANEOUS CORONARY STENT INTERVENTION (PCI-S);  Surgeon: Peter M Swaziland, MD;  Location: [facility] CATH LAB;  Service: Cardiovascular;  Laterality: N/A;    Prior to Admission medications   Medication Sig Start Date End Date Taking? Authorizing Provider  aspirin EC 81 MG tablet Take 81 mg by mouth daily.    [provider]  atorvastatin (LIPITOR) 40 MG tablet TAKE 1 TABLET BY MOUTH DAILY AT 6PM 08/01/18   Swaziland, Peter M, MD  Flaxseed, Linseed, (FLAX SEED OIL PO) Take 1 tablet by mouth daily.    [provider]  hydrALAZINE (APRESOLINE) 50 MG tablet Take 1 tablet (50 mg total) by mouth daily as needed (For Systolic BP>200). 09/12/17 09/12/18  Milagros Loll, MD  L-Arginine 1000 MG TABS Take 1 tablet by mouth 3 (three) times daily.    [provider]  levothyroxine (SYNTHROID, LEVOTHROID) 100 MCG tablet TAKE 1 TABLET EVERY  DAY ON EMPTY STOMACHWITH A GLASS OF WATER AT LEAST 30-60 MINBEFORE BREAKFAST 02/09/17   [provider]  lisinopril (PRINIVIL,ZESTRIL) 40 MG tablet Take 1 tablet (40 mg total) by mouth daily. 09/13/17   Milagros LollSudini, Srikar, MD  magnesium oxide  (MAG-OX) 400 MG tablet Take 400 mg by mouth 2 (two) times daily.    [provider]  metFORMIN (GLUCOPHAGE) 500 MG tablet Take 500 mg by mouth daily.     [provider]  metoprolol succinate (TOPROL-XL) 25 MG 24 hr tablet TAKE 1 TABLET BY MOUTH DAILY 03/17/18   SwazilandJordan, Peter M, MD  nitroGLYCERIN (NITROSTAT) 0.4 MG SL tablet Place 1 tablet (0.4 mg total) under the tongue every 5 (five) minutes as needed for chest pain. 11/25/17 02/23/18  SwazilandJordan, Peter M, MD  pantoprazole (PROTONIX) 40 MG tablet Take 40 mg by mouth daily.    [provider]  QUEtiapine (SEROQUEL) 25 MG tablet Take 1 tablet by mouth at bedtime. 12/17/16   [provider]    Allergies Cefuroxime axetil; Clonidine derivatives; Epinephrine; Penicillins; and Septra [sulfamethoxazole-trimethoprim]  No family history on file.  Social History Social History   Tobacco Use  . Smoking status: Former Smoker    Types: Cigarettes  . Smokeless tobacco: Never Used  Substance Use Topics  . Alcohol use: No  . Drug use: No    Review of Systems Constitutional: No fever/chills Eyes: No visual changes. ENT: No sore throat. No stiff neck no neck pain Cardiovascular: Denies chest pain. Respiratory: Denies shortness of breath. Gastrointestinal: HPI Genitourinary: Negative for dysuria. Musculoskeletal: Negative lower extremity swelling Skin: Negative for rash. Neurological: Negative for severe headaches, focal weakness or numbness.   ____________________________________________   PHYSICAL EXAM:  VITAL SIGNS: ED Triage Vitals  Enc Vitals Group     BP 09/05/18 1640 (!) 137/43     Pulse Rate 09/05/18 1640 97     Resp 09/05/18 1640 16     Temp 09/05/18 1640 98 F (36.7 C)     Temp Source 09/05/18 1640 Oral     SpO2 09/05/18 1640 95 %     Weight --      Height --      Head Circumference --      Peak Flow --      Pain Score 09/05/18 1642 7     Pain Loc --      Pain Edu? --      Excl. in GC? --      Constitutional: Alert and oriented. Well appearing and in no acute distress. Eyes: Conjunctivae are normal Head: Atraumatic HEENT: No congestion/rhinnorhea. Mucous membranes are dry.  Oropharynx non-erythematous Neck:   Nontender with no meningismus, no masses, no stridor Cardiovascular: Normal rate, regular rhythm. Grossly normal heart sounds.  Good peripheral circulation. Respiratory: Normal respiratory effort.  No retractions. Lungs CTAB. Abdominal: Soft and nontender. No distention. No guarding no rebound Back:  There is no focal tenderness or step off.  there is no midline tenderness there are no lesions noted. there is no CVA tenderness Musculoskeletal: No lower extremity tenderness, no upper extremity tenderness. No joint effusions, no DVT signs strong distal pulses no edema Neurologic:  Normal speech and language. No gross focal neurologic deficits are appreciated.  Skin:  Skin is warm, dry and intact. No rash noted. Psychiatric: Mood and affect are normal. Speech and behavior are normal.  ____________________________________________   LABS (all labs ordered are listed, but only abnormal results are displayed)  Labs Reviewed  COMPREHENSIVE METABOLIC PANEL - Abnormal; Notable for the following components:      Result Value   Glucose, Bld 233 (*)    BUN 30 (*)    Creatinine, Ser 1.30 (*)    GFR calc non Af Amer 34 (*)    GFR calc Af Amer 40 (*)    All other components within normal limits  CBC - Abnormal; Notable for the following components:   WBC 25.3 (*)    Hemoglobin 11.4 (*)    MCH 24.6 (*)    RDW 16.3 (*)    All other components within normal limits  GASTROINTESTINAL PANEL BY PCR, STOOL (REPLACES STOOL CULTURE)  C DIFFICILE QUICK SCREEN W PCR REFLEX  LIPASE, BLOOD  URINALYSIS, COMPLETE (UACMP) WITH MICROSCOPIC    Pertinent labs  results that were available during my care of the patient were reviewed by me and considered in my medical decision making (see  chart for details). ____________________________________________  EKG  I personally interpreted any EKGs ordered by me or triage  ____________________________________________  RADIOLOGY  Pertinent labs & imaging results that were available during my care of the patient were reviewed by me and considered in my medical decision making (see chart for details). If possible, patient and/or family made aware of any abnormal findings.  No results found. ____________________________________________    PROCEDURES  Procedure(s) performed: None  Procedures  Critical Care performed: None  ____________________________________________   INITIAL IMPRESSION / ASSESSMENT AND PLAN / ED COURSE  Pertinent labs & imaging results that were available during my care of the patient were reviewed by me and considered in my medical decision making (see chart for details).  84 year old woman who is unremarkably good health for her age presents today with nausea vomiting diarrhea, she does have a leukocytosis which is impressive but her abdomen is absolutely nontender and I do not think CT scan is indicated.  This is most likely a viral illness.  Even without white count.  We are giving her IV fluid, antiemetics, and I believe patient given her symptoms and generalized weakness would benefit from admission  ----------------------------------------- 8:22 PM on 09/05/2018 -----------------------------------------  Exam exams are reassuring, however, her lactic is elevated somewhat, I do not think she is septic although the possibility exists I certainly do not want to give her antibiotics without any kind of source as all she is complaining of his diarrhea.  Nonetheless given this constellation of signs we will obtain imaging of her abdomen pelvis as a precaution.  She has been admitted to the hospitalist service.  I will also give her IV fluids but patient takes 80 of Lasix a day, and at this time I do not  think it is in her best interest to be flooded with fluid as I think at age 39 that will cause her more problems therefore I will do a stepwise approach to hydration.  Do not believe she is septic at this moment as she appears clniically    ____________________________________________   FINAL CLINICAL IMPRESSION(S) / ED DIAGNOSES  Final diagnoses:  None      This chart was dictated using voice recognition software.  Despite best efforts to proofread,  errors can occur which can change meaning.      Jeanmarie Plant, MD 09/05/18 Ebony Cargo    Jeanmarie Plant, MD 09/05/18 2023

## 2018-09-05 NOTE — H&P (Addendum)
Melinda Lewis is an 84 y.o. female.   Chief Complaint: Diarrhea HPI: This is a 84 year old female with a past medical history of ischemic cardiomyopathy following an STEMI, hypothyroidism, diabetes, hypertension and history of TIAs who presents to the emergency department complaining of diarrhea.  The patient started having loose stools today.  They have been nonbloody.  She is also had a few episodes of nonbloody nonbilious emesis.  This all started after a pot luck [place] dinner last night.  Laboratory evaluation revealed leukocytosis.  CT of the patient's abdomen showed no areas of diverticulitis or ischemic bowel.  The patient's abdomen does not hurt at this time.  She is afebrile and stable but dehydrated with some acute kidney injury.  Due to the aforementioned abnormalities the emergency department staff called the hospitalist service for admission.  Past Medical History:  Diagnosis Date  . Acute on chronic systolic CHF (congestive heart failure) (HCC) 06/16/2014  . Anemia   . CAD (coronary artery disease) 8/15   s/p DES LCX and LAD   . Cardiomyopathy, ischemic 06/16/2014  . Diabetes mellitus without complication (HCC)   . Hypertension   . NSTEMI (non-ST elevated myocardial infarction) (HCC)   . Thyroid disease   . TIA (transient ischemic attack) 2010    Past Surgical History:  Procedure Laterality Date  . LEFT HEART CATHETERIZATION WITH CORONARY ANGIOGRAM N/A 05/03/2014   Procedure: LEFT HEART CATHETERIZATION WITH CORONARY ANGIOGRAM;  Surgeon: Peter M Swaziland, MD;  Location: [facility] CATH LAB;  Service: Cardiovascular;  Laterality: N/A;  . PERCUTANEOUS CORONARY STENT INTERVENTION (PCI-S) Right 05/03/2014   Procedure: PERCUTANEOUS CORONARY STENT INTERVENTION (PCI-S);  Surgeon: Peter M Swaziland, MD;  Location: [facility] CATH LAB;  Service: Cardiovascular;  Laterality: Right;  . PERCUTANEOUS CORONARY STENT INTERVENTION (PCI-S) N/A 05/04/2014   Procedure: PERCUTANEOUS CORONARY STENT INTERVENTION (PCI-S);   Surgeon: Peter M Swaziland, MD;  Location: [facility] CATH LAB;  Service: Cardiovascular;  Laterality: N/A;    No family history on file. Heart disease Social History:  reports that she has quit smoking. Her smoking use included cigarettes. She has never used smokeless tobacco. She reports that she does not drink alcohol or use drugs.  Allergies:  Allergies  Allergen Reactions  . Cefuroxime Axetil Nausea Only    cramps  . Clonidine Derivatives Other (See Comments)    Maker her pass out  . Epinephrine   . Penicillins   . Septra [Sulfamethoxazole-Trimethoprim] Nausea And Vomiting    Medications Prior to Admission  Medication Sig Dispense Refill  . aspirin EC 81 MG tablet Take 81 mg by mouth daily.    Marland Kitchen atorvastatin (LIPITOR) 40 MG tablet TAKE 1 TABLET BY MOUTH DAILY AT 6PM 90 tablet 2  . Flaxseed, Linseed, (FLAX SEED OIL PO) Take 1 tablet by mouth daily.    . hydrALAZINE (APRESOLINE) 50 MG tablet Take 1 tablet (50 mg total) by mouth daily as needed (For Systolic BP>200). 30 tablet 0  . L-Arginine 1000 MG TABS Take 1 tablet by mouth 3 (three) times daily.    Marland Kitchen levothyroxine (SYNTHROID, LEVOTHROID) 100 MCG tablet TAKE 1 TABLET EVERY DAY ON EMPTY STOMACHWITH A GLASS OF WATER AT LEAST 30-60 MINBEFORE BREAKFAST    . lisinopril (PRINIVIL,ZESTRIL) 40 MG tablet Take 1 tablet (40 mg total) by mouth daily. 30 tablet 0  . magnesium oxide (MAG-OX) 400 MG tablet Take 400 mg by mouth daily.     . metFORMIN (GLUCOPHAGE) 500 MG tablet Take 500 mg by mouth daily.     Marland Kitchen  metoprolol succinate (TOPROL-XL) 25 MG 24 hr tablet TAKE 1 TABLET BY MOUTH DAILY (Patient taking differently: Take 25 mg by mouth 2 (two) times daily. ) 90 tablet 3  . nitroGLYCERIN (NITROSTAT) 0.4 MG SL tablet Place 1 tablet (0.4 mg total) under the tongue every 5 (five) minutes as needed for chest pain. 90 tablet 3  . pantoprazole (PROTONIX) 40 MG tablet Take 40 mg by mouth daily.    . QUEtiapine (SEROQUEL) 25 MG tablet Take 1 tablet by mouth at  bedtime.      Results for orders placed or performed during the hospital encounter of 09/05/18 (from the past 48 hour(s))  Lipase, blood     Status: None   Collection Time: 09/05/18  4:44 PM  Result Value Ref Range   Lipase 28 11 - 51 U/L    Comment: Performed at [facility], [address] [place], [place] [address]  Comprehensive metabolic panel     Status: Abnormal   Collection Time: 09/05/18  4:44 PM  Result Value Ref Range   Sodium 135 135 - 145 mmol/L   Potassium 4.4 3.5 - 5.1 mmol/L   Chloride 101 98 - 111 mmol/L   CO2 22 22 - 32 mmol/L   Glucose, Bld 233 (H) 70 - 99 mg/dL   BUN 30 (H) 8 - 23 mg/dL   Creatinine, Ser 6.041.30 (H) 0.44 - 1.00 mg/dL   Calcium 9.4 8.9 - 54.010.3 mg/dL   Total Protein 7.6 6.5 - 8.1 g/dL   Albumin 3.8 3.5 - 5.0 g/dL   AST 26 15 - 41 U/L   ALT 17 0 - 44 U/L   Alkaline Phosphatase 60 38 - 126 U/L   Total Bilirubin 0.6 0.3 - 1.2 mg/dL   GFR calc non Af Amer 34 (L) >60 mL/min   GFR calc Af Amer 40 (L) >60 mL/min   Anion gap 12 5 - 15    Comment: Performed at [facility], [address] [place], [place] [address]  CBC     Status: Abnormal   Collection Time: 09/05/18  4:44 PM  Result Value Ref Range   WBC 25.3 (H) 4.0 - 10.5 K/uL   RBC 4.63 3.87 - 5.11 MIL/uL   Hemoglobin 11.4 (L) 12.0 - 15.0 g/dL   HCT 14.737.7 82.936.0 - 56.246.0 %   MCV 81.4 80.0 - 100.0 fL   MCH 24.6 (L) 26.0 - 34.0 pg   MCHC 30.2 30.0 - 36.0 g/dL   RDW 13.016.3 (H) 86.511.5 - 78.415.5 %   Platelets 384 150 - 400 K/uL   nRBC 0.0 0.0 - 0.2 %    Comment: Performed at [facility], [address] [place], [place] [address]  CG4 I-STAT (Lactic acid)     Status: Abnormal   Collection Time: 09/05/18  8:00 PM  Result Value Ref Range   Lactic Acid, Venous 3.26 (HH) 0.5 - 1.9 mmol/L   Comment NOTIFIED PHYSICIAN    Ct Abdomen Pelvis Wo Contrast  Result Date: 09/05/2018 CLINICAL DATA:  Nausea and vomiting EXAM: CT ABDOMEN AND PELVIS WITHOUT CONTRAST TECHNIQUE:  Multidetector CT imaging of the abdomen and pelvis was performed following the standard protocol without IV contrast. COMPARISON:  10/05/2017, 09/08/2017 FINDINGS: Lower chest: Lung bases demonstrate calcified lingular nodule. No acute consolidation or effusion. Coronary vascular calcification. Hepatobiliary: Liver granuloma. No calcified gallstone or biliary dilatation Pancreas: Unremarkable. No pancreatic ductal dilatation or surrounding inflammatory changes. Spleen: Splenic granuloma Adrenals/Urinary Tract: Adrenal glands are normal. No  hydronephrosis. The bladder is unremarkable Stomach/Bowel: Stomach is nonenlarged. No dilated small bowel. Negative appendix. Extensive sigmoid colon diverticula without acute inflammatory change Vascular/Lymphatic: No significant adenopathy. Extensive aortic atherosclerosis. Focal ectasia of the infrarenal abdominal aorta up to 2.7 cm. Reproductive: Uterus and bilateral adnexa are unremarkable. Other: Negative for free air or free fluid. Stable coarse calcification in the right posterior pelvis. Musculoskeletal: Degenerative changes. No acute or suspicious abnormality IMPRESSION: 1. No CT evidence for acute intra-abdominal or pelvic abnormality 2. Evidence of prior granulomatous disease 3. Extensive sigmoid colon diverticula without acute inflammatory process 4. Ectatic infrarenal abdominal aorta up to 2.7 cm. Ectatic abdominal aorta at risk for aneurysm development. Recommend followup by ultrasound in 5 years. This recommendation follows ACR consensus guidelines: White Paper of the ACR Incidental Findings Committee II on Vascular Findings. J Am Coll Radiol 2013; 10:789-794. Electronically Signed   By: Jasmine Pang M.D.   On: 09/05/2018 20:53    Review of Systems  Constitutional: Negative for chills and fever.  HENT: Negative for sore throat and tinnitus.   Eyes: Negative for blurred vision and redness.  Respiratory: Negative for cough and shortness of breath.    Cardiovascular: Negative for chest pain, palpitations, orthopnea and PND.  Gastrointestinal: Positive for abdominal pain, diarrhea, nausea and vomiting.  Genitourinary: Negative for dysuria, frequency and urgency.  Musculoskeletal: Negative for joint pain and myalgias.  Skin: Negative for rash.       No lesions  Neurological: Negative for speech change, focal weakness and weakness.  Endo/Heme/Allergies: Does not bruise/bleed easily.       No temperature intolerance  Psychiatric/Behavioral: Negative for depression and suicidal ideas.    Blood pressure (!) 179/57, pulse (!) 109, temperature 99 F (37.2 C), temperature source Oral, resp. rate 18, SpO2 98 %. Physical Exam  Vitals reviewed. Constitutional: She is oriented to person, place, and time. She appears well-developed and well-nourished. No distress.  HENT:  Head: Normocephalic and atraumatic.  Mouth/Throat: Oropharynx is clear and moist.  Eyes: Pupils are equal, round, and reactive to light. Conjunctivae and EOM are normal. No scleral icterus.  Neck: Normal range of motion. Neck supple. No JVD present. No tracheal deviation present. No thyromegaly present.  Cardiovascular: Normal rate, regular rhythm and normal heart sounds. Exam reveals no gallop and no friction rub.  No murmur heard. Respiratory: Effort normal and breath sounds normal.  GI: Soft. Bowel sounds are normal. She exhibits no distension. There is no abdominal tenderness.  Genitourinary:    Genitourinary Comments: Deferred   Musculoskeletal: Normal range of motion.        General: No edema.  Lymphadenopathy:    She has no cervical adenopathy.  Neurological: She is alert and oriented to person, place, and time. No cranial nerve deficit. She exhibits normal muscle tone.  Skin: Skin is warm and dry. No rash noted. No erythema.  Psychiatric: She has a normal mood and affect. Her behavior is normal. Judgment and thought content normal.     Assessment/Plan This is a  84 year old female admitted for acute kidney injury. 1.  AKI: Secondary to diarrhea and dehydration.  Hydrate with intravenous fluid.  Avoid nephrotoxic agents. 2.  Diarrheal illness: GI panel pending; differential diagnosis includes food poisoning.  This should be self-limiting.  However, the patient has developed a high pulse and already has leukocytosis and lactic acidosis.  She meets criteria for sepsis.  Complete fluid resuscitation and continue to gently hydrate.  I will start metronidazole and ceftriaxone. 3.  Hypertension:  Controlled; continue hydralazine, lisinopril and metoprolol.  Labetalol as needed. 4.  CAD: Stable; continue aspirin. 5.  Hypothyroidism: Check TSH; continue Synthroid 6.  Diabetes mellitus type 2: Hold oral hypoglycemic agents.  Sliding scale insulin while hospitalized 7.  Hyperlipidemia: Continue statin therapy 8.  DVT prophylaxis: Heparin 9.  GI prophylaxis: Pantoprazole per home regimen The patient is a full code.  Time spent on admission orders and patient care approximately 45 minutes   Arnaldo Natal, MD 09/05/2018, 10:36 PM

## 2018-09-05 NOTE — ED Notes (Signed)
Critical I stat lactic of 3.26 resulted. Dr. Alphonzo Lemmings notified, additional ns at 172mL/hr x1054mL verbal order received. Pt is continuing to receive ns bolus.

## 2018-09-05 NOTE — ED Triage Notes (Signed)
Pt to ED via POV c/o N/V since this morning. Pt states that she has vomited 6-8 times. Pt states that she also has diarrhea. Pt is in NAD at this time.

## 2018-09-06 DIAGNOSIS — A419 Sepsis, unspecified organism: Secondary | ICD-10-CM | POA: Diagnosis not present

## 2018-09-06 DIAGNOSIS — K529 Noninfective gastroenteritis and colitis, unspecified: Secondary | ICD-10-CM | POA: Diagnosis not present

## 2018-09-06 LAB — GLUCOSE, CAPILLARY
Glucose-Capillary: 104 mg/dL — ABNORMAL HIGH (ref 70–99)
Glucose-Capillary: 160 mg/dL — ABNORMAL HIGH (ref 70–99)
Glucose-Capillary: 141 mg/dL — ABNORMAL HIGH (ref 70–99)
Glucose-Capillary: 124 mg/dL — ABNORMAL HIGH (ref 70–99)

## 2018-09-06 LAB — LACTIC ACID, PLASMA
Lactic Acid, Venous: 2.9 mmol/L (ref 0.5–1.9)
Lactic Acid, Venous: 2.4 mmol/L (ref 0.5–1.9)
Lactic Acid, Venous: 1.4 mmol/L (ref 0.5–1.9)

## 2018-09-06 LAB — URINALYSIS, COMPLETE (UACMP) WITH MICROSCOPIC
Bacteria, UA: NONE SEEN
Bilirubin Urine: NEGATIVE
Glucose, UA: >=500 mg/dL — AB
Hgb urine dipstick: NEGATIVE
Ketones, ur: NEGATIVE mg/dL
Leukocytes, UA: NEGATIVE
Nitrite: NEGATIVE
Protein, ur: NEGATIVE mg/dL
Specific Gravity, Urine: 1.014 (ref 1.005–1.030)
pH: 5 (ref 5.0–8.0)

## 2018-09-06 LAB — HEMOGLOBIN A1C
Hgb A1c MFr Bld: 7.1 % — ABNORMAL HIGH (ref 4.8–5.6)
Mean Plasma Glucose: 157.07 mg/dL

## 2018-09-06 NOTE — Progress Notes (Signed)
Family Meeting Note  Advance Directive:yes  Today a meeting took place with the Patient, daughter at bedside    The following clinical team members were present during this meeting:MD  The following were discussed:Patient's diagnosis: Acute kidney injury, diarrhea, dehydration, coronary artery disease, hypothyroidism, diabetes mellitus type 2, hyperlipidemia, treatment plan of care discussed in detail with the patient and her daughter at bedside.  They both verbalized understanding of the plan.     Patient's progosis: Unable to determine and Goals for treatment: DNR  Patient's daughter Gunnar Fusi is the healthcare POA  Additional follow-up to be provided: Hospitalist  Time spent during discussion:5min  Ramonita Lab, MD

## 2018-09-06 NOTE — Progress Notes (Signed)
[facility] [place] NAME: Glorie Dowlen    MR#:  [id number]  DATE OF BIRTH:  09-29-1917  SUBJECTIVE:  CHIEF COMPLAINT: Patient denies any nausea vomiting or abdominal pain.  No other episodes of diarrhea.  Daughter at bedside.  Patient is feeling weak and tired  REVIEW OF SYSTEMS:  CONSTITUTIONAL: No fever, fatigue.  Reporting generalized weakness EYES: No blurred or double vision.  EARS, NOSE, AND THROAT: No tinnitus or ear pain.  RESPIRATORY: No cough, shortness of breath, wheezing or hemoptysis.  CARDIOVASCULAR: No chest pain, orthopnea, edema.  GASTROINTESTINAL: No nausea, vomiting, diarrhea or abdominal pain.  GENITOURINARY: No dysuria, hematuria.  ENDOCRINE: No polyuria, nocturia,  HEMATOLOGY: No anemia, easy bruising or bleeding SKIN: No rash or lesion. MUSCULOSKELETAL: No joint pain or arthritis.   NEUROLOGIC: No tingling, numbness, weakness.  PSYCHIATRY: No anxiety or depression.   DRUG ALLERGIES:   Allergies  Allergen Reactions  . Cefuroxime Axetil Nausea Only    cramps  . Clonidine Derivatives Other (See Comments)    Maker her pass out  . Epinephrine   . Penicillins   . Septra [Sulfamethoxazole-Trimethoprim] Nausea And Vomiting    VITALS:  Blood pressure (!) 162/51, pulse 89, temperature (!) 97.4 F (36.3 C), temperature source Oral, resp. rate 18, weight 75.3 kg, SpO2 94 %.  PHYSICAL EXAMINATION:  GENERAL:  84 y.o.-year-old patient lying in the bed with no acute distress.  EYES: Pupils equal, round, reactive to light and accommodation. No scleral icterus. Extraocular muscles intact.  HEENT: Head atraumatic, normocephalic. Oropharynx and nasopharynx clear.  NECK:  Supple, no jugular venous distention. No thyroid enlargement, no tenderness.  LUNGS: Normal breath sounds bilaterally, no wheezing, rales,rhonchi or crepitation. No use of accessory muscles of respiration.  CARDIOVASCULAR: S1, S2 normal. No  murmurs, rubs, or gallops.  ABDOMEN: Soft, nontender, nondistended. Bowel sounds present.  EXTREMITIES: No pedal edema, cyanosis, or clubbing.  NEUROLOGIC: Awake, alert and oriented x3. Sensation intact. Gait not checked.  PSYCHIATRIC: The patient is alert and oriented x 3.  SKIN: No obvious rash, lesion, or ulcer.    LABORATORY PANEL:   CBC Recent Labs  Lab 09/05/18 1644  WBC 25.3*  HGB 11.4*  HCT 37.7  PLT 384   ------------------------------------------------------------------------------------------------------------------  Chemistries  Recent Labs  Lab 09/05/18 1644  NA 135  K 4.4  CL 101  CO2 22  GLUCOSE 233*  BUN 30*  CREATININE 1.30*  CALCIUM 9.4  AST 26  ALT 17  ALKPHOS 60  BILITOT 0.6   ------------------------------------------------------------------------------------------------------------------  Cardiac Enzymes No results for input(s): TROPONINI in the last 168 hours. ------------------------------------------------------------------------------------------------------------------  RADIOLOGY:  Ct Abdomen Pelvis Wo Contrast  Result Date: 09/05/2018 CLINICAL DATA:  Nausea and vomiting EXAM: CT ABDOMEN AND PELVIS WITHOUT CONTRAST TECHNIQUE: Multidetector CT imaging of the abdomen and pelvis was performed following the standard protocol without IV contrast. COMPARISON:  10/05/2017, 09/08/2017 FINDINGS: Lower chest: Lung bases demonstrate calcified lingular nodule. No acute consolidation or effusion. Coronary vascular calcification. Hepatobiliary: Liver granuloma. No calcified gallstone or biliary dilatation Pancreas: Unremarkable. No pancreatic ductal dilatation or surrounding inflammatory changes. Spleen: Splenic granuloma Adrenals/Urinary Tract: Adrenal glands are normal. No hydronephrosis. The bladder is unremarkable Stomach/Bowel: Stomach is nonenlarged. No dilated small bowel. Negative appendix. Extensive sigmoid colon diverticula without acute  inflammatory change Vascular/Lymphatic: No significant adenopathy. Extensive aortic atherosclerosis. Focal ectasia of the infrarenal abdominal aorta up to 2.7 cm. Reproductive: Uterus and bilateral adnexa are unremarkable. Other: Negative for free air  or free fluid. Stable coarse calcification in the right posterior pelvis. Musculoskeletal: Degenerative changes. No acute or suspicious abnormality IMPRESSION: 1. No CT evidence for acute intra-abdominal or pelvic abnormality 2. Evidence of prior granulomatous disease 3. Extensive sigmoid colon diverticula without acute inflammatory process 4. Ectatic infrarenal abdominal aorta up to 2.7 cm. Ectatic abdominal aorta at risk for aneurysm development. Recommend followup by ultrasound in 5 years. This recommendation follows ACR consensus guidelines: White Paper of the ACR Incidental Findings Committee II on Vascular Findings. J Am Coll Radiol 2013; 10:789-794. Electronically Signed   By: Donavan Foil M.D.   On: 09/05/2018 20:53    EKG:   Orders placed or performed in visit on 11/25/17  . EKG 12-Lead    ASSESSMENT AND PLAN:    This is a 84 year old female admitted for acute kidney injury.  1.  AKI: Secondary to diarrhea and dehydration.  Clinically feeling better Hydrate with intravenous fluid.  Avoid nephrotoxic agents. Baseline creatinine 0.9 currently at 1.3 Check a.m. labs  2.  Sepsis secondary to Diarrheal illness: Probably from stomach virus  Patient met criteria at the time of admission with tachycardia, leukocytosis and lactic acidosis Diarrhea improved and patient could not provide stool sample so far  GI panel pending;  fluid resuscitation and continue to gently hydrate.   On metronidazole and ceftriaxone. Lactic acid 4.9-2.4-1.4 Urinalysis negative  3.  Hypertension: Controlled; continue hydralazine, lisinopril and metoprolol.  Labetalol as needed.  4.  CAD: Stable; continue aspirin.  5.  Hypothyroidism: Normal TSH; continue  Synthroid  6.  Diabetes mellitus type 2: Hold oral hypoglycemic agents.  Sliding scale insulin while hospitalized  7.  Hyperlipidemia: Continue statin therapy  8.  DVT prophylaxis: Heparin subcutaneous  9.  GI prophylaxis: Pantoprazole per home regimen    All the records are reviewed and case discussed with Care Management/Social Workerr. Management plans discussed with the patient, family and they are in agreement.  CODE STATUS: dnr, daughter PAULA hcpoa   TOTAL TIME TAKING CARE OF THIS PATIENT: 35  minutes.   POSSIBLE D/C IN 1  DAYS, DEPENDING ON CLINICAL CONDITION.  Note: This dictation was prepared with Dragon dictation along with smaller phrase technology. Any transcriptional errors that result from this process are unintentional.   Nicholes Mango M.D on 09/06/2018 at 6:03 PM  Between 7am to 6pm - Pager - [phone] After 6pm go to [email or web address] - password [email or web address] [facility]  Office  [phone]  CC: Primary care physician; Idelle Crouch, MD

## 2018-09-06 NOTE — Progress Notes (Signed)
Chaplain responded to an OR for an AD, Pt said she already had a POA and she didn't have any other needs. Chaplain left booklet for them to review just in case.   09/06/18 1800  Clinical Encounter Type  Visited With Patient and family together  Visit Type Initial  Referral From Physician  Spiritual Encounters  Spiritual Needs Brochure

## 2018-09-06 NOTE — Progress Notes (Signed)
Lactic acid 4.9. Dr Allena Katz made aware. Order to recheck lactic acid at 0500 obtained.

## 2018-09-07 DIAGNOSIS — A419 Sepsis, unspecified organism: Secondary | ICD-10-CM | POA: Diagnosis not present

## 2018-09-07 DIAGNOSIS — K529 Noninfective gastroenteritis and colitis, unspecified: Secondary | ICD-10-CM | POA: Diagnosis not present

## 2018-09-07 LAB — CBC
HCT: 29.6 % — ABNORMAL LOW (ref 36.0–46.0)
Hemoglobin: 8.7 g/dL — ABNORMAL LOW (ref 12.0–15.0)
MCH: 24.1 pg — ABNORMAL LOW (ref 26.0–34.0)
MCHC: 29.4 g/dL — ABNORMAL LOW (ref 30.0–36.0)
MCV: 82 fL (ref 80.0–100.0)
Platelets: 270 10*3/uL (ref 150–400)
RBC: 3.61 MIL/uL — ABNORMAL LOW (ref 3.87–5.11)
RDW: 16.4 % — ABNORMAL HIGH (ref 11.5–15.5)
WBC: 12.4 10*3/uL — ABNORMAL HIGH (ref 4.0–10.5)
nRBC: 0 % (ref 0.0–0.2)

## 2018-09-07 LAB — BASIC METABOLIC PANEL
Anion gap: 5 (ref 5–15)
BUN: 20 mg/dL (ref 8–23)
CO2: 24 mmol/L (ref 22–32)
Calcium: 8.5 mg/dL — ABNORMAL LOW (ref 8.9–10.3)
Chloride: 110 mmol/L (ref 98–111)
Creatinine, Ser: 1 mg/dL (ref 0.44–1.00)
GFR calc Af Amer: 54 mL/min — ABNORMAL LOW (ref >60)
GFR calc non Af Amer: 47 mL/min — ABNORMAL LOW (ref >60)
Glucose, Bld: 135 mg/dL — ABNORMAL HIGH (ref 70–99)
Potassium: 4.4 mmol/L (ref 3.5–5.1)
Sodium: 139 mmol/L (ref 135–145)

## 2018-09-07 LAB — GLUCOSE, CAPILLARY: Glucose-Capillary: 116 mg/dL — ABNORMAL HIGH (ref 70–99)

## 2018-09-07 MED ORDER — METRONIDAZOLE 500 MG PO TABS: 500.0000 mg | ORAL_TABLET | Freq: Three times a day (TID) | ORAL | 0 refills | Status: AC

## 2018-09-07 MED ORDER — CEFDINIR 300 MG PO CAPS
300.0000 mg | ORAL_CAPSULE | Freq: Two times a day (BID) | ORAL | Status: DC
  Filled 2018-09-07 (×2): qty 1

## 2018-09-07 MED ORDER — CEFDINIR 300 MG PO CAPS: 300.0000 mg | ORAL_CAPSULE | Freq: Two times a day (BID) | ORAL | 0 refills | Status: DC

## 2018-09-07 NOTE — Progress Notes (Signed)
Pt alert and oriented. No complaints during the night. Up to bedside commode and voiding without difficulty. No more complaints of abdominal pain. No nausea, vomiting or diarrhea.

## 2018-09-07 NOTE — Discharge Instructions (Signed)
Follow-up with primary care physician in 3 to 5 days °

## 2018-09-07 NOTE — Progress Notes (Signed)
DISCHARGE NOTE:  Pt given discharge instructions and scripts. Pt verbalized understanding. Pt wheeled to car by staff.  

## 2018-09-07 NOTE — Discharge Summary (Signed)
[facility] NAME: Melinda Lewis    MR#:  [id number]  DATE OF BIRTH:  August 29, 1918  DATE OF ADMISSION:  09/05/2018 ADMITTING PHYSICIAN: Harrie Foreman, MD  DATE OF DISCHARGE: 09/07/18   PRIMARY CARE PHYSICIAN: Idelle Crouch, MD    ADMISSION DIAGNOSIS:  Gastroenteritis [K52.9]  DISCHARGE DIAGNOSIS:  Active Problems:   AKI (acute kidney injury) ([place]) Acute gastroenteritis  SECONDARY DIAGNOSIS:   Past Medical History:  Diagnosis Date  . Acute on chronic systolic CHF (congestive heart failure) ([place]) 06/16/2014  . Anemia   . CAD (coronary artery disease) 8/15   s/p DES LCX and LAD   . Cardiomyopathy, ischemic 06/16/2014  . Diabetes mellitus without complication ([place])   . Hypertension   . NSTEMI (non-ST elevated myocardial infarction) ([place])   . Thyroid disease   . TIA (transient ischemic attack) 2010    HOSPITAL COURSE:   HPI: This is a 84 year old female with a past medical history of ischemic cardiomyopathy following an STEMI, hypothyroidism, diabetes, hypertension and history of TIAs who presents to the emergency department complaining of diarrhea.  The patient started having loose stools today.  They have been nonbloody.  She is also had a few episodes of nonbloody nonbilious emesis.  This all started after a pot luck [place] dinner last night.  Laboratory evaluation revealed leukocytosis.  CT of the patient's abdomen showed no areas of diverticulitis or ischemic bowel.  The patient's abdomen does not hurt at this time.  She is afebrile and stable but dehydrated with some acute kidney injury.  Due to the aforementioned abnormalities the emergency department staff called the hospitalist service for admission.  Hospital course  1. AKI: Secondary to diarrhea and dehydration.  Clinically feeling betterHydrate with intravenous fluid. Avoid nephrotoxic agents. Baseline creatinine 0.9.  During the hospital course  creatinine at 1.3 after IV fluids it trended down to 1.0 AKI resolved  2.  Sepsis secondary toDiarrheal illness: Probably from stomach virus  Patient met criteria at the time of admission with tachycardia, leukocytosis and lactic acidosis Diarrhea improved and patient could not provide stool sample so far  GI panel pending; WBC trended from 25,000-12,400 Patient was given Flagyl and Rocephin IV will discharge with Flagyl and Omnicef for 5 more days for benefit of doubt  fluid resuscitation provided Lactic acid 4.9-2.4-1.4 Urinalysis negative  3. Hypertension: Controlled; continue hydralazine, lisinopril and metoprolol. Labetalol as needed.  4. CAD: Stable; continue aspirin.  5. Hypothyroidism: Normal TSH; continue Synthroid  6. Diabetes mellitus type 2: Hold oral hypoglycemic agents. Sliding scale insulin while hospitalized  7. Hyperlipidemia: Continue statin therapy  8. DVT prophylaxis: Heparin subcutaneous  DISCHARGE CONDITIONS:  Stable   CONSULTS OBTAINED:     PROCEDURES  None   DRUG ALLERGIES:   Allergies  Allergen Reactions  . Cefuroxime Axetil Nausea Only    cramps  . Clonidine Derivatives Other (See Comments)    Maker her pass out  . Epinephrine   . Penicillins   . Septra [Sulfamethoxazole-Trimethoprim] Nausea And Vomiting    DISCHARGE MEDICATIONS:   Allergies as of 09/07/2018      Reactions   Cefuroxime Axetil Nausea Only   cramps   Clonidine Derivatives Other (See Comments)   Maker her pass out   Epinephrine    Penicillins    Septra [sulfamethoxazole-trimethoprim] Nausea And Vomiting      Medication List    TAKE these medications   aspirin EC 81 MG tablet  Take 81 mg by mouth daily.   atorvastatin 40 MG tablet Commonly known as:  LIPITOR TAKE 1 TABLET BY MOUTH DAILY AT 6PM   cefdinir 300 MG capsule Commonly known as:  OMNICEF Take 1 capsule (300 mg total) by mouth every 12 (twelve) hours.   FLAX SEED OIL PO Take 1  tablet by mouth daily.   hydrALAZINE 50 MG tablet Commonly known as:  APRESOLINE Take 1 tablet (50 mg total) by mouth daily as needed (For Systolic ZO>109).   L-Arginine 1000 MG Tabs Take 1 tablet by mouth 3 (three) times daily.   levothyroxine 100 MCG tablet Commonly known as:  SYNTHROID, LEVOTHROID TAKE 1 TABLET EVERY DAY ON EMPTY STOMACHWITH A GLASS OF WATER AT LEAST 30-60 MINBEFORE BREAKFAST   lisinopril 40 MG tablet Commonly known as:  PRINIVIL,ZESTRIL Take 1 tablet (40 mg total) by mouth daily.   magnesium oxide 400 MG tablet Commonly known as:  MAG-OX Take 400 mg by mouth daily.   metFORMIN 500 MG tablet Commonly known as:  GLUCOPHAGE Take 500 mg by mouth daily.   metoprolol succinate 25 MG 24 hr tablet Commonly known as:  TOPROL-XL TAKE 1 TABLET BY MOUTH DAILY What changed:  when to take this   metroNIDAZOLE 500 MG tablet Commonly known as:  FLAGYL Take 1 tablet (500 mg total) by mouth 3 (three) times daily for 5 days.   nitroGLYCERIN 0.4 MG SL tablet Commonly known as:  NITROSTAT Place 1 tablet (0.4 mg total) under the tongue every 5 (five) minutes as needed for chest pain.   pantoprazole 40 MG tablet Commonly known as:  PROTONIX Take 40 mg by mouth daily.   QUEtiapine 25 MG tablet Commonly known as:  SEROQUEL Take 1 tablet by mouth at bedtime.        DISCHARGE INSTRUCTIONS:   Follow-up with primary care physician in 3 to 5 days  DIET:  Cardiac diet and Diabetic diet  DISCHARGE CONDITION:  Fair  ACTIVITY:  Activity as tolerated  OXYGEN:  Home Oxygen: No.   Oxygen Delivery: room air  DISCHARGE LOCATION:  home   If you experience worsening of your admission symptoms, develop shortness of breath, life threatening emergency, suicidal or homicidal thoughts you must seek medical attention immediately by calling 911 or calling your MD immediately  if symptoms less severe.  You Must read complete instructions/literature along with all the  possible adverse reactions/side effects for all the Medicines you take and that have been prescribed to you. Take any new Medicines after you have completely understood and accpet all the possible adverse reactions/side effects.   Please note  You were cared for by a hospitalist during your hospital stay. If you have any questions about your discharge medications or the care you received while you were in the hospital after you are discharged, you can call the unit and asked to speak with the hospitalist on call if the hospitalist that took care of you is not available. Once you are discharged, your primary care physician will handle any further medical issues. Please note that NO REFILLS for any discharge medications will be authorized once you are discharged, as it is imperative that you return to your primary care physician (or establish a relationship with a primary care physician if you do not have one) for your aftercare needs so that they can reassess your need for medications and monitor your lab values.     Today  Chief Complaint  Patient presents with  . Nausea  .  Emesis   Patient is feeling much better.  Denies any nausea vomiting or abdominal pain or diarrhea.  Patient lives by herself and takes care of herself ambulating fine.  Daughters visit her very often  ROS:  CONSTITUTIONAL: Denies fevers, chills. Denies any fatigue, weakness.  EYES: Denies blurry vision, double vision, eye pain. EARS, NOSE, THROAT: Denies tinnitus, ear pain, hearing loss. RESPIRATORY: Denies cough, wheeze, shortness of breath.  CARDIOVASCULAR: Denies chest pain, palpitations, edema.  GASTROINTESTINAL: Denies nausea, vomiting, diarrhea, abdominal pain. Denies bright red blood per rectum. GENITOURINARY: Denies dysuria, hematuria. ENDOCRINE: Denies nocturia or thyroid problems. HEMATOLOGIC AND LYMPHATIC: Denies easy bruising or bleeding. SKIN: Denies rash or lesion. MUSCULOSKELETAL: Denies pain in neck,  back, shoulder, knees, hips or arthritic symptoms.  NEUROLOGIC: Denies paralysis, paresthesias.  PSYCHIATRIC: Denies anxiety or depressive symptoms.   VITAL SIGNS:  Blood pressure (!) 161/58, pulse 79, temperature 98.6 F (37 C), temperature source Oral, resp. rate 18, weight 75.3 kg, SpO2 94 %.  I/O:    Intake/Output Summary (Last 24 hours) at 09/07/2018 0947 Last data filed at 09/06/2018 1126 Gross per 24 hour  Intake -  Output 200 ml  Net -200 ml    PHYSICAL EXAMINATION:  GENERAL:  84 y.o.-year-old patient lying in the bed with no acute distress.  EYES: Pupils equal, round, reactive to light and accommodation. No scleral icterus. Extraocular muscles intact.  HEENT: Head atraumatic, normocephalic. Oropharynx and nasopharynx clear.  NECK:  Supple, no jugular venous distention. No thyroid enlargement, no tenderness.  LUNGS: Normal breath sounds bilaterally, no wheezing, rales,rhonchi or crepitation. No use of accessory muscles of respiration.  CARDIOVASCULAR: S1, S2 normal. No murmurs, rubs, or gallops.  ABDOMEN: Soft, non-tender, non-distended. Bowel sounds present.  EXTREMITIES: No pedal edema, cyanosis, or clubbing.  NEUROLOGIC: Awake, alert and oriented x3 sensation intact. Gait not checked.  PSYCHIATRIC: The patient is alert and oriented x 3.  SKIN: No obvious rash, lesion, or ulcer.   DATA REVIEW:   CBC Recent Labs  Lab 09/07/18 0910  WBC 12.4*  HGB 8.7*  HCT 29.6*  PLT 270    Chemistries  Recent Labs  Lab 09/05/18 1644 09/07/18 0910  NA 135 139  K 4.4 4.4  CL 101 110  CO2 22 24  GLUCOSE 233* 135*  BUN 30* 20  CREATININE 1.30* 1.00  CALCIUM 9.4 8.5*  AST 26  --   ALT 17  --   ALKPHOS 60  --   BILITOT 0.6  --     Cardiac Enzymes No results for input(s): TROPONINI in the last 168 hours.  Microbiology Results  Results for orders placed or performed during the hospital encounter of 10/05/17  Culture, blood (routine x 2)     Status: None    Collection Time: 10/05/17 11:18 AM  Result Value Ref Range Status   Specimen Description   Final    BLOOD Blood Culture results may not be optimal due to an excessive volume of blood received in culture bottles   Special Requests   Final    BOTTLES DRAWN AEROBIC AND ANAEROBIC RIGHT ANTECUBITAL   Culture   Final    NO GROWTH 5 DAYS Performed at [facility], [address], [place], [place] [address]    Report Status 10/10/2017 FINAL  Final  Culture, blood (routine x 2)     Status: None   Collection Time: 10/05/17 11:44 AM  Result Value Ref Range Status   Specimen Description BLOOD LT FOREARM  Final  Special Requests   Final    BOTTLES DRAWN AEROBIC AND ANAEROBIC Blood Culture results may not be optimal due to an excessive volume of blood received in culture bottles   Culture   Final    NO GROWTH 5 DAYS Performed at [facility], [address] [place], [place] [address]    Report Status 10/10/2017 FINAL  Final    RADIOLOGY:  Ct Abdomen Pelvis Wo Contrast  Result Date: 09/05/2018 CLINICAL DATA:  Nausea and vomiting EXAM: CT ABDOMEN AND PELVIS WITHOUT CONTRAST TECHNIQUE: Multidetector CT imaging of the abdomen and pelvis was performed following the standard protocol without IV contrast. COMPARISON:  10/05/2017, 09/08/2017 FINDINGS: Lower chest: Lung bases demonstrate calcified lingular nodule. No acute consolidation or effusion. Coronary vascular calcification. Hepatobiliary: Liver granuloma. No calcified gallstone or biliary dilatation Pancreas: Unremarkable. No pancreatic ductal dilatation or surrounding inflammatory changes. Spleen: Splenic granuloma Adrenals/Urinary Tract: Adrenal glands are normal. No hydronephrosis. The bladder is unremarkable Stomach/Bowel: Stomach is nonenlarged. No dilated small bowel. Negative appendix. Extensive sigmoid colon diverticula without acute inflammatory change Vascular/Lymphatic: No significant adenopathy. Extensive aortic  atherosclerosis. Focal ectasia of the infrarenal abdominal aorta up to 2.7 cm. Reproductive: Uterus and bilateral adnexa are unremarkable. Other: Negative for free air or free fluid. Stable coarse calcification in the right posterior pelvis. Musculoskeletal: Degenerative changes. No acute or suspicious abnormality IMPRESSION: 1. No CT evidence for acute intra-abdominal or pelvic abnormality 2. Evidence of prior granulomatous disease 3. Extensive sigmoid colon diverticula without acute inflammatory process 4. Ectatic infrarenal abdominal aorta up to 2.7 cm. Ectatic abdominal aorta at risk for aneurysm development. Recommend followup by ultrasound in 5 years. This recommendation follows ACR consensus guidelines: White Paper of the ACR Incidental Findings Committee II on Vascular Findings. J Am Coll Radiol 2013; 10:789-794. Electronically Signed   By: Donavan Foil M.D.   On: 09/05/2018 20:53    EKG:   Orders placed or performed in visit on 11/25/17  . EKG 12-Lead      Management plans discussed with the patient, family and they are in agreement.  CODE STATUS:     Code Status Orders  (From admission, onward)         Start     Ordered   09/06/18 1803  Do not attempt resuscitation (DNR)  Continuous    Question Answer Comment  In the event of cardiac or respiratory ARREST Do not call a "code blue"   In the event of cardiac or respiratory ARREST Do not perform Intubation, CPR, defibrillation or ACLS   In the event of cardiac or respiratory ARREST Use medication by any route, position, wound care, and other measures to relive pain and suffering. May use oxygen, suction and manual treatment of airway obstruction as needed for comfort.   Comments RN may pronounce      09/06/18 1802        Code Status History    Date Active Date Inactive Code Status Order ID Comments User Context   09/05/2018 2226 09/06/2018 1802 Full Code [id number]  Harrie Foreman, MD Inpatient   09/09/2017 2349  09/12/2017 2028 Full Code [id number]  Max Sane, MD Inpatient   09/08/2017 1838 09/09/2017 1718 Full Code [id number]  Nicholes Mango, MD Inpatient   05/04/2014 1749 05/05/2014 1524 Full Code [id number]  Martinique, Peter M, MD Inpatient   05/03/2014 1751 05/04/2014 1749 Full Code [id number]  [place], Peter M, MD Inpatient   05/02/2014 0733 05/03/2014 1751 Full Code [id number]  Javier Docker, MD Inpatient  Advance Directive Documentation     Most Recent Value  Type of Advance Directive  Healthcare Power of Attorney, Living will  Pre-existing out of facility DNR order (yellow form or pink MOST form)  -  "MOST" Form in Place?  -      TOTAL TIME TAKING CARE OF THIS PATIENT: 43 minutes.   Note: This dictation was prepared with Dragon dictation along with smaller phrase technology. Any transcriptional errors that result from this process are unintentional.   _0 @  on 09/07/2018 at 9:47 AM  Between 7am to 6pm - Pager - [phone]  After 6pm go to [email or web address] - password [email or web address] [facility]  Office  [phone]  CC: Primary care physician; Idelle Crouch, MD

## 2018-09-15 ENCOUNTER — Telehealth: Payer: Self-pay | Admitting: Licensed Clinical Social Worker

## 2018-09-15 NOTE — Telephone Encounter (Signed)
EMMI flagged patient for answering yes to feeling sad/hopeless/anxious/empty. Clinical Child psychotherapist (CSW) attempted to contact patient via telephone however she did not answer and a voicemail could not be left.   [employer], LCSW [phone]

## 2018-09-16 NOTE — Telephone Encounter (Signed)
Clinical Child psychotherapist (CSW) attempted to contact patient for a second time and she did not answer and a voicemail could not be left. CSW attempted to contact patient's daughter Melinda Lewis however she did not answer and a voicemail was left. CSW was able to reach patient's daughter Melinda Lewis via telephone. Per Melinda Lewis she and her sister are staying with patient at night time and patient is doing okay. Per Melinda Lewis patient does not answer the phone for numbers she does not recognize and Melinda Lewis agreed to call patient and tell her to answer when CSW calls.   CSW contacted patient for a third time after speaking with patient's daughter Melinda Lewis and patient answered. Patient scored 0 on PHQ-9. Patient reported that she is doing fine and is not having any symptoms of depression. Patient reported that she is not having thoughts of hurting herself. Patient reported no needs or concerns.   [employer], LCSW [phone]

## 2018-09-19 ENCOUNTER — Emergency Department
Admission: EM | Admit: 2018-09-19 | Discharge: 2018-09-19 | Disposition: A | Discharge status: Home / Self Care | Payer: PPO | Attending: Emergency Medicine | Admitting: Emergency Medicine

## 2018-09-19 ENCOUNTER — Encounter: Payer: Self-pay | Admitting: Emergency Medicine

## 2018-09-19 ENCOUNTER — Other Ambulatory Visit: Payer: Self-pay

## 2018-09-19 DIAGNOSIS — E039 Hypothyroidism, unspecified: Secondary | ICD-10-CM | POA: Insufficient documentation

## 2018-09-19 DIAGNOSIS — Z87891 Personal history of nicotine dependence: Secondary | ICD-10-CM | POA: Diagnosis not present

## 2018-09-19 DIAGNOSIS — E119 Type 2 diabetes mellitus without complications: Secondary | ICD-10-CM | POA: Insufficient documentation

## 2018-09-19 DIAGNOSIS — I252 Old myocardial infarction: Secondary | ICD-10-CM | POA: Diagnosis not present

## 2018-09-19 DIAGNOSIS — R110 Nausea: Secondary | ICD-10-CM | POA: Diagnosis not present

## 2018-09-19 DIAGNOSIS — I110 Hypertensive heart disease with heart failure: Secondary | ICD-10-CM | POA: Diagnosis not present

## 2018-09-19 DIAGNOSIS — I10 Essential (primary) hypertension: Secondary | ICD-10-CM | POA: Diagnosis not present

## 2018-09-19 DIAGNOSIS — R0902 Hypoxemia: Secondary | ICD-10-CM | POA: Diagnosis not present

## 2018-09-19 DIAGNOSIS — Z8673 Personal history of transient ischemic attack (TIA), and cerebral infarction without residual deficits: Secondary | ICD-10-CM | POA: Insufficient documentation

## 2018-09-19 DIAGNOSIS — I5022 Chronic systolic (congestive) heart failure: Secondary | ICD-10-CM | POA: Diagnosis not present

## 2018-09-19 DIAGNOSIS — R5381 Other malaise: Secondary | ICD-10-CM | POA: Diagnosis not present

## 2018-09-19 LAB — CBC WITH DIFFERENTIAL/PLATELET
Abs Immature Granulocytes: 0.04 10*3/uL (ref 0.00–0.07)
BASOS PCT: 1 %
Basophils Absolute: 0.1 10*3/uL (ref 0.0–0.1)
Eosinophils Absolute: 0.2 10*3/uL (ref 0.0–0.5)
Eosinophils Relative: 2 %
HCT: 34.5 % — ABNORMAL LOW (ref 36.0–46.0)
Hemoglobin: 10.4 g/dL — ABNORMAL LOW (ref 12.0–15.0)
Immature Granulocytes: 0 %
Lymphocytes Relative: 37 %
Lymphs Abs: 4.8 10*3/uL — ABNORMAL HIGH (ref 0.7–4.0)
MCH: 24.1 pg — ABNORMAL LOW (ref 26.0–34.0)
MCHC: 30.1 g/dL (ref 30.0–36.0)
MCV: 79.9 fL — ABNORMAL LOW (ref 80.0–100.0)
Monocytes Absolute: 0.9 10*3/uL (ref 0.1–1.0)
Monocytes Relative: 7 %
Neutro Abs: 7 10*3/uL (ref 1.7–7.7)
Neutrophils Relative %: 53 %
PLATELETS: 436 10*3/uL — AB (ref 150–400)
RBC: 4.32 MIL/uL (ref 3.87–5.11)
RDW: 16.1 % — AB (ref 11.5–15.5)
WBC: 13 10*3/uL — ABNORMAL HIGH (ref 4.0–10.5)
nRBC: 0 % (ref 0.0–0.2)

## 2018-09-19 LAB — COMPREHENSIVE METABOLIC PANEL
ALT: 16 U/L (ref 0–44)
AST: 22 U/L (ref 15–41)
Albumin: 3.5 g/dL (ref 3.5–5.0)
Alkaline Phosphatase: 53 U/L (ref 38–126)
Anion gap: 8 (ref 5–15)
BUN: 27 mg/dL — ABNORMAL HIGH (ref 8–23)
CO2: 25 mmol/L (ref 22–32)
Calcium: 9.3 mg/dL (ref 8.9–10.3)
Chloride: 104 mmol/L (ref 98–111)
Creatinine, Ser: 1.04 mg/dL — ABNORMAL HIGH (ref 0.44–1.00)
GFR calc non Af Amer: 45 mL/min — ABNORMAL LOW (ref >60)
GFR, EST AFRICAN AMERICAN: 52 mL/min — AB (ref >60)
Glucose, Bld: 122 mg/dL — ABNORMAL HIGH (ref 70–99)
Potassium: 4.3 mmol/L (ref 3.5–5.1)
Sodium: 137 mmol/L (ref 135–145)
TOTAL PROTEIN: 7.2 g/dL (ref 6.5–8.1)
Total Bilirubin: 0.5 mg/dL (ref 0.3–1.2)

## 2018-09-19 LAB — LIPASE, BLOOD: Lipase: 34 U/L (ref 11–51)

## 2018-09-19 LAB — TROPONIN I: Troponin I: <0.03 ng/mL (ref <0.03)

## 2018-09-19 MED ORDER — ONDANSETRON 4 MG PO TBDP: 4.0000 mg | ORAL_TABLET | Freq: Three times a day (TID) | ORAL | 0 refills | Status: DC | PRN

## 2018-09-19 NOTE — ED Provider Notes (Signed)
[facility] Emergency Department Provider Note       Time seen: ----------------------------------------- 1:22 PM on 09/19/2018 -----------------------------------------   I have reviewed the triage vital signs and the nursing notes.  HISTORY   Chief Complaint Hypertension and Nausea   HPI Melinda Lewis is a 85 y.o. female with a history of CHF, anemia, coronary disease, DM, hypertension, NSTEMI who presents to the ED for nausea which has resolved without any treatment.  Patient was found to be hypertensive by EMS and was sent to the ER for further evaluation.  She denies any other symptoms.  Past Medical History:  Diagnosis Date  . Acute on chronic systolic CHF (congestive heart failure) ([facility]) 06/16/2014  . Anemia   . CAD (coronary artery disease) 8/15   s/p DES LCX and LAD   . Cardiomyopathy, ischemic 06/16/2014  . Diabetes mellitus without complication ([facility])   . Hypertension   . NSTEMI (non-ST elevated myocardial infarction) ([facility])   . Thyroid disease   . TIA (transient ischemic attack) 2010    Patient Active Problem List   Diagnosis Date Noted  . AKI (acute kidney injury) ([facility]) 09/05/2018  . Diabetes mellitus without complication ([facility]) 10/22/2017  . 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 Aortic atherosclerosis ([facility]) 10/22/2017  . Carotid disease, bilateral ([facility]) 10/22/2017  . Uncontrolled hypertension 09/09/2017  . Nausea & vomiting 09/08/2017  . Chronic systolic CHF (congestive heart failure) ([facility]) 03/04/2015  . Acute on chronic systolic CHF (congestive heart failure) ([facility]) 06/16/2014  . Cardiomyopathy, ischemic 06/16/2014  . CAD, multiple vessel 06/15/2014  . Anemia 05/05/2014  . Hypothyroidism 05/05/2014  . 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/05/2014  . NSTEMI (non-ST elevated myocardial infarction) ([facility]) 05/02/2014  . Hypertension 05/02/2014  . Pulmonary edema cardiac cause ([facility]) 05/02/2014    Past Surgical History:  Procedure  Laterality Date  . LEFT HEART CATHETERIZATION WITH CORONARY ANGIOGRAM N/A 05/03/2014   Procedure: LEFT HEART CATHETERIZATION WITH CORONARY ANGIOGRAM;  Surgeon: Peter M Swaziland, MD;  Location: [facility] CATH LAB;  Service: Cardiovascular;  Laterality: N/A;  . PERCUTANEOUS CORONARY STENT INTERVENTION (PCI-S) Right 05/03/2014   Procedure: PERCUTANEOUS CORONARY STENT INTERVENTION (PCI-S);  Surgeon: Peter M Swaziland, MD;  Location[facility] CATH LAB;  Service: Cardiovascular;  Laterality: Right;  . PERCUTANEOUS CORONARY STENT INTERVENTION (PCI-S) N/A 05/04/2014   Procedure: PERCUTANEOUS CORONARY STENT INTERVENTION (PCI-S);  Surgeon: Peter M Swaziland, MD;  Location: [facility] CATH LAB;  Service: Cardiovascular;  Laterality: N/A;    Allergies Cefuroxime axetil; Clonidine derivatives; Epinephrine; Penicillins; and Septra [sulfamethoxazole-trimethoprim]  Social History Social History   Tobacco Use  . Smoking status: Former Smoker    Types: Cigarettes  . Smokeless tobacco: Never Used  Substance Use Topics  . Alcohol use: No  . Drug use: No   Review of Systems Constitutional: Negative for fever. Cardiovascular: Negative for chest pain. Respiratory: Negative for shortness of breath. Gastrointestinal: Negative for abdominal pain, positive for recent nausea Musculoskeletal: Negative for back pain. Skin: Negative for rash. Neurological: Negative for headaches, focal weakness or numbness.  All systems negative/normal/unremarkable except as stated in the HPI  ____________________________________________   PHYSICAL EXAM:  VITAL SIGNS: ED Triage Vitals [09/19/18 1317]  Enc Vitals Group     BP (!) 233/81     Pulse Rate 97     Resp 18     Temp 99.2 F (37.3 C)     Temp Source Oral     SpO2 96 %     Weight 165 lb (74.8  kg)     Height 5\' 2"  (1.575 m)     Head Circumference      Peak Flow      Pain Score 0     Pain Loc      Pain Edu?      Excl. in GC?    Constitutional: Alert and oriented. Well appearing and  in no distress. Eyes: Conjunctivae are normal. Normal extraocular movements. ENT      Head: Normocephalic and atraumatic.      Nose: No congestion/rhinnorhea.      Mouth/Throat: Mucous membranes are moist.      Neck: No stridor. Cardiovascular: Normal rate, regular rhythm. No murmurs, rubs, or gallops. Respiratory: Normal respiratory effort without tachypnea nor retractions. Breath sounds are clear and equal bilaterally. No wheezes/rales/rhonchi. Gastrointestinal: Soft and nontender. Normal bowel sounds Musculoskeletal: Nontender with normal range of motion in extremities. No lower extremity tenderness nor edema. Neurologic:  Normal speech and language. No gross focal neurologic deficits are appreciated.  Skin:  Skin is warm, dry and intact. No rash noted. Psychiatric: Mood and affect are normal. Speech and behavior are normal.  ____________________________________________  ED COURSE:  As part of my medical decision making, I reviewed the following data within the electronic MEDICAL RECORD NUMBER History obtained from family if available, nursing notes, old chart and ekg, as well as notes from prior ED visits. Patient presented for nausea and hypertension, we will assess with labs as indicated at this time.   Procedures ____________________________________________   LABS (pertinent positives/negatives)  Labs Reviewed  CBC WITH DIFFERENTIAL/PLATELET - Abnormal; Notable for the following components:      Result Value   WBC 13.0 (*)    Hemoglobin 10.4 (*)    HCT 34.5 (*)    MCV 79.9 (*)    MCH 24.1 (*)    RDW 16.1 (*)    Platelets 436 (*)    Lymphs Abs 4.8 (*)    All other components within normal limits  COMPREHENSIVE METABOLIC PANEL - Abnormal; Notable for the following components:   Glucose, Bld 122 (*)    BUN 27 (*)    Creatinine, Ser 1.04 (*)    GFR calc non Af Amer 45 (*)    GFR calc Af Amer 52 (*)    All other components within normal limits  LIPASE, BLOOD  TROPONIN I   URINALYSIS, COMPLETE (UACMP) WITH MICROSCOPIC   ____________________________________________   DIFFERENTIAL DIAGNOSIS   Nausea, gastroenteritis, dehydration, electrolyte abnormality, asymptomatic hypertension  FINAL ASSESSMENT AND PLAN  Nausea, asymptomatic hypertension   Plan: The patient had presented for nausea and hypertension. Patient's labs did not reveal any acute process and were at her baseline.  She has asymptomatic hypertension and I do not see any reason to intervene at this point.  She has no complaints of any kind.  I will prescribe Zofran and advised for outpatient follow-up.   Ulice Dash, MD    Note: This note was generated in part or whole with voice recognition software. Voice recognition is usually quite accurate but there are transcription errors that can and very often do occur. I apologize for any typographical errors that were not detected and corrected.     Emily Filbert, MD 09/19/18 [phone]

## 2018-09-19 NOTE — ED Triage Notes (Signed)
Pt to ED via EMS from home originally called out for nausea but has resolved without intervention.  Pt found to be hypertensive with EMS and hx of same taking medications, denies vomiting or diarrhea, denies pain or SOB.  EMS vitals 194 CBG, afebrile, sinus rhythm, BP 230/98 and then 235/94.  Pt presents A&Ox4, chest rise even and unlabored, in NAD at this time.

## 2018-10-15 DIAGNOSIS — E119 Type 2 diabetes mellitus without complications: Secondary | ICD-10-CM | POA: Diagnosis not present

## 2018-11-04 ENCOUNTER — Emergency Department
Admission: EM | Admit: 2018-11-04 | Discharge: 2018-11-04 | Disposition: A | Discharge status: Home / Self Care | Payer: PPO | Attending: Emergency Medicine | Admitting: Emergency Medicine

## 2018-11-04 ENCOUNTER — Other Ambulatory Visit: Payer: Self-pay

## 2018-11-04 DIAGNOSIS — R251 Tremor, unspecified: Secondary | ICD-10-CM | POA: Diagnosis not present

## 2018-11-04 DIAGNOSIS — I5022 Chronic systolic (congestive) heart failure: Secondary | ICD-10-CM | POA: Diagnosis not present

## 2018-11-04 DIAGNOSIS — E039 Hypothyroidism, unspecified: Secondary | ICD-10-CM | POA: Insufficient documentation

## 2018-11-04 DIAGNOSIS — I110 Hypertensive heart disease with heart failure: Secondary | ICD-10-CM | POA: Insufficient documentation

## 2018-11-04 DIAGNOSIS — I252 Old myocardial infarction: Secondary | ICD-10-CM | POA: Diagnosis not present

## 2018-11-04 DIAGNOSIS — I10 Essential (primary) hypertension: Secondary | ICD-10-CM | POA: Diagnosis not present

## 2018-11-04 DIAGNOSIS — E119 Type 2 diabetes mellitus without complications: Secondary | ICD-10-CM | POA: Diagnosis not present

## 2018-11-04 DIAGNOSIS — Z8673 Personal history of transient ischemic attack (TIA), and cerebral infarction without residual deficits: Secondary | ICD-10-CM | POA: Insufficient documentation

## 2018-11-04 DIAGNOSIS — I2510 Atherosclerotic heart disease of native coronary artery without angina pectoris: Secondary | ICD-10-CM | POA: Diagnosis not present

## 2018-11-04 DIAGNOSIS — Z87891 Personal history of nicotine dependence: Secondary | ICD-10-CM | POA: Insufficient documentation

## 2018-11-04 LAB — CBC WITH DIFFERENTIAL/PLATELET
Abs Immature Granulocytes: 0.03 10*3/uL (ref 0.00–0.07)
BASOS ABS: 0.1 10*3/uL (ref 0.0–0.1)
Basophils Relative: 1 %
Eosinophils Absolute: 0.1 10*3/uL (ref 0.0–0.5)
Eosinophils Relative: 1 %
HCT: 33.1 % — ABNORMAL LOW (ref 36.0–46.0)
Hemoglobin: 9.9 g/dL — ABNORMAL LOW (ref 12.0–15.0)
Immature Granulocytes: 0 %
LYMPHS ABS: 3.3 10*3/uL (ref 0.7–4.0)
Lymphocytes Relative: 34 %
MCH: 24.1 pg — ABNORMAL LOW (ref 26.0–34.0)
MCHC: 29.9 g/dL — ABNORMAL LOW (ref 30.0–36.0)
MCV: 80.5 fL (ref 80.0–100.0)
Monocytes Absolute: 0.7 10*3/uL (ref 0.1–1.0)
Monocytes Relative: 7 %
NRBC: 0 % (ref 0.0–0.2)
Neutro Abs: 5.6 10*3/uL (ref 1.7–7.7)
Neutrophils Relative %: 57 %
Platelets: 326 10*3/uL (ref 150–400)
RBC: 4.11 MIL/uL (ref 3.87–5.11)
RDW: 15.7 % — ABNORMAL HIGH (ref 11.5–15.5)
WBC: 9.8 10*3/uL (ref 4.0–10.5)

## 2018-11-04 LAB — URINALYSIS, COMPLETE (UACMP) WITH MICROSCOPIC
Bacteria, UA: NONE SEEN
Bilirubin Urine: NEGATIVE
Glucose, UA: 50 mg/dL — AB
HGB URINE DIPSTICK: NEGATIVE
Ketones, ur: NEGATIVE mg/dL
Leukocytes,Ua: NEGATIVE
Nitrite: NEGATIVE
Protein, ur: NEGATIVE mg/dL
SPECIFIC GRAVITY, URINE: 1.01 (ref 1.005–1.030)
pH: 6 (ref 5.0–8.0)

## 2018-11-04 LAB — COMPREHENSIVE METABOLIC PANEL
ALT: 17 U/L (ref 0–44)
ANION GAP: 5 (ref 5–15)
AST: 20 U/L (ref 15–41)
Albumin: 3.4 g/dL — ABNORMAL LOW (ref 3.5–5.0)
Alkaline Phosphatase: 51 U/L (ref 38–126)
BUN: 25 mg/dL — ABNORMAL HIGH (ref 8–23)
CO2: 29 mmol/L (ref 22–32)
Calcium: 9.3 mg/dL (ref 8.9–10.3)
Chloride: 106 mmol/L (ref 98–111)
Creatinine, Ser: 0.96 mg/dL (ref 0.44–1.00)
GFR calc Af Amer: 57 mL/min — ABNORMAL LOW (ref >60)
GFR calc non Af Amer: 49 mL/min — ABNORMAL LOW (ref >60)
Glucose, Bld: 125 mg/dL — ABNORMAL HIGH (ref 70–99)
Potassium: 3.8 mmol/L (ref 3.5–5.1)
Sodium: 140 mmol/L (ref 135–145)
Total Bilirubin: 0.4 mg/dL (ref 0.3–1.2)
Total Protein: 7.2 g/dL (ref 6.5–8.1)

## 2018-11-04 MED ORDER — SODIUM CHLORIDE 0.9% FLUSH: 3.0000 mL | Freq: Once | INTRAVENOUS | Status: DC

## 2018-11-04 MED ORDER — LORAZEPAM 0.5 MG PO TABS: 0.5000 mg | ORAL_TABLET | Freq: Three times a day (TID) | ORAL | 0 refills | Status: DC | PRN

## 2018-11-04 NOTE — ED Provider Notes (Signed)
[facility] Emergency Department Provider Note       Time seen: ----------------------------------------- 4:06 PM on 11/04/2018 -----------------------------------------   I have reviewed the triage vital signs and the nursing notes.  HISTORY   Chief Complaint Hand Swelling and Tremors   HPI Melinda Lewis is a 85 y.o. female with a history of CHF, anemia, coronary disease, ischemic cardiomyopathy, diabetes, and STEMI, TIA who presents to the ED for lateral hand swelling with tremors for the past 2 days.  She denies any recent illness.  She denies any change in her medications, has been eating and drinking normally she states.  Past Medical History:  Diagnosis Date  . Acute on chronic systolic CHF (congestive heart failure) (HCC) 06/16/2014  . Anemia   . CAD (coronary artery disease) 8/15   s/p DES LCX and LAD   . Cardiomyopathy, ischemic 06/16/2014  . Diabetes mellitus without complication (HCC)   . Hypertension   . NSTEMI (non-ST elevated myocardial infarction) (HCC)   . Thyroid disease   . TIA (transient ischemic attack) 2010    Patient Active Problem List   Diagnosis Date Noted  . AKI (acute kidney injury) (HCC) 09/05/2018  . Diabetes mellitus without complication (HCC) 10/22/2017  . 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 Aortic atherosclerosis (HCC) 10/22/2017  . Carotid disease, bilateral (HCC) 10/22/2017  . Uncontrolled hypertension 09/09/2017  . Nausea & vomiting 09/08/2017  . Chronic systolic CHF (congestive heart failure) (HCC) 03/04/2015  . Acute on chronic systolic CHF (congestive heart failure) (HCC) 06/16/2014  . Cardiomyopathy, ischemic 06/16/2014  . CAD, multiple vessel 06/15/2014  . Anemia 05/05/2014  . Hypothyroidism 05/05/2014  . 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/05/2014  . NSTEMI (non-ST elevated myocardial infarction) (HCC) 05/02/2014  . Hypertension 05/02/2014  . Pulmonary edema cardiac cause (HCC) 05/02/2014     Past Surgical History:  Procedure Laterality Date  . LEFT HEART CATHETERIZATION WITH CORONARY ANGIOGRAM N/A 05/03/2014   Procedure: LEFT HEART CATHETERIZATION WITH CORONARY ANGIOGRAM;  Surgeon: Peter M Swaziland, MD;  Location: [facility] CATH LAB;  Service: Cardiovascular;  Laterality: N/A;  . PERCUTANEOUS CORONARY STENT INTERVENTION (PCI-S) Right 05/03/2014   Procedure: PERCUTANEOUS CORONARY STENT INTERVENTION (PCI-S);  Surgeon: Peter M Swaziland, MD;  Location: [facility] CATH LAB;  Service: Cardiovascular;  Laterality: Right;  . PERCUTANEOUS CORONARY STENT INTERVENTION (PCI-S) N/A 05/04/2014   Procedure: PERCUTANEOUS CORONARY STENT INTERVENTION (PCI-S);  Surgeon: Peter M Swaziland, MD;  Location: [facility] CATH LAB;  Service: Cardiovascular;  Laterality: N/A;    Allergies Cefuroxime axetil; Clonidine derivatives; Epinephrine; Penicillins; and Septra [sulfamethoxazole-trimethoprim]  Social History Social History   Tobacco Use  . Smoking status: Former Smoker    Types: Cigarettes  . Smokeless tobacco: Never Used  Substance Use Topics  . Alcohol use: No  . Drug use: No   Review of Systems Constitutional: Negative for fever. Cardiovascular: Negative for chest pain. Respiratory: Negative for shortness of breath. Gastrointestinal: Negative for abdominal pain, vomiting and diarrhea. Musculoskeletal: Negative for back pain.  Positive for hand swelling Skin: Negative for rash. Neurological: Negative for headaches, focal weakness or numbness.  Positive for tremor  All systems negative/normal/unremarkable except as stated in the HPI  ____________________________________________   PHYSICAL EXAM:  VITAL SIGNS: ED Triage Vitals  Enc Vitals Group     BP 11/04/18 1137 (!) 225/65     Pulse Rate 11/04/18 1137 79     Resp 11/04/18 1137 17     Temp 11/04/18 1137 99 F (37.2 C)  Temp Source 11/04/18 1137 Oral     SpO2 11/04/18 1137 94 %     Weight 11/04/18 1139 160 lb (72.6 kg)     Height 11/04/18 1139 5\' 2"   (1.575 m)     Head Circumference --      Peak Flow --      Pain Score 11/04/18 1138 0     Pain Loc --      Pain Edu? --      Excl. in GC? --    Constitutional: Alert and oriented. Well appearing and in no distress. Eyes: Conjunctivae are normal. Normal extraocular movements. ENT      Head: Normocephalic and atraumatic.      Nose: No congestion/rhinnorhea.      Mouth/Throat: Mucous membranes are moist.      Neck: No stridor. Cardiovascular: Normal rate, regular rhythm. No murmurs, rubs, or gallops. Respiratory: Normal respiratory effort without tachypnea nor retractions. Breath sounds are clear and equal bilaterally. No wheezes/rales/rhonchi. Gastrointestinal: Soft and nontender. Normal bowel sounds Musculoskeletal: Nontender with normal range of motion in extremities. No lower extremity tenderness nor edema. Neurologic:  Normal speech and language. No gross focal neurologic deficits are appreciated.  Skin:  Skin is warm, dry and intact. No rash noted. Psychiatric: Mood and affect are normal. Speech and behavior are normal.  ____________________________________________  EKG: Interpreted by me.  Sinus rhythm rate 74 bpm, LVH, normal axis, normal QT  ____________________________________________  ED COURSE:  As part of my medical decision making, I reviewed the following data within the electronic MEDICAL RECORD NUMBER History obtained from family if available, nursing notes, old chart and ekg, as well as notes from prior ED visits. Patient presented for tremor, we will assess with labs and imaging as indicated at this time.   Procedures ____________________________________________   LABS (pertinent positives/negatives)  Labs Reviewed  COMPREHENSIVE METABOLIC PANEL - Abnormal; Notable for the following components:      Result Value   Glucose, Bld 125 (*)    BUN 25 (*)    Albumin 3.4 (*)    GFR calc non Af Amer 49 (*)    GFR calc Af Amer 57 (*)    All other components within  normal limits  CBC WITH DIFFERENTIAL/PLATELET - Abnormal; Notable for the following components:   Hemoglobin 9.9 (*)    HCT 33.1 (*)    MCH 24.1 (*)    MCHC 29.9 (*)    RDW 15.7 (*)    All other components within normal limits  URINALYSIS, COMPLETE (UACMP) WITH MICROSCOPIC - Abnormal; Notable for the following components:   Color, Urine YELLOW (*)    APPearance CLEAR (*)    Glucose, UA 50 (*)    All other components within normal limits   ____________________________________________   DIFFERENTIAL DIAGNOSIS   Anxiety, dehydration, electrolyte abnormality, medication noncompliance, occult infection  FINAL ASSESSMENT AND PLAN  Tremor   Plan: The patient had presented for tremor which has resolved at the time of my evaluation. Patient's labs were reassuring.  Work-up here has been negative and she has no complaints at this time.  I will write for Ativan that she could try to take as needed.  Blood pressure is elevated but improving without any treatment.  Overall she is cleared for outpatient follow-up.   Ulice Dash, MD    Note: This note was generated in part or whole with voice recognition software. Voice recognition is usually quite accurate but there are transcription errors that can and  very often do occur. I apologize for any typographical errors that were not detected and corrected.     Emily Filbert, MD 11/04/18 [phone]

## 2018-11-04 NOTE — ED Triage Notes (Signed)
Pt c/o BL hand swelling with tremors for the past 2 days. Denies any recent illness.

## 2018-11-04 NOTE — ED Notes (Signed)
Sent Rainbow to lab. Gave patient urine cup with bag ,pt stated they will get sample when ready to bring to [place].

## 2018-11-20 DIAGNOSIS — Z0000 Encounter for general adult medical examination without abnormal findings: Secondary | ICD-10-CM | POA: Diagnosis not present

## 2018-11-20 DIAGNOSIS — E7800 Pure hypercholesterolemia, unspecified: Secondary | ICD-10-CM | POA: Diagnosis not present

## 2018-11-20 DIAGNOSIS — Z79899 Other long term (current) drug therapy: Secondary | ICD-10-CM | POA: Diagnosis not present

## 2018-11-20 DIAGNOSIS — R079 Chest pain, unspecified: Secondary | ICD-10-CM | POA: Diagnosis not present

## 2018-11-20 DIAGNOSIS — E039 Hypothyroidism, unspecified: Secondary | ICD-10-CM | POA: Diagnosis not present

## 2018-11-20 DIAGNOSIS — E1165 Type 2 diabetes mellitus with hyperglycemia: Secondary | ICD-10-CM | POA: Diagnosis not present

## 2018-11-20 DIAGNOSIS — I10 Essential (primary) hypertension: Secondary | ICD-10-CM | POA: Diagnosis not present

## 2018-11-21 ENCOUNTER — Encounter: Payer: Self-pay | Admitting: Physician Assistant

## 2018-11-21 ENCOUNTER — Ambulatory Visit (INDEPENDENT_AMBULATORY_CARE_PROVIDER_SITE_OTHER): Payer: PPO | Admitting: Physician Assistant

## 2018-11-21 VITALS — BP 184/68 | HR 87 | Ht 62.0 in | Wt 165.0 lb

## 2018-11-21 DIAGNOSIS — E119 Type 2 diabetes mellitus without complications: Secondary | ICD-10-CM | POA: Diagnosis not present

## 2018-11-21 DIAGNOSIS — I2510 Atherosclerotic heart disease of native coronary artery without angina pectoris: Secondary | ICD-10-CM

## 2018-11-21 DIAGNOSIS — I10 Essential (primary) hypertension: Secondary | ICD-10-CM

## 2018-11-21 DIAGNOSIS — I739 Peripheral vascular disease, unspecified: Secondary | ICD-10-CM | POA: Diagnosis not present

## 2018-11-21 DIAGNOSIS — R0789 Other chest pain: Secondary | ICD-10-CM

## 2018-11-21 DIAGNOSIS — E785 Hyperlipidemia, unspecified: Secondary | ICD-10-CM

## 2018-11-21 NOTE — Progress Notes (Signed)
Cardiology Office Note    Date:  11/23/2018   ID:  [name], [name] 1918/08/19, MRN [id number]  PCP:  Marguarite Arbour, MD  Cardiologist:  Dr. Swaziland  Chief Complaint  Patient presents with  . Follow-up    seen for Dr. Swaziland.     History of Present Illness:  Melinda Lewis is a 85 y.o. female with PMH of CAD, hypertension, DM 2, hyperlipidemia.  Patient was admitted in August 2015 with NSTEMI.  She had acute respiratory failure and was hypertensive.  EF was 35 to 40%.  Cardiac catheterization demonstrated severe disease in proximal to mid LAD in the mid left circumflex.  RCA had 80% disease in the mid vessel.  He underwent stenting of mid left circumflex and proximal and mid LAD.  RCA was managed medically.  Repeat echocardiogram in December 2015 showed normalization of EF.  Patient was seen again in September 2017 for atypical chest pain, echocardiogram was unchanged.  She has known SMA stenosis and moderate right iliac disease seen on previous CT.  Patient was last seen by Dr. Swaziland in December 2019, at which time she was doing well.  Patient was seen in the ED on 09/19/2022 hypertension.  She also had significant nausea at the time.  She was given Zofran.  Given asymptomatic nature for hypertension, she was not admitted.  She was seen in the emergency room again on 11/04/2022 hand swelling was tremors, work-up including urinalysis and lab work were negative.  She was given Ativan to take as needed.  Patient presents today for evaluation of atypical chest pain.  She says her chest pain occurred only once and is this was while she was sleeping in the bed.  She felt a shooting chest pain going from the left breast going toward the right breast and quickly faded after that.  The total duration of the chest pain was about a second.  There has been no recurrence of the symptoms since.  The symptom is not related to exertion.  No further work-up is necessary.  Her blood pressure remains  elevated, her PCP saw her yesterday has added Imdur to her medication list.  I will continue her on the current blood pressure medication and reassess in 3 to 4 weeks for further up titration.   Past Medical History:  Diagnosis Date  . Acute on chronic systolic CHF (congestive heart failure) (HCC) 06/16/2014  . Anemia   . CAD (coronary artery disease) 8/15   s/p DES LCX and LAD   . Cardiomyopathy, ischemic 06/16/2014  . Diabetes mellitus without complication (HCC)   . Hypertension   . NSTEMI (non-ST elevated myocardial infarction) (HCC)   . Thyroid disease   . TIA (transient ischemic attack) 2010    Past Surgical History:  Procedure Laterality Date  . LEFT HEART CATHETERIZATION WITH CORONARY ANGIOGRAM N/A 05/03/2014   Procedure: LEFT HEART CATHETERIZATION WITH CORONARY ANGIOGRAM;  Surgeon: Peter M Swaziland, MD;  Location: [facility] CATH LAB;  Service: Cardiovascular;  Laterality: N/A;  . PERCUTANEOUS CORONARY STENT INTERVENTION (PCI-S) Right 05/03/2014   Procedure: PERCUTANEOUS CORONARY STENT INTERVENTION (PCI-S);  Surgeon: Peter M Swaziland, MD;  Location: [facility] CATH LAB;  Service: Cardiovascular;  Laterality: Right;  . PERCUTANEOUS CORONARY STENT INTERVENTION (PCI-S) N/A 05/04/2014   Procedure: PERCUTANEOUS CORONARY STENT INTERVENTION (PCI-S);  Surgeon: Peter M Swaziland, MD;  Location: [facility] CATH LAB;  Service: Cardiovascular;  Laterality: N/A;    Current Medications: Outpatient Medications Prior to Visit  Medication Sig Dispense  Refill  . aspirin EC 81 MG tablet Take 81 mg by mouth daily.    Marland Kitchen atorvastatin (LIPITOR) 40 MG tablet TAKE 1 TABLET BY MOUTH DAILY AT 6PM 90 tablet 2  . cefdinir (OMNICEF) 300 MG capsule Take 1 capsule (300 mg total) by mouth every 12 (twelve) hours. 10 capsule 0  . Flaxseed, Linseed, (FLAX SEED OIL PO) Take 1 tablet by mouth daily.    . hydrALAZINE (APRESOLINE) 50 MG tablet Take 1 tablet (50 mg total) by mouth daily as needed (For Systolic BP>200). 30 tablet 0  . L-Arginine  1000 MG TABS Take 1 tablet by mouth 3 (three) times daily.    Marland Kitchen levothyroxine (SYNTHROID, LEVOTHROID) 100 MCG tablet TAKE 1 TABLET EVERY DAY ON EMPTY STOMACHWITH A GLASS OF WATER AT LEAST 30-60 MINBEFORE BREAKFAST    . lisinopril (PRINIVIL,ZESTRIL) 40 MG tablet Take 1 tablet (40 mg total) by mouth daily. 30 tablet 0  . LORazepam (ATIVAN) 0.5 MG tablet Take 1 tablet (0.5 mg total) by mouth every 8 (eight) hours as needed for anxiety. 20 tablet 0  . magnesium oxide (MAG-OX) 400 MG tablet Take 400 mg by mouth daily.     . metFORMIN (GLUCOPHAGE) 500 MG tablet Take 500 mg by mouth daily.     . metoprolol succinate (TOPROL-XL) 25 MG 24 hr tablet TAKE 1 TABLET BY MOUTH DAILY (Patient taking differently: Take 25 mg by mouth 2 (two) times daily. ) 90 tablet 3  . nitroGLYCERIN (NITROSTAT) 0.4 MG SL tablet Place 1 tablet (0.4 mg total) under the tongue every 5 (five) minutes as needed for chest pain. 90 tablet 3  . ondansetron (ZOFRAN ODT) 4 MG disintegrating tablet Take 1 tablet (4 mg total) by mouth every 8 (eight) hours as needed for nausea or vomiting. 20 tablet 0  . pantoprazole (PROTONIX) 40 MG tablet Take 40 mg by mouth daily.    . QUEtiapine (SEROQUEL) 25 MG tablet Take 1 tablet by mouth at bedtime.    . isosorbide mononitrate (IMDUR) 30 MG 24 hr tablet Take 30 mg by mouth daily.     No facility-administered medications prior to visit.      Allergies:   Cefuroxime axetil; Clonidine derivatives; Epinephrine; Penicillins; and Septra [sulfamethoxazole-trimethoprim]   Social History   Socioeconomic History  . Marital status: Widowed    Spouse name: Not on file  . Number of children: Not on file  . Years of education: Not on file  . Highest education level: Not on file  Occupational History  . Not on file  Social Needs  . Financial resource strain: Not on file  . Food insecurity:    Worry: Not on file    Inability: Not on file  . Transportation needs:    Medical: Not on file     Non-medical: Not on file  Tobacco Use  . Smoking status: Former Smoker    Types: Cigarettes  . Smokeless tobacco: Never Used  Substance and Sexual Activity  . Alcohol use: No  . Drug use: No  . Sexual activity: Not on file  Lifestyle  . Physical activity:    Days per week: Not on file    Minutes per session: Not on file  . Stress: Not on file  Relationships  . Social connections:    Talks on phone: Not on file    Gets together: Not on file    Attends religious service: Not on file    Active member of club or organization: Not on  file    Attends meetings of clubs or organizations: Not on file    Relationship status: Not on file  Other Topics Concern  . Not on file  Social History Narrative  . Not on file     Family History:  The patient's family history is not on file.   ROS:   Please see the history of present illness.    ROS All other systems reviewed and are negative.   PHYSICAL EXAM:   VS:  BP (!) 184/68   Pulse 87   Ht 5\' 2"  (1.575 m)   Wt 165 lb (74.8 kg)   BMI 30.18 kg/m    GEN: Well nourished, well developed, in no acute distress  HEENT: normal  Neck: no JVD, carotid bruits, or masses Cardiac: RRR; no murmurs, rubs, or gallops,no edema  Respiratory:  clear to auscultation bilaterally, normal work of breathing GI: soft, nontender, nondistended, + BS MS: no deformity or atrophy  Skin: warm and dry, no rash Neuro:  Alert and Oriented x 3, Strength and sensation are intact Psych: euthymic mood, full affect  Wt Readings from Last 3 Encounters:  11/21/18 165 lb (74.8 kg)  11/04/18 160 lb (72.6 kg)  09/19/18 165 lb (74.8 kg)      Studies/Labs Reviewed:   EKG:  EKG is ordered today.  The ekg ordered today demonstrates NSR without significant ST-T wave changes  Recent Labs: 09/05/2018: TSH 0.351 11/04/2018: ALT 17; BUN 25; Creatinine, Ser 0.96; Hemoglobin 9.9; Platelets 326; Potassium 3.8; Sodium 140   Lipid Panel    Component Value Date/Time   CHOL  157 05/02/2014 0920   TRIG 164 (H) 05/02/2014 0920   HDL 50 05/02/2014 0920   CHOLHDL 3.1 05/02/2014 0920   VLDL 33 05/02/2014 0920   LDLCALC 74 05/02/2014 0920    Additional studies/ records that were reviewed today include:   Echo 06/14/2016 LV EF: 60% -   65%  ------------------------------------------------------------------- Indications:      CAD (I25.118).  ------------------------------------------------------------------- History:   PMH:  Hyperlipidemia.  Transient ischemic attack.  Risk factors:  Hypertension. Diabetes mellitus.  ------------------------------------------------------------------- Study Conclusions  - Left ventricle: The cavity size was normal. Wall thickness was   increased in a pattern of mild LVH. Systolic function was normal.   The estimated ejection fraction was in the range of 60% to 65%.   Wall motion was normal; there were no regional wall motion   abnormalities. Doppler parameters are consistent with abnormal   left ventricular relaxation (grade 1 diastolic dysfunction). - Mitral valve: Calcified annulus. - Tricuspid valve: There was mild regurgitation. - Pulmonary arteries: Systolic pressure was mildly increased. PA   peak pressure: 39 mm Hg (S).  Impressions:  - Compared to the prior study, there has been no significant   interval change.   ASSESSMENT:    1. Atypical chest pain   2. Coronary artery disease involving native coronary artery of native heart without angina pectoris   3. Essential hypertension   4. Hyperlipidemia LDL goal <70   5. Controlled type 2 diabetes mellitus without complication, without long-term current use of insulin (HCC)   6. PAD (peripheral artery disease) (HCC)      PLAN:  In order of problems listed above:  1. Atypical chest pain: There has been only one episode of chest pain and that lasted roughly 1 second before resolving.  No further work-up is necessary given the atypical nature  2. CAD:  On aspirin and Lipitor.  Other than  that one episode of chest pain, she is actually quite independent at home.  3. Hypertension: Uncontrolled recently.  Primary care provider has added Imdur 30 mg daily to her medical regimen.  I will hold off on adjusting the blood pressure medication further.  4. Hyperlipidemia: On Lipitor 40 mg daily.  5. DM2: Managed by primary care provider  6. PAD: known stenosis in the SMA and iliac.  Asymptomatic.    Medication Adjustments/Labs and Tests Ordered: Current medicines are reviewed at length with the patient today.  Concerns regarding medicines are outlined above.  Medication changes, Labs and Tests ordered today are listed in the Patient Instructions below. Patient Instructions  Medication Instructions:  Your physician recommends that you continue on your current medications as directed. Please refer to the Current Medication list given to you today.  If you need a refill on your cardiac medications before your next appointment, please call your pharmacy.   Lab work: NONE  If you have labs (blood work) drawn today and your tests are completely normal, you will receive your results only by: Marland Kitchen MyChart Message (if you have MyChart) OR . A paper copy in the mail If you have any lab test that is abnormal or we need to change your treatment, we will call you to review the results.  Testing/Procedures: NONE  Follow-Up: . Your physician recommends that you KEEP your follow-up appointment with Azalee Course, PA-C as scheduled   Any Other Special Instructions Will Be Listed Below (If Applicable).       Ramond Dial, [place]  11/23/2018 11:52 PM    [facility] [address] [place], [place], [place]  [address] Phone: [phone]; Fax: [phone]

## 2018-11-21 NOTE — Patient Instructions (Signed)
Medication Instructions:  Your physician recommends that you continue on your current medications as directed. Please refer to the Current Medication list given to you today.  If you need a refill on your cardiac medications before your next appointment, please call your pharmacy.   Lab work: NONE  If you have labs (blood work) drawn today and your tests are completely normal, you will receive your results only by: Marland Kitchen MyChart Message (if you have MyChart) OR . A paper copy in the mail If you have any lab test that is abnormal or we need to change your treatment, we will call you to review the results.  Testing/Procedures: NONE  Follow-Up: . Your physician recommends that you KEEP your follow-up appointment with Azalee Course, PA-C as scheduled   Any Other Special Instructions Will Be Listed Below (If Applicable).

## 2018-11-23 ENCOUNTER — Encounter: Payer: Self-pay | Admitting: Physician Assistant

## 2018-11-26 ENCOUNTER — Telehealth: Payer: Self-pay | Admitting: Physician Assistant

## 2018-11-26 NOTE — Telephone Encounter (Signed)
Records received from [facility] on 11/26/18, Appt 12/18/18 @ 10:30AM.[place]

## 2018-12-09 ENCOUNTER — Other Ambulatory Visit: Payer: Self-pay | Admitting: Cardiology

## 2018-12-17 ENCOUNTER — Telehealth: Payer: Self-pay

## 2018-12-17 NOTE — Telephone Encounter (Signed)
Called the patient to get her to switch to a tele-health visit. While on the telephone with the patient she could not understand me and stated that she was passing the telephone to her son. Mr. Dobner  Was given permission to speak with me from his mother he stated that a telephone visit would be fine and that if is was possible that it could be done now. Explained that the provider was not avaiable at this time that we can keep her scheduled appointment time for tomorrow 4/2 at 10:30 he stated no that someone has to be there with his mother since she is hard of hearing. He called one if his sisters to have one of them to be there in the morning for the call

## 2018-12-17 NOTE — Telephone Encounter (Signed)
Virtual Visit Pre-Appointment Phone Call  Steps For Call:  1. Confirm consent - "In the setting of the current Covid19 crisis, you are scheduled for a (phone or video) visit with your provider on (date) at (time).  Just as we do with many in-office visits, in order for you to participate in this visit, we must obtain consent.  If you'd like, I can send this to your mychart (if signed up) or email for you to review.  Otherwise, I can obtain your verbal consent now.  All virtual visits are billed to your insurance company just like a normal visit would be.  By agreeing to a virtual visit, we'd like you to understand that the technology does not allow for your provider to perform an examination, and thus may limit your provider's ability to fully assess your condition.  Finally, though the technology is pretty good, we cannot assure that it will always work on either your or our end, and in the setting of a video visit, we may have to convert it to a phone-only visit.  In either situation, we cannot ensure that we have a secure connection.  Are you willing to proceed?"  2. Give patient instructions for WebEx download to smartphone as below if video visit  3. Advise patient to be prepared with any vital sign or heart rhythm information, their current medicines, and a piece of paper and pen handy for any instructions they may receive the day of their visit  4. Inform patient they will receive a phone call 15 minutes prior to their appointment time (may be from unknown caller ID) so they should be prepared to answer  5. Confirm that appointment type is correct in Epic appointment notes (video vs telephone)    TELEPHONE CALL NOTE  Melinda Lewis has been deemed a candidate for a follow-up tele-health visit to limit community exposure during the Covid-19 pandemic. I spoke with the patient via phone to ensure availability of phone/video source, confirm preferred email & phone number, and discuss  instructions and expectations.  I reminded Melinda Lewis to be prepared with any vital sign and/or heart rhythm information that could potentially be obtained via home monitoring, at the time of her visit. I reminded Melinda Lewis to expect a phone call at the time of her visit if her visit.  Did the patient verbally acknowledge consent to treatment? Yes  Dorris Fetch, [facility] 12/17/2018 3:48 PM   DOWNLOADING THE WEBEX SOFTWARE TO SMARTPHONE  - If Apple, go to [employer] and type in WebEx in the search bar. Download [employer], the blue/green circle. The app is free but as with any other app downloads, their phone may require them to verify saved payment information or Apple password. The patient does NOT have to create an account.  - If Android, ask patient to go to [employer] and type in WebEx in the search bar. Download [employer], the blue/green circle. The app is free but as with any other app downloads, their phone may require them to verify saved payment information or Android password. The patient does NOT have to create an account.   CONSENT FOR TELE-HEALTH VISIT - PLEASE REVIEW  I hereby voluntarily request, consent and authorize [facility] and its employed or contracted physicians, physician assistants, nurse practitioners or other licensed health care professionals (the Practitioner), to provide me with telemedicine health care services (the "Services") as deemed necessary by the treating Practitioner. I  acknowledge and consent to receive the Services by the Practitioner via telemedicine. I understand that the telemedicine visit will involve communicating with the Practitioner through live audiovisual communication technology and the disclosure of certain medical information by electronic transmission. I acknowledge that I have been given the opportunity to request an in-person assessment or other available alternative prior to the telemedicine visit and  am voluntarily participating in the telemedicine visit.  I understand that I have the right to withhold or withdraw my consent to the use of telemedicine in the course of my care at any time, without affecting my right to future care or treatment, and that the Practitioner or I may terminate the telemedicine visit at any time. I understand that I have the right to inspect all information obtained and/or recorded in the course of the telemedicine visit and may receive copies of available information for a reasonable fee.  I understand that some of the potential risks of receiving the Services via telemedicine include:  Marland Kitchen Delay or interruption in medical evaluation due to technological equipment failure or disruption; . Information transmitted may not be sufficient (e.g. poor resolution of images) to allow for appropriate medical decision making by the Practitioner; and/or  . In rare instances, security protocols could fail, causing a breach of personal health information.  Furthermore, I acknowledge that it is my responsibility to provide information about my medical history, conditions and care that is complete and accurate to the best of my ability. I acknowledge that Practitioner's advice, recommendations, and/or decision may be based on factors not within their control, such as incomplete or inaccurate data provided by me or distortions of diagnostic images or specimens that may result from electronic transmissions. I understand that the practice of medicine is not an exact science and that Practitioner makes no warranties or guarantees regarding treatment outcomes. I acknowledge that I will receive a copy of this consent concurrently upon execution via email to the email address I last provided but may also request a printed copy by calling the office of [place].    I understand that my insurance will be billed for this visit.   I have read or had this consent read to me. . I understand the  contents of this consent, which adequately explains the benefits and risks of the Services being provided via telemedicine.  . I have been provided ample opportunity to ask questions regarding this consent and the Services and have had my questions answered to my satisfaction. . I give my informed consent for the services to be provided through the use of telemedicine in my medical care  By participating in this telemedicine visit I agree to the above.

## 2018-12-17 NOTE — Telephone Encounter (Signed)
° °  Cardiac Questionnaire: °  ° Since your last visit or hospitalization: ° °  1. Have you been having new or worsening chest pain? No °  2. Have you been having new or worsening shortness of breath? No °3. Have you been having new or worsening leg swelling, wt gain, or increase in abdominal girth (pants fitting more tightly)? No °  4. Have you had any passing out spells? No °   °*A YES to any of these questions would result in the appointment being kept. °*If all the answers to these questions are NO, we should indicate that given the current situation regarding the worldwide coronarvirus pandemic, at the recommendation of the CDC, we are looking to limit gatherings in our waiting area, and thus will reschedule their appointment beyond four weeks from today.   °_____________  ° °COVID-19 Pre-Screening Questions: ° °• Do you currently have a fever? No (yes = cancel and refer to pcp for e-visit) °• Have you recently travelled on a cruise, internationally, or to [place], [place], [place], [place], [place], or [place], [place] ([place]) ? No (yes = cancel, stay home, monitor symptoms, and contact pcp or initiate e-visit if symptoms develop) °• Have you been in contact with someone that is currently pending confirmation of Covid19 testing or has been confirmed to have the Covid19 virus No (yes = cancel, stay home, away from tested individual, monitor symptoms, and contact pcp or initiate e-visit if symptoms develop) °• Are you currently experiencing fatigue or cough? No (yes = pt should be prepared to have a mask placed at the time of their visit). ° ° °   ° ° ° ° °

## 2018-12-18 ENCOUNTER — Telehealth: Payer: PPO | Admitting: Physician Assistant

## 2018-12-18 ENCOUNTER — Ambulatory Visit: Payer: PPO | Admitting: Physician Assistant

## 2018-12-18 ENCOUNTER — Telehealth (INDEPENDENT_AMBULATORY_CARE_PROVIDER_SITE_OTHER): Payer: PPO | Admitting: Physician Assistant

## 2018-12-18 DIAGNOSIS — E119 Type 2 diabetes mellitus without complications: Secondary | ICD-10-CM | POA: Diagnosis not present

## 2018-12-18 DIAGNOSIS — I2510 Atherosclerotic heart disease of native coronary artery without angina pectoris: Secondary | ICD-10-CM | POA: Diagnosis not present

## 2018-12-18 DIAGNOSIS — I10 Essential (primary) hypertension: Secondary | ICD-10-CM | POA: Diagnosis not present

## 2018-12-18 NOTE — Progress Notes (Signed)
Virtual Visit via Telephone Note    Evaluation Performed:  Follow-up visit  This visit type was conducted due to national recommendations for restrictions regarding the COVID-19 Pandemic (e.g. social distancing).  This format is felt to be most appropriate for this patient at this time.  All issues noted in this document were discussed and addressed.  No physical exam was performed (except for noted visual exam findings with Video Visits).  Please refer to the patient's chart (MyChart message for video visits and phone note for telephone visits) for the patient's consent to telehealth for [facility].  Date:  12/18/2018   ID:  Melinda Lewis, DOB [birth year], MRN [id number]  Patient Location:  Home  Provider location:   [place]  PCP:  Marguarite Arbour, MD  Cardiologist:  Peter Swaziland, MD  Electrophysiologist:  None   Chief Complaint:  High Blood pressure followup  History of Present Illness:    Melinda Lewis is a 85 y.o. female who presents via audio/video conferencing for a telehealth visit today.    The patient does not have symptoms concerning for COVID-19 infection (fever, chills, cough, or new shortness of breath).    Prior CV studies:   The following studies were reviewed today:  Melinda Lewis is a 85 y.o. female with PMH of CAD, hypertension, DM 2, hyperlipidemia.  Patient was admitted in August 2015 with NSTEMI.  She had acute respiratory failure and was hypertensive.  EF was 35 to 40%.  Cardiac catheterization demonstrated severe disease in proximal to mid LAD in the mid left circumflex.  RCA had 80% disease in the mid vessel.  He underwent stenting of mid left circumflex and proximal and mid LAD.  RCA was managed medically.  Repeat echocardiogram in December 2015 showed normalization of EF.  Patient was seen again in September 2017 for atypical chest pain, echocardiogram was unchanged.  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  Patient was last seen by Dr. Swaziland in December 2019, at which time she was doing well.  Patient was seen in the ED on 09/19/2022 hypertension.  She also had significant nausea at the time.  She was given Zofran.  Given asymptomatic nature for hypertension, she was not admitted.  She was seen in the emergency room again on 11/04/2022 hand swelling was tremors, work-up including urinalysis and lab work were negative.  She was given Ativan to take as needed.  I saw Melinda Lewis on 11/21/2018 for atypical chest pain.  She complained of a very atypical chest discomfort that occurred only once and while she was laying in bed.  The chest discomfort was quite transient.  The total duration of the chest pain was only about a second and that there was no recurrence since.  I did not recommend any ischemic work-up for this 85 year old female with very atypical chest pain.  Her blood pressure was elevated.  Her primary care provider added Imdur to her medication list.  She was contacted through telephone visit today for reassessment of her blood pressure.   Past Medical History:  Diagnosis Date  . Acute on chronic systolic CHF (congestive heart failure) (HCC) 06/16/2014  . Anemia   . CAD (coronary artery disease) 8/15   s/p DES LCX and LAD   . Cardiomyopathy, ischemic 06/16/2014  . Diabetes mellitus without complication (HCC)   . Hypertension   . NSTEMI (non-ST elevated myocardial infarction) (HCC)   . Thyroid disease   .  TIA (transient ischemic attack) 2010   Past Surgical History:  Procedure Laterality Date  . LEFT HEART CATHETERIZATION WITH CORONARY ANGIOGRAM N/A 05/03/2014   Procedure: LEFT HEART CATHETERIZATION WITH CORONARY ANGIOGRAM;  Surgeon: Peter M Swaziland, MD;  Location[facility] CATH LAB;  Service: Cardiovascular;  Laterality: N/A;  . PERCUTANEOUS CORONARY STENT INTERVENTION (PCI-S) Right 05/03/2014   Procedure: PERCUTANEOUS CORONARY STENT INTERVENTION (PCI-S);  Surgeon: Peter M Swaziland, MD;   Location: [facility] CATH LAB;  Service: Cardiovascular;  Laterality: Right;  . PERCUTANEOUS CORONARY STENT INTERVENTION (PCI-S) N/A 05/04/2014   Procedure: PERCUTANEOUS CORONARY STENT INTERVENTION (PCI-S);  Surgeon: Peter M Swaziland, MD;  Location: [facility] CATH LAB;  Service: Cardiovascular;  Laterality: N/A;     No outpatient medications have been marked as taking for the 12/18/18 encounter (Telemedicine) with Azalee Course, PA.     Allergies:   Cefuroxime axetil; Clonidine derivatives; Epinephrine; Penicillins; and Septra [sulfamethoxazole-trimethoprim]   Social History   Tobacco Use  . Smoking status: Former Smoker    Types: Cigarettes  . Smokeless tobacco: Never Used  Substance Use Topics  . Alcohol use: No  . Drug use: No     Family Hx: The patient's family history is not on file.  ROS:   Please see the history of present illness.     All other systems reviewed and are negative.   Labs/Other Tests and Data Reviewed:    Recent Labs: 09/05/2018: TSH 0.351 11/04/2018: ALT 17; BUN 25; Creatinine, Ser 0.96; Hemoglobin 9.9; Platelets 326; Potassium 3.8; Sodium 140   Recent Lipid Panel Lab Results  Component Value Date/Time   CHOL 157 05/02/2014 09:20 AM   TRIG 164 (H) 05/02/2014 09:20 AM   HDL 50 05/02/2014 09:20 AM   CHOLHDL 3.1 05/02/2014 09:20 AM   LDLCALC 74 05/02/2014 09:20 AM    Wt Readings from Last 3 Encounters:  11/21/18 165 lb (74.8 kg)  11/04/18 160 lb (72.6 kg)  09/19/18 165 lb (74.8 kg)     Objective:    Vital Signs:  There were no vitals taken for this visit.   Well nourished, well developed female in no acute distress.   ASSESSMENT & PLAN:    1.  CAD  -No further chest discomfort.  Continue aspirin and statin  2. Hypertension: Imdur was added to her medical regiment prior to her last office visit by her PCP for elevated blood pressure.  I recommended increasing Imdur to 60 mg daily if her systolic blood pressure is persistently above 150.  Otherwise she can  continue on her current medication  3. Hyperlipidemia: On Lipitor 40 mg daily  4. DM 2: Managed by primary care provider.  Continue metformin   COVID-19 Education: The signs and symptoms of COVID-19 were discussed with the patient and how to seek care for testing (follow up with PCP or arrange E-visit).  The importance of social distancing was discussed today.  Patient Risk:   After full review of this patient's clinical status, I feel that they are at least moderate risk at this time.  Time:   Today, I have spent 9 minutes with the patient with telehealth technology discussing BP medication and BP goal 130-140s.     Medication Adjustments/Labs and Tests Ordered: Current medicines are reviewed at length with the patient today.  Concerns regarding medicines are outlined above.  Tests Ordered: No orders of the defined types were placed in this encounter.  Medication Changes: No orders of the defined types were placed in this encounter.  Disposition:  Follow up in 6 month(s)  Signed, Azalee Course, [place]  12/18/2018 9:28 AM    [facility]

## 2018-12-18 NOTE — Patient Instructions (Signed)
Medication Instructions:   INCREASE Imdur to 60 Mg daily if the top number of blood pressure is greater than 150 for multiple days  If you need a refill on your cardiac medications before your next appointment, please call your pharmacy.   Lab work: NONE  If you have labs (blood work) drawn today and your tests are completely normal, you will receive your results only by: Marland Kitchen MyChart Message (if you have MyChart) OR . A paper copy in the mail If you have any lab test that is abnormal or we need to change your treatment, we will call you to review the results.  Testing/Procedures: NONE  Follow-Up: At [facility], you and your health needs are our priority.  As part of our continuing mission to provide you with exceptional heart care, we have created designated Provider Care Teams.  These Care Teams include your primary Cardiologist (physician) and Advanced Practice Providers (APPs -  Physician Assistants and Nurse Practitioners) who all work together to provide you with the care you need, when you need it. You will need a follow up appointment in 6 months.  Please call our office 2 months in advance to schedule this appointment.  You may see No primary care provider on file. or one of the following Advanced Practice Providers on your designated Care Team: Azalee Course, [place] . Micah Flesher, PA-C  Any Other Special Instructions Will Be Listed Below (If Applicable). INCREASE Imdur to 60 Mg daily if the top number of blood pressure is greater than 150 for multiple days

## 2018-12-23 ENCOUNTER — Encounter: Payer: Self-pay | Admitting: *Deleted

## 2018-12-23 ENCOUNTER — Other Ambulatory Visit: Payer: Self-pay | Admitting: *Deleted

## 2018-12-23 NOTE — Patient Outreach (Signed)
HTA HRA follow up call. Pt is very HOH and she gave me permission to speak with her son Melinda Lewis. Provided information about [facility] care management services. Discussed item on her HRA that were pertinent: trips to ED (they were warranted for CP), diabetes and HTN. Also addressed that she had HCPOA but not a living will or a MOST form which is most important for her wishes to be know.  Son gave permission for documents to be sent and for her to be including in calls from a health coach every 3 months.  Will send a successful outreach letter with enclosures.  Melinda Lewis. [name], MSN, [facility] Gerontological Nurse Practitioner [facility] Care Management [phone]

## 2019-01-02 ENCOUNTER — Other Ambulatory Visit: Payer: Self-pay | Admitting: *Deleted

## 2019-01-30 ENCOUNTER — Ambulatory Visit: Payer: Self-pay | Admitting: *Deleted

## 2019-02-20 ENCOUNTER — Telehealth: Payer: Self-pay | Admitting: Cardiology

## 2019-02-20 DIAGNOSIS — R5383 Other fatigue: Secondary | ICD-10-CM | POA: Diagnosis not present

## 2019-02-20 DIAGNOSIS — I10 Essential (primary) hypertension: Secondary | ICD-10-CM | POA: Diagnosis not present

## 2019-02-20 DIAGNOSIS — R8271 Bacteriuria: Secondary | ICD-10-CM | POA: Diagnosis not present

## 2019-02-20 NOTE — Telephone Encounter (Signed)
Mychart pending, no smartphone, consent, pre reg complete 02/20/19 AF

## 2019-02-24 NOTE — Progress Notes (Signed)
Virtual Visit via Telephone Note   This visit type was conducted due to national recommendations for restrictions regarding the COVID-19 Pandemic (e.g. social distancing) in an effort to limit this patient's exposure and mitigate transmission in our community.  Due to her co-morbid illnesses, this patient is at least at moderate risk for complications without adequate follow up.  This format is felt to be most appropriate for this patient at this time.  The patient did not have access to video technology/had technical difficulties with video requiring transitioning to audio format only (telephone).  All issues noted in this document were discussed and addressed.  No physical exam could be performed with this format.  Please refer to the patient's chart for her  consent to telehealth for [facility].   Date:  02/27/2019   ID:  [name], DOB 19-Feb-1918, MRN [id number]  Patient Location: Home Provider Location: Home  PCP:  Marguarite Arbour, MD  Cardiologist:  Odette Watanabe [place], MD  Electrophysiologist:  None   Evaluation Performed:  Follow-Up Visit  Chief Complaint:  HTN and chest pain  History of Present Illness:    Melinda Lewis is a 85 y.o. female with PMH of CAD, hypertension, DM 2, hyperlipidemia.  Patient was admitted in August 2015 with NSTEMI.  She had acute respiratory failure and was hypertensive.  EF was 35 to 40%.  Cardiac catheterization demonstrated severe disease in proximal to mid LAD in the mid left circumflex.  RCA had 80% disease in the mid vessel.  He underwent stenting of mid left circumflex and proximal and mid LAD.  RCA was managed medically.  Repeat echocardiogram in December 2015 showed normalization of EF.  Patient was seen again in September 2017 for atypical chest pain, echocardiogram was unchanged.  She has known SMA stenosis and moderate right iliac disease seen on previous CT.  Patient was last seen by Dr. Swaziland in December 2019, at which time she was  doing well.  Patient was seen in the ED on 09/19/2018 hypertension.  She also had significant nausea at the time.  She was given Zofran.  Given asymptomatic nature for hypertension, she was not admitted.  She was seen in the emergency room again on 11/04/2018 hand swelling was tremors, work-up including urinalysis and lab work were negative.    She was subsequently seen by Azalee Course PA-C for evaluation of atypical CP. She was started on Imdur for this and HTN.   I spoke to her over the phone today with her sister who has moved from [place] to live with her. Her sister is helping to take care of her. She reports her BP is doing much better. She has had a couple of high readings and one low reading but generally doing well. She has some memory loss. They report that Dr Judithann Sheen has done lab work to evaluate. MRI done in November showed microvascular disease and small infarcts. No acute findings. She does have occasional chest pain relieved with sl Ntg.   The patient does not have symptoms concerning for COVID-19 infection (fever, chills, cough, or new shortness of breath).    Past Medical History:  Diagnosis Date  . Acute on chronic systolic CHF (congestive heart failure) (HCC) 06/16/2014  . Anemia   . CAD (coronary artery disease) 8/15   s/p DES LCX and LAD   . Cardiomyopathy, ischemic 06/16/2014  . Diabetes mellitus without complication (HCC)   . Hypertension   . NSTEMI (non-ST elevated myocardial infarction) (HCC)   .  Thyroid disease   . TIA (transient ischemic attack) 2010   Past Surgical History:  Procedure Laterality Date  . LEFT HEART CATHETERIZATION WITH CORONARY ANGIOGRAM N/A 05/03/2014   Procedure: LEFT HEART CATHETERIZATION WITH CORONARY ANGIOGRAM;  Surgeon: Jalana Moore M Martinique, MD;  [facility] CATH LAB;  Service: Cardiovascular;  Laterality: N/A;  . PERCUTANEOUS CORONARY STENT INTERVENTION (PCI-S) Right 05/03/2014   Procedure: PERCUTANEOUS CORONARY STENT INTERVENTION (PCI-S);  Surgeon: Cara Aguino  M Martinique, MD;  Location: [facility] CATH LAB;  Service: Cardiovascular;  Laterality: Right;  . PERCUTANEOUS CORONARY STENT INTERVENTION (PCI-S) N/A 05/04/2014   Procedure: PERCUTANEOUS CORONARY STENT INTERVENTION (PCI-S);  Surgeon: Luva Metzger M Martinique, MD;  Location: [facility] CATH LAB;  Service: Cardiovascular;  Laterality: N/A;     Current Meds  Medication Sig  . aspirin EC 81 MG tablet Take 81 mg by mouth daily.  Marland Kitchen atorvastatin (LIPITOR) 40 MG tablet TAKE 1 TABLET BY MOUTH DAILY AT 6PM  . cefdinir (OMNICEF) 300 MG capsule Take 1 capsule (300 mg total) by mouth every 12 (twelve) hours.  . Flaxseed, Linseed, (FLAX SEED OIL PO) Take 1 tablet by mouth daily.  . isosorbide mononitrate (IMDUR) 30 MG 24 hr tablet Take 30 mg by mouth daily.  Marland Kitchen L-Arginine 1000 MG TABS Take 1 tablet by mouth 3 (three) times daily.  Marland Kitchen levothyroxine (SYNTHROID) 150 MCG tablet Take 1 tablet by mouth daily.  Marland Kitchen lisinopril (PRINIVIL,ZESTRIL) 40 MG tablet Take 1 tablet (40 mg total) by mouth daily.  Marland Kitchen LORazepam (ATIVAN) 0.5 MG tablet Take 1 tablet (0.5 mg total) by mouth every 8 (eight) hours as needed for anxiety.  . magnesium oxide (MAG-OX) 400 MG tablet Take 400 mg by mouth daily.   . Melatonin 5 MG TABS Take 1 tablet by mouth at bedtime.  . metFORMIN (GLUCOPHAGE) 500 MG tablet Take 500 mg by mouth daily.   . metoprolol succinate (TOPROL-XL) 25 MG 24 hr tablet TAKE ONE TABLET BY MOUTH EVERY DAY  . ondansetron (ZOFRAN ODT) 4 MG disintegrating tablet Take 1 tablet (4 mg total) by mouth every 8 (eight) hours as needed for nausea or vomiting.  . pantoprazole (PROTONIX) 40 MG tablet Take 40 mg by mouth daily.  . QUEtiapine (SEROQUEL) 25 MG tablet Take 1 tablet by mouth at bedtime.     Allergies:   Cefuroxime axetil, Clonidine derivatives, Epinephrine, Penicillins, and Septra [sulfamethoxazole-trimethoprim]   Social History   Tobacco Use  . Smoking status: Former Smoker    Types: Cigarettes  . Smokeless tobacco: Never Used  Substance Use  Topics  . Alcohol use: No  . Drug use: No     Family Hx: The patient's family history is not on file.  ROS:   Please see the history of present illness.    All other systems reviewed and are negative.   Prior CV studies:   The following studies were reviewed today:  none  Labs/Other Tests and Data Reviewed:    EKG:  No ECG reviewed.  Recent Labs: 09/05/2018: TSH 0.351 11/04/2018: ALT 17; BUN 25; Creatinine, Ser 0.96; Hemoglobin 9.9; Platelets 326; Potassium 3.8; Sodium 140   Recent Lipid Panel Lab Results  Component Value Date/Time   CHOL 157 05/02/2014 09:20 AM   TRIG 164 (H) 05/02/2014 09:20 AM   HDL 50 05/02/2014 09:20 AM   CHOLHDL 3.1 05/02/2014 09:20 AM   LDLCALC 74 05/02/2014 09:20 AM    Wt Readings from Last 3 Encounters:  11/21/18 165 lb (74.8 kg)  11/04/18 160 lb (72.6 kg)  09/19/18 165 lb (74.8 kg)     Objective:    Vital Signs:  There were no vitals taken for this visit.   VITAL SIGNS:  reviewed  ASSESSMENT & PLAN:    1.  CAD: stable class 1-2 angina. On aspirin, Imdur, metoprolol,  and Lipitor. Continue prn sl Ntg.   2.  Hypertension: per family report BP control has improved.   3.  Hyperlipidemia: On Lipitor 40 mg daily. Labs followed by primary care  4. DM2: Managed by primary care provider  5. PAD: known stenosis in the SMA and iliac. No limiting claudication or wounds  6. Memory loss. Most likely related to age and microvascular disease. Await results of recent lab work   COVID-19 Education: The signs and symptoms of COVID-19 were discussed with the patient and how to seek care for testing (follow up with PCP or arrange E-visit).  The importance of social distancing was discussed today.  Time:   Today, I have spent 15 minutes with the patient with telehealth technology discussing the above problems.     Medication Adjustments/Labs and Tests Ordered: Current medicines are reviewed at length with the patient today.  Concerns  regarding medicines are outlined above.   Tests Ordered: No orders of the defined types were placed in this encounter.   Medication Changes: No orders of the defined types were placed in this encounter.   Disposition:  Follow up in 4 month(s)  Signed, Micahel Omlor SwazilandJordan, MD  02/27/2019 2:30 PM    [facility]

## 2019-02-25 DIAGNOSIS — E039 Hypothyroidism, unspecified: Secondary | ICD-10-CM | POA: Diagnosis not present

## 2019-02-25 DIAGNOSIS — E1165 Type 2 diabetes mellitus with hyperglycemia: Secondary | ICD-10-CM | POA: Diagnosis not present

## 2019-02-25 DIAGNOSIS — Z79899 Other long term (current) drug therapy: Secondary | ICD-10-CM | POA: Diagnosis not present

## 2019-02-25 DIAGNOSIS — I10 Essential (primary) hypertension: Secondary | ICD-10-CM | POA: Diagnosis not present

## 2019-02-25 DIAGNOSIS — R413 Other amnesia: Secondary | ICD-10-CM | POA: Diagnosis not present

## 2019-02-25 DIAGNOSIS — E7800 Pure hypercholesterolemia, unspecified: Secondary | ICD-10-CM | POA: Diagnosis not present

## 2019-02-25 DIAGNOSIS — I739 Peripheral vascular disease, unspecified: Secondary | ICD-10-CM | POA: Diagnosis not present

## 2019-02-27 ENCOUNTER — Telehealth (INDEPENDENT_AMBULATORY_CARE_PROVIDER_SITE_OTHER): Payer: PPO | Admitting: Cardiology

## 2019-02-27 ENCOUNTER — Encounter: Payer: Self-pay | Admitting: Cardiology

## 2019-02-27 DIAGNOSIS — I739 Peripheral vascular disease, unspecified: Secondary | ICD-10-CM

## 2019-02-27 DIAGNOSIS — I10 Essential (primary) hypertension: Secondary | ICD-10-CM

## 2019-02-27 DIAGNOSIS — E785 Hyperlipidemia, unspecified: Secondary | ICD-10-CM

## 2019-02-27 DIAGNOSIS — I25118 Atherosclerotic heart disease of native coronary artery with other forms of angina pectoris: Secondary | ICD-10-CM

## 2019-02-27 DIAGNOSIS — E119 Type 2 diabetes mellitus without complications: Secondary | ICD-10-CM

## 2019-02-27 NOTE — Patient Instructions (Signed)
Medication Instructions:  Continue same medications If you need a refill on your cardiac medications before your next appointment, please call your pharmacy.   Lab work: None ordered   Testing/Procedures: None ordered  Follow-Up: At [facility], you and your health needs are our priority.  As part of our continuing mission to provide you with exceptional heart care, we have created designated Provider Care Teams.  These Care Teams include your primary Cardiologist (physician) and Advanced Practice Providers (APPs -  Physician Assistants and Nurse Practitioners) who all work together to provide you with the care you need, when you need it. . Schedule follow up appointment in 4 months  Call the end of July to schedule Oct appointment

## 2019-03-05 ENCOUNTER — Other Ambulatory Visit: Payer: Self-pay | Admitting: Cardiology

## 2019-03-09 ENCOUNTER — Other Ambulatory Visit: Payer: Self-pay | Admitting: *Deleted

## 2019-03-09 NOTE — Patient Outreach (Signed)
[facility]) Care Management  03/09/2019  Melinda Lewis 1918-09-08 [id number]  RN Health Coach is closing this program. Consumer is enrolled in [place] [facility] external program.  [place] Care Management [phone]

## 2019-03-11 ENCOUNTER — Telehealth: Payer: Self-pay | Admitting: Cardiology

## 2019-03-11 NOTE — Telephone Encounter (Signed)
Left message to call back  

## 2019-03-11 NOTE — Telephone Encounter (Signed)
With verbal permission, spoke to pt's sister who was calling concerning the notice on pt's AVS that mentioned her Synthroid dose was changed. Informed her that per office note on 6/12, Dr. Martinique didn't make any med changes but pt synthroid was only updated in her chart to reflect the current dose pt is taking. Pt's sister voiced understanding.

## 2019-03-11 NOTE — Telephone Encounter (Signed)
Melinda Lewis,Sister to the patient called. She claims she is the caregiver to the patient. However, she is not listed on the DPR.   [name] states the  patient received a letter in the mail from Dr. Martinique advising her to change how she takes Synthroid, and to review the updated medication list below.  The caregiver wanted to know why the dosage had changed following the appointment she had with him on 06/12. The caregiver states she would like some clarification.

## 2019-03-11 NOTE — Telephone Encounter (Signed)
Follow up   Patient's sister is returning your call. Please call.

## 2019-03-17 DIAGNOSIS — R600 Localized edema: Secondary | ICD-10-CM | POA: Diagnosis not present

## 2019-03-17 DIAGNOSIS — R0982 Postnasal drip: Secondary | ICD-10-CM | POA: Diagnosis not present

## 2019-03-17 DIAGNOSIS — I10 Essential (primary) hypertension: Secondary | ICD-10-CM | POA: Diagnosis not present

## 2019-03-21 ENCOUNTER — Encounter (HOSPITAL_COMMUNITY): Payer: Self-pay | Admitting: *Deleted

## 2019-03-21 ENCOUNTER — Inpatient Hospital Stay (HOSPITAL_COMMUNITY)
Admission: EM | Admit: 2019-03-21 | Discharge: 2019-03-27 | DRG: 069 | Disposition: A | Discharge status: Hospice | Payer: PPO | Attending: Family Medicine | Admitting: Family Medicine

## 2019-03-21 ENCOUNTER — Other Ambulatory Visit: Payer: Self-pay

## 2019-03-21 ENCOUNTER — Emergency Department (HOSPITAL_COMMUNITY): Payer: PPO

## 2019-03-21 DIAGNOSIS — D649 Anemia, unspecified: Secondary | ICD-10-CM | POA: Diagnosis present

## 2019-03-21 DIAGNOSIS — Z888 Allergy status to other drugs, medicaments and biological substances status: Secondary | ICD-10-CM

## 2019-03-21 DIAGNOSIS — Z880 Allergy status to penicillin: Secondary | ICD-10-CM

## 2019-03-21 DIAGNOSIS — I10 Essential (primary) hypertension: Secondary | ICD-10-CM | POA: Diagnosis not present

## 2019-03-21 DIAGNOSIS — Z87891 Personal history of nicotine dependence: Secondary | ICD-10-CM

## 2019-03-21 DIAGNOSIS — I700 Atherosclerosis of aorta: Secondary | ICD-10-CM | POA: Diagnosis present

## 2019-03-21 DIAGNOSIS — I6621 Occlusion and stenosis of right posterior cerebral artery: Secondary | ICD-10-CM | POA: Diagnosis present

## 2019-03-21 DIAGNOSIS — G9349 Other encephalopathy: Secondary | ICD-10-CM | POA: Diagnosis present

## 2019-03-21 DIAGNOSIS — E039 Hypothyroidism, unspecified: Secondary | ICD-10-CM | POA: Diagnosis present

## 2019-03-21 DIAGNOSIS — R4702 Dysphasia: Secondary | ICD-10-CM | POA: Diagnosis not present

## 2019-03-21 DIAGNOSIS — I672 Cerebral atherosclerosis: Secondary | ICD-10-CM | POA: Diagnosis present

## 2019-03-21 DIAGNOSIS — Z8673 Personal history of transient ischemic attack (TIA), and cerebral infarction without residual deficits: Secondary | ICD-10-CM

## 2019-03-21 DIAGNOSIS — R410 Disorientation, unspecified: Secondary | ICD-10-CM | POA: Diagnosis not present

## 2019-03-21 DIAGNOSIS — R0603 Acute respiratory distress: Secondary | ICD-10-CM | POA: Diagnosis not present

## 2019-03-21 DIAGNOSIS — R29810 Facial weakness: Secondary | ICD-10-CM | POA: Diagnosis present

## 2019-03-21 DIAGNOSIS — Z515 Encounter for palliative care: Secondary | ICD-10-CM | POA: Diagnosis not present

## 2019-03-21 DIAGNOSIS — I2510 Atherosclerotic heart disease of native coronary artery without angina pectoris: Secondary | ICD-10-CM | POA: Diagnosis present

## 2019-03-21 DIAGNOSIS — Z955 Presence of coronary angioplasty implant and graft: Secondary | ICD-10-CM

## 2019-03-21 DIAGNOSIS — R4182 Altered mental status, unspecified: Secondary | ICD-10-CM | POA: Diagnosis not present

## 2019-03-21 DIAGNOSIS — R4701 Aphasia: Secondary | ICD-10-CM | POA: Diagnosis not present

## 2019-03-21 DIAGNOSIS — G459 Transient cerebral ischemic attack, unspecified: Principal | ICD-10-CM | POA: Diagnosis present

## 2019-03-21 DIAGNOSIS — I82401 Acute embolism and thrombosis of unspecified deep veins of right lower extremity: Secondary | ICD-10-CM | POA: Diagnosis not present

## 2019-03-21 DIAGNOSIS — E854 Organ-limited amyloidosis: Secondary | ICD-10-CM | POA: Diagnosis present

## 2019-03-21 DIAGNOSIS — Z7984 Long term (current) use of oral hypoglycemic drugs: Secondary | ICD-10-CM

## 2019-03-21 DIAGNOSIS — Z20828 Contact with and (suspected) exposure to other viral communicable diseases: Secondary | ICD-10-CM | POA: Diagnosis present

## 2019-03-21 DIAGNOSIS — G40209 Localization-related (focal) (partial) symptomatic epilepsy and epileptic syndromes with complex partial seizures, not intractable, without status epilepticus: Secondary | ICD-10-CM | POA: Diagnosis present

## 2019-03-21 DIAGNOSIS — G4700 Insomnia, unspecified: Secondary | ICD-10-CM | POA: Diagnosis present

## 2019-03-21 DIAGNOSIS — Z7189 Other specified counseling: Secondary | ICD-10-CM

## 2019-03-21 DIAGNOSIS — I255 Ischemic cardiomyopathy: Secondary | ICD-10-CM | POA: Diagnosis present

## 2019-03-21 DIAGNOSIS — I6523 Occlusion and stenosis of bilateral carotid arteries: Secondary | ICD-10-CM | POA: Diagnosis not present

## 2019-03-21 DIAGNOSIS — I110 Hypertensive heart disease with heart failure: Secondary | ICD-10-CM | POA: Diagnosis present

## 2019-03-21 DIAGNOSIS — R251 Tremor, unspecified: Secondary | ICD-10-CM | POA: Diagnosis not present

## 2019-03-21 DIAGNOSIS — Z66 Do not resuscitate: Secondary | ICD-10-CM | POA: Diagnosis present

## 2019-03-21 DIAGNOSIS — E876 Hypokalemia: Secondary | ICD-10-CM | POA: Diagnosis not present

## 2019-03-21 DIAGNOSIS — F0390 Unspecified dementia without behavioral disturbance: Secondary | ICD-10-CM | POA: Diagnosis not present

## 2019-03-21 DIAGNOSIS — I252 Old myocardial infarction: Secondary | ICD-10-CM

## 2019-03-21 DIAGNOSIS — G9340 Encephalopathy, unspecified: Secondary | ICD-10-CM | POA: Diagnosis not present

## 2019-03-21 DIAGNOSIS — I6389 Other cerebral infarction: Secondary | ICD-10-CM | POA: Diagnosis not present

## 2019-03-21 DIAGNOSIS — I5022 Chronic systolic (congestive) heart failure: Secondary | ICD-10-CM | POA: Diagnosis present

## 2019-03-21 DIAGNOSIS — R0602 Shortness of breath: Secondary | ICD-10-CM | POA: Diagnosis not present

## 2019-03-21 DIAGNOSIS — E669 Obesity, unspecified: Secondary | ICD-10-CM | POA: Diagnosis present

## 2019-03-21 DIAGNOSIS — R918 Other nonspecific abnormal finding of lung field: Secondary | ICD-10-CM | POA: Diagnosis not present

## 2019-03-21 DIAGNOSIS — I680 Cerebral amyloid angiopathy: Secondary | ICD-10-CM | POA: Diagnosis present

## 2019-03-21 DIAGNOSIS — E785 Hyperlipidemia, unspecified: Secondary | ICD-10-CM | POA: Diagnosis present

## 2019-03-21 DIAGNOSIS — Z7982 Long term (current) use of aspirin: Secondary | ICD-10-CM

## 2019-03-21 DIAGNOSIS — Z09 Encounter for follow-up examination after completed treatment for conditions other than malignant neoplasm: Secondary | ICD-10-CM | POA: Diagnosis not present

## 2019-03-21 DIAGNOSIS — R413 Other amnesia: Secondary | ICD-10-CM | POA: Diagnosis not present

## 2019-03-21 DIAGNOSIS — I6782 Cerebral ischemia: Secondary | ICD-10-CM | POA: Diagnosis not present

## 2019-03-21 DIAGNOSIS — Z6830 Body mass index (BMI) 30.0-30.9, adult: Secondary | ICD-10-CM

## 2019-03-21 DIAGNOSIS — Z881 Allergy status to other antibiotic agents status: Secondary | ICD-10-CM

## 2019-03-21 DIAGNOSIS — G319 Degenerative disease of nervous system, unspecified: Secondary | ICD-10-CM | POA: Diagnosis not present

## 2019-03-21 DIAGNOSIS — R531 Weakness: Secondary | ICD-10-CM | POA: Diagnosis not present

## 2019-03-21 DIAGNOSIS — M7989 Other specified soft tissue disorders: Secondary | ICD-10-CM | POA: Diagnosis not present

## 2019-03-21 DIAGNOSIS — Z882 Allergy status to sulfonamides status: Secondary | ICD-10-CM

## 2019-03-21 DIAGNOSIS — Z79890 Hormone replacement therapy (postmenopausal): Secondary | ICD-10-CM

## 2019-03-21 DIAGNOSIS — R404 Transient alteration of awareness: Secondary | ICD-10-CM | POA: Diagnosis not present

## 2019-03-21 DIAGNOSIS — H9190 Unspecified hearing loss, unspecified ear: Secondary | ICD-10-CM | POA: Diagnosis present

## 2019-03-21 LAB — COMPREHENSIVE METABOLIC PANEL
ALT: 22 U/L (ref 0–44)
AST: 24 U/L (ref 15–41)
Albumin: 3.5 g/dL (ref 3.5–5.0)
Alkaline Phosphatase: 52 U/L (ref 38–126)
Anion gap: 13 (ref 5–15)
BUN: 18 mg/dL (ref 8–23)
CO2: 22 mmol/L (ref 22–32)
Calcium: 8.8 mg/dL — ABNORMAL LOW (ref 8.9–10.3)
Chloride: 101 mmol/L (ref 98–111)
Creatinine, Ser: 1.12 mg/dL — ABNORMAL HIGH (ref 0.44–1.00)
GFR calc Af Amer: 47 mL/min — ABNORMAL LOW (ref >60)
GFR calc non Af Amer: 41 mL/min — ABNORMAL LOW (ref >60)
Glucose, Bld: 121 mg/dL — ABNORMAL HIGH (ref 70–99)
Potassium: 4.4 mmol/L (ref 3.5–5.1)
Sodium: 136 mmol/L (ref 135–145)
Total Bilirubin: 0.6 mg/dL (ref 0.3–1.2)
Total Protein: 7 g/dL (ref 6.5–8.1)

## 2019-03-21 LAB — GLUCOSE, CAPILLARY
Glucose-Capillary: 88 mg/dL (ref 70–99)
Glucose-Capillary: 228 mg/dL — ABNORMAL HIGH (ref 70–99)

## 2019-03-21 LAB — DIFFERENTIAL
Abs Immature Granulocytes: 0.03 10*3/uL (ref 0.00–0.07)
Basophils Absolute: 0.1 10*3/uL (ref 0.0–0.1)
Basophils Relative: 1 %
Eosinophils Absolute: 0.2 10*3/uL (ref 0.0–0.5)
Eosinophils Relative: 2 %
Immature Granulocytes: 0 %
Lymphocytes Relative: 39 %
Lymphs Abs: 3.8 10*3/uL (ref 0.7–4.0)
Monocytes Absolute: 0.7 10*3/uL (ref 0.1–1.0)
Monocytes Relative: 7 %
Neutro Abs: 5.1 10*3/uL (ref 1.7–7.7)
Neutrophils Relative %: 51 %

## 2019-03-21 LAB — I-STAT CHEM 8, ED
BUN: 21 mg/dL (ref 8–23)
Calcium, Ion: 1.07 mmol/L — ABNORMAL LOW (ref 1.15–1.40)
Chloride: 100 mmol/L (ref 98–111)
Creatinine, Ser: 1.1 mg/dL — ABNORMAL HIGH (ref 0.44–1.00)
Glucose, Bld: 115 mg/dL — ABNORMAL HIGH (ref 70–99)
HCT: 33 % — ABNORMAL LOW (ref 36.0–46.0)
Hemoglobin: 11.2 g/dL — ABNORMAL LOW (ref 12.0–15.0)
Potassium: 4.4 mmol/L (ref 3.5–5.1)
Sodium: 136 mmol/L (ref 135–145)
TCO2: 27 mmol/L (ref 22–32)

## 2019-03-21 LAB — HEMOGLOBIN A1C
Hgb A1c MFr Bld: 7.4 % — ABNORMAL HIGH (ref 4.8–5.6)
Mean Plasma Glucose: 165.68 mg/dL

## 2019-03-21 LAB — CBC
HCT: 31.7 % — ABNORMAL LOW (ref 36.0–46.0)
Hemoglobin: 9.5 g/dL — ABNORMAL LOW (ref 12.0–15.0)
MCH: 23.9 pg — ABNORMAL LOW (ref 26.0–34.0)
MCHC: 30 g/dL (ref 30.0–36.0)
MCV: 79.6 fL — ABNORMAL LOW (ref 80.0–100.0)
Platelets: 323 10*3/uL (ref 150–400)
RBC: 3.98 MIL/uL (ref 3.87–5.11)
RDW: 16.3 % — ABNORMAL HIGH (ref 11.5–15.5)
WBC: 9.9 10*3/uL (ref 4.0–10.5)
nRBC: 0 % (ref 0.0–0.2)

## 2019-03-21 LAB — APTT: aPTT: 34 seconds (ref 24–36)

## 2019-03-21 LAB — CBG MONITORING, ED: Glucose-Capillary: 117 mg/dL — ABNORMAL HIGH (ref 70–99)

## 2019-03-21 LAB — SARS CORONAVIRUS 2 BY RT PCR (HOSPITAL ORDER, PERFORMED IN ~~LOC~~ HOSPITAL LAB): SARS Coronavirus 2: NEGATIVE

## 2019-03-21 LAB — PROTIME-INR
INR: 1 (ref 0.8–1.2)
Prothrombin Time: 13.4 seconds (ref 11.4–15.2)

## 2019-03-21 LAB — TSH: TSH: 0.445 u[IU]/mL (ref 0.350–4.500)

## 2019-03-21 MED ORDER — MELATONIN 3 MG PO TABS
6.0000 mg | ORAL_TABLET | Freq: Every day | ORAL | Status: DC
  Administered 2019-03-21 – 2019-03-23 (×3): 6 mg via ORAL
  Filled 2019-03-21 (×7): qty 2

## 2019-03-21 MED ORDER — HEPARIN SODIUM (PORCINE) 5000 UNIT/ML IJ SOLN
5000.0000 [IU] | Freq: Three times a day (TID) | INTRAMUSCULAR | Status: DC
  Administered 2019-03-21 – 2019-03-27 (×18): 5000 [IU] via SUBCUTANEOUS
  Filled 2019-03-21 (×17): qty 1

## 2019-03-21 MED ORDER — ISOSORBIDE MONONITRATE ER 30 MG PO TB24
30.0000 mg | ORAL_TABLET | Freq: Every day | ORAL | Status: DC
  Administered 2019-03-22 – 2019-03-27 (×6): 30 mg via ORAL
  Filled 2019-03-21 (×6): qty 1

## 2019-03-21 MED ORDER — LEVOTHYROXINE SODIUM 75 MCG PO TABS
150.0000 ug | ORAL_TABLET | Freq: Every day | ORAL | Status: DC
  Administered 2019-03-22 – 2019-03-24 (×3): 150 ug via ORAL
  Filled 2019-03-21 (×4): qty 2

## 2019-03-21 MED ORDER — SODIUM CHLORIDE 0.9 % IV SOLN
INTRAVENOUS | Status: DC
  Administered 2019-03-21 – 2019-03-26 (×7): via INTRAVENOUS

## 2019-03-21 MED ORDER — MELATONIN 5 MG PO TABS
5.0000 mg | ORAL_TABLET | Freq: Every day | ORAL | Status: DC
  Filled 2019-03-21: qty 1

## 2019-03-21 MED ORDER — ASPIRIN EC 81 MG PO TBEC
81.0000 mg | DELAYED_RELEASE_TABLET | Freq: Every day | ORAL | Status: DC
  Administered 2019-03-22 – 2019-03-25 (×4): 81 mg via ORAL
  Filled 2019-03-21 (×4): qty 1

## 2019-03-21 MED ORDER — ACETAMINOPHEN 160 MG/5ML PO SOLN: 650.0000 mg | ORAL | Status: DC | PRN

## 2019-03-21 MED ORDER — STROKE: EARLY STAGES OF RECOVERY BOOK
Freq: Once | Status: AC
  Administered 2019-03-21: 19:00:00
  Filled 2019-03-21: qty 1

## 2019-03-21 MED ORDER — GADOBUTROL 1 MMOL/ML IV SOLN
7.5000 mL | Freq: Once | INTRAVENOUS | Status: AC | PRN
  Administered 2019-03-21: 17:00:00 7.5 mL via INTRAVENOUS

## 2019-03-21 MED ORDER — METOPROLOL SUCCINATE ER 25 MG PO TB24
25.0000 mg | ORAL_TABLET | Freq: Every day | ORAL | Status: DC
  Administered 2019-03-22 – 2019-03-25 (×4): 25 mg via ORAL
  Filled 2019-03-21 (×4): qty 1

## 2019-03-21 MED ORDER — PANTOPRAZOLE SODIUM 40 MG PO TBEC
40.0000 mg | DELAYED_RELEASE_TABLET | Freq: Every day | ORAL | Status: DC
  Administered 2019-03-21 – 2019-03-23 (×3): 40 mg via ORAL
  Filled 2019-03-21 (×4): qty 1

## 2019-03-21 MED ORDER — LABETALOL HCL 5 MG/ML IV SOLN
5.0000 mg | INTRAVENOUS | Status: DC | PRN
  Administered 2019-03-21: 21:00:00 5 mg via INTRAVENOUS
  Filled 2019-03-21: qty 4

## 2019-03-21 MED ORDER — SENNOSIDES-DOCUSATE SODIUM 8.6-50 MG PO TABS: 1.0000 | ORAL_TABLET | Freq: Every evening | ORAL | Status: DC | PRN

## 2019-03-21 MED ORDER — ATORVASTATIN CALCIUM 80 MG PO TABS
80.0000 mg | ORAL_TABLET | Freq: Every day | ORAL | Status: DC
  Administered 2019-03-21 – 2019-03-23 (×3): 80 mg via ORAL
  Filled 2019-03-21 (×5): qty 1

## 2019-03-21 MED ORDER — NITROGLYCERIN 0.4 MG SL SUBL: 0.4000 mg | SUBLINGUAL_TABLET | SUBLINGUAL | Status: DC | PRN

## 2019-03-21 MED ORDER — INSULIN ASPART 100 UNIT/ML ~~LOC~~ SOLN
0.0000 [IU] | Freq: Three times a day (TID) | SUBCUTANEOUS | Status: DC
  Administered 2019-03-22: 17:00:00 1 [IU] via SUBCUTANEOUS
  Administered 2019-03-22 – 2019-03-23 (×3): 2 [IU] via SUBCUTANEOUS
  Administered 2019-03-23 – 2019-03-25 (×5): 1 [IU] via SUBCUTANEOUS
  Administered 2019-03-25: 3 [IU] via SUBCUTANEOUS
  Administered 2019-03-26: 1 [IU] via SUBCUTANEOUS
  Administered 2019-03-27: 14:00:00 2 [IU] via SUBCUTANEOUS

## 2019-03-21 MED ORDER — ACETAMINOPHEN 325 MG PO TABS: 650.0000 mg | ORAL_TABLET | ORAL | Status: DC | PRN

## 2019-03-21 MED ORDER — ASPIRIN EC 325 MG PO TBEC
650.0000 mg | DELAYED_RELEASE_TABLET | Freq: Once | ORAL | Status: AC
  Administered 2019-03-21: 17:00:00 650 mg via ORAL
  Filled 2019-03-21: qty 2

## 2019-03-21 MED ORDER — ACETAMINOPHEN 650 MG RE SUPP
650.0000 mg | RECTAL | Status: DC | PRN
  Administered 2019-03-26: 05:00:00 650 mg via RECTAL

## 2019-03-21 MED ORDER — SODIUM CHLORIDE 0.9% FLUSH
3.0000 mL | Freq: Once | INTRAVENOUS | Status: AC
Start: 2019-03-21 — End: 2019-03-21
  Administered 2019-03-21: 18:00:00 3 mL via INTRAVENOUS

## 2019-03-21 NOTE — ED Notes (Signed)
Pt remains in MRI at this time  

## 2019-03-21 NOTE — ED Notes (Signed)
Admitting MD in to assess pt for admission 

## 2019-03-21 NOTE — Consult Note (Signed)
NEURO HOSPITALIST CONSULT NOTE   Requestig physician: Dr. Madilyn Hookees  Reason for Consult: Sudden onset of facial droop and AMS  History obtained from:  EMS and Chart   HPI:                                                                                                                                          Melinda Lewis is an 85 y.o. female presenting via EMS as a Code Stroke. She had an initial spell observed by family at [id number] which resolved. A second spell of AMS and left sided facial droop occurred at 1245 today. EMS was called. On EMS arrival, she was noted to be unable to recognize family members and had difficulty following commands. Her BP was 248/98 in the field. Her PMHx includes dementia and TIA.   On arrival to the ED, her exam was nonfocal but she was mildly confused. CT head was negative for acute abnormality. MRI and MRA were then ordered. MRI showed atrophy, chronic small vessel ischemic changes and a faint punctate artifactual hyperintensity in the left temporal lobe on the DWI sequence. MRA revealed moderate stenosis of the bilateral ICA siphons and moderate to severe stenosis of the right PCA P2 segment.  Home medications include ASA and atorvastatin.   Stroke risk factors include CHF, CAD, DM, HTN, TIA and HLD.   tPA given: No, nonfocal neurological exam LKN: 12:45 PM  Past Medical History:  Diagnosis Date   Acute on chronic systolic CHF (congestive heart failure) (HCC) 06/16/2014   Anemia    CAD (coronary artery disease) 8/15   s/p DES LCX and LAD    Cardiomyopathy, ischemic 06/16/2014   Diabetes mellitus without complication (HCC)    Hypertension    NSTEMI (non-ST elevated myocardial infarction) (HCC)    Thyroid disease    TIA (transient ischemic attack) 2010    Past Surgical History:  Procedure Laterality Date   LEFT HEART CATHETERIZATION WITH CORONARY ANGIOGRAM N/A 05/03/2014   Procedure: LEFT HEART CATHETERIZATION WITH CORONARY  ANGIOGRAM;  Surgeon: Peter M SwazilandJordan, MD;  [facility];  Service: Cardiovascular;  Laterality: N/A;   PERCUTANEOUS CORONARY STENT INTERVENTION (PCI-S) Right 05/03/2014   Procedure: PERCUTANEOUS CORONARY STENT INTERVENTION (PCI-S);  Surgeon: Peter M SwazilandJordan, MD;  [facility];  Service: Cardiovascular;  Laterality: Right;   PERCUTANEOUS CORONARY STENT INTERVENTION (PCI-S) N/A 05/04/2014   Procedure: PERCUTANEOUS CORONARY STENT INTERVENTION (PCI-S);  Surgeon: Peter M SwazilandJordan, MD;  [facility] LAB;  Service: Cardiovascular;  Laterality: N/A;    No family history on file.            Social History:  reports that she has quit smoking. Her smoking use included cigarettes. She has never used smokeless tobacco. She reports that  she does not drink alcohol or use drugs.  Allergies  Allergen Reactions   Cefuroxime Axetil Nausea Only    cramps   Clonidine Derivatives Other (See Comments)    Maker her pass out   Epinephrine    Penicillins    Septra [Sulfamethoxazole-Trimethoprim] Nausea And Vomiting    HOME MEDICATIONS:                                                                                                                        ROS:                                                                                                                                       As per HPI. The patient is unable to provide a detailed history in the context of her dementia.    Blood pressure (!) 183/63, pulse 81, resp. rate 14, height 5\' 2"  (1.575 m), weight 75.8 kg, SpO2 94 %.   General Examination:                                                                                                       Physical Exam  HEENT-  [place]/AT   Lungs-Respirations unlabored Extremities- Warm and well perfused  Neurological Examination Mental Status: Pleasant and cooperative. Confused. Able to name objects and follow basic commands. Speech is fluent. Impaired repetition. No  hemineglect.  Cranial Nerves: II: Visual fields intact. No extinction to DSS.  III,IV, VI: EOMI. No ptosis.  V,VII: Face symmetric. Facial temp equal bilaterally VIII: HOH IX,X: Phonation intact XI: Symmetric XII: midline tongue extension Motor: Right : Upper extremity   5/5    Left:     Upper extremity   5/5  Lower extremity   5/5     Lower extremity   5/5 No pronator drift. Tone and bulk are normal for age.  Sensory: Temp and light touch intact x 4. No extinction to DSS.  Deep Tendon Reflexes: No asymmetry Cerebellar:  No gross ataxia on limited exam.  Gait: Deferred   Lab Results: Basic Metabolic Panel: Recent Labs  Lab 03/21/19 1333 03/21/19 1339  NA 136 136  K 4.4 4.4  CL 101 100  CO2 22  --   GLUCOSE 121* 115*  BUN 18 21  CREATININE 1.12* 1.10*  CALCIUM 8.8*  --     CBC: Recent Labs  Lab 03/21/19 1333 03/21/19 1339  WBC 9.9  --   NEUTROABS 5.1  --   HGB 9.5* 11.2*  HCT 31.7* 33.0*  MCV 79.6*  --   PLT 323  --     Cardiac Enzymes: No results for input(s): CKTOTAL, CKMB, CKMBINDEX, TROPONINI in the last 168 hours.  Lipid Panel: No results for input(s): CHOL, TRIG, HDL, CHOLHDL, VLDL, LDLCALC in the last 168 hours.  Imaging: Ct Head Code Stroke Wo Contrast  Result Date: 03/21/2019 CLINICAL DATA:  Code stroke.  Aphasia and memory loss. EXAM: CT HEAD WITHOUT CONTRAST TECHNIQUE: Contiguous axial images were obtained from the base of the skull through the vertex without intravenous contrast. COMPARISON:  08/13/2018 MRI.  09/08/2017 CT FINDINGS: Brain: No sign of acute infarction. Age related atrophy. Chronic small-vessel ischemic changes of the white matter. No mass lesion, hemorrhage, hydrocephalus or extra-axial collection. Vascular: There is atherosclerotic calcification of the major vessels at the base of the brain. Skull: Negative Sinuses/Orbits: Clear/normal Other: None ASPECTS (Alberta Stroke Program Early CT Score) - Ganglionic level infarction (caudate,  lentiform nuclei, internal capsule, insula, M1-M3 cortex): 7 - Supraganglionic infarction (M4-M6 cortex): 3 Total score (0-10 with 10 being normal): 10 IMPRESSION: 1. No acute finding. Atrophy and chronic small-vessel ischemic changes. 2. ASPECTS is 10. 3. These results were communicated to Dr. Otelia Limes at 1:56 pmon 7/4/2020by text page via the [facility] messaging system. Electronically Signed   By: Paulina Fusi M.D.   On: 03/21/2019 13:57   MRI brain, MRA head: 1. Questionable punctate white matter infarct in the left temporal stem (series 11, image 46), although I favor artifact instead. 2. Otherwise no acute intracranial abnormality and stable MRI appearance of the brain since 2019. 3. Intracranial MRA is positive for intracranial atherosclerosis with - moderate stenosis of the bilateral ICA siphons. - moderate to severe stenosis of the right PCA P2 segment.  Assessment: 85 year old female with TIA 1. Exam is nonlateralizing. Confusion is consistent with her history of dementia.  2. No acute stroke seen on MRI brain. 3. MRA head reveals intracranial atherosclerotic narrowing  Recommendations: 1. ASA 650 mg x 1 now, crushed 2. Continue ASA 81 mg po qam 3. PT/OT/Speech 4. Full stroke work up to include TTE, telemetry and carotid ultrasound 5. Modified permissive HTN protocol given advanced age. Correct if SBP > 180    Electronically signed: Dr. Caryl Pina 03/21/2019, 2:38 PM

## 2019-03-21 NOTE — ED Notes (Signed)
Pt to MRI at this time.

## 2019-03-21 NOTE — ED Notes (Signed)
Daughter notified at 1430 -- updated  on pt's status

## 2019-03-21 NOTE — H&P (Signed)
[facility] History and Physical Service Pager: [phone]  Patient name: Melinda Lewis Medical record number: [id number] Date of birth: 03-01-1918 Age: 85 y.o. Gender: female  Primary Care Provider: Marguarite ArbourSparks, Jeffrey D, MD Consultants: Neuro Code Status: DNR  Chief Complaint: TIA  Assessment and Plan: Melinda Lewis is a 85 y.o. female presenting with tia . PMH is significant for history of TIA, dementia, hyperlipidemia, hypertension, history of NSTEMI, CAD, HFpEF 60-65%, SMA stenosis, aortic atherosclerosis, type 2 diabetes  TIA with history of prior TIAs: Patient presents with story from home TIA starting this morning at approximately 11:45 AM.  Family said the first 1 resolved on its own within a few minutes and then facial droop and confused speech started happening again.  So they called EMS, on MS arrival patient was having trouble following commands and was not recognize her family.  She has mild baseline dementia but is generally very interactive and able to do a lot of her ADLs.  This is been a known problem for years.  Upon arrival to the ED and to admission exam, patient had no focal deficits and while mildly confused about how she ended up at the hospital was AOx3.  ED physician consulted neurology who requested TIA admission.  CT head was negative for acute bleed.  MRI and MRA showed question of artifact versus new infarct, also vascular stenosis.  650 aspirin ordered.  Patient is not on anticoagulation.  Patient passed nurse bedside swallow.  Was on home atorvastatin 40 -Admit for observation, attending Dr. McDiarmid -Echo ordered -Permissive hypertension to systolic of 220 unless directed otherwise by neurology -We will continue with 81 mg aspirin per day after loading dose -Increase atorvastatin to 80  Hypertension: At home takes Imdur, metoprolol,, lisinopril, hydralazine as needed.  Did present with hypertension to the 180s  systolic -We will plan for permissive hypertension to systolic of 220 (with PRN labetalol) for 3 to 5 days with slow resolution afterwards unless directed otherwise by neurology. -We will continue metoprolol and Imdur out of concern for rebound angina  Hypothyroidism-on Synthroid 150 -We will check TSH and continue home Synthroid  Insomnia-Home trazodone and melatonin -Continue home meds  PRN Tylenol for pain  FEN/GI: Regular diet, Protonix Prophylaxis: Subcu heparin  Disposition: Observation for TIA work-up and then likely home  History of Present Illness:  Melinda Lewis is a 85 y.o. female presenting with witnessed TIA.  Per sister (live in caregiver):  Unsure which medicine but patient has started a new BP medicine 3 days ago.  Has had sbp >170 on 6/30, 7/1.  sbp 130 on 7/2.   Patient woke up at this morning with a sbp of ~180 and pulse of 90s.  Later that morning, patient started having trouble talking, @11 :45, Bp was 145/56 with hr 65, O2 93.  Speech changes resolved in a few minutes, then started again so she called EMS.  When EMS got there, she couldn't identify family and wasn't talking much and was having trouble following commands.  Per Daughter(healthcare POA): Was not with patient at time of event but confirms DNR status.  Review Of Systems: Per HPI with the following additions:   Review of Systems  Constitutional: Negative for chills and fever.  HENT: Positive for hearing loss. Negative for congestion, ear pain and sore throat.   Eyes: Negative.   Respiratory: Negative.  Negative for stridor.   Cardiovascular: Negative.   Gastrointestinal: Negative for abdominal pain, constipation, nausea and  vomiting.  Genitourinary: Negative.   Musculoskeletal: Negative.   Skin: Negative for rash.  Neurological: Negative for dizziness, sensory change, focal weakness, loss of consciousness, weakness and headaches.  Psychiatric/Behavioral: Positive for memory loss. Negative for  depression and substance abuse. The patient has insomnia. The patient is not nervous/anxious.     Patient Active Problem List   Diagnosis Date Noted  . TIA (transient ischemic attack) 03/21/2019  . AKI (acute kidney injury) ([place]) 09/05/2018  . Diabetes mellitus without complication ([place]) 38/45/3646  . SMA stenosis ([place]) 10/22/2017  . Aortic atherosclerosis ([place]) 10/22/2017  . Carotid disease, bilateral ([place]) 10/22/2017  . Uncontrolled hypertension 09/09/2017  . Nausea & vomiting 09/08/2017  . Chronic systolic CHF (congestive heart failure) ([place]) 03/04/2015  . Acute on chronic systolic CHF (congestive heart failure) ([place]) 06/16/2014  . Cardiomyopathy, ischemic 06/16/2014  . CAD, multiple vessel 06/15/2014  . Anemia 05/05/2014  . Hypothyroidism 05/05/2014  . 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/05/2014  . NSTEMI (non-ST elevated myocardial infarction) ([place]) 05/02/2014  . Hypertension 05/02/2014  . Pulmonary edema cardiac cause ([place]) 05/02/2014    Past Medical History: Past Medical History:  Diagnosis Date  . Acute on chronic systolic CHF (congestive heart failure) ([place]) 06/16/2014  . Anemia   . CAD (coronary artery disease) 8/15   s/p DES LCX and LAD   . Cardiomyopathy, ischemic 06/16/2014  . Diabetes mellitus without complication ([place])   . Hypertension   . NSTEMI (non-ST elevated myocardial infarction) ([place])   . Thyroid disease   . TIA (transient ischemic attack) 2010    Past Surgical History: Past Surgical History:  Procedure Laterality Date  . LEFT HEART CATHETERIZATION WITH CORONARY ANGIOGRAM N/A 05/03/2014   Procedure: LEFT HEART CATHETERIZATION WITH CORONARY ANGIOGRAM;  Surgeon: Peter M Martinique, MD;  Location: [facility] CATH LAB;  Service: Cardiovascular;  Laterality: N/A;  . PERCUTANEOUS CORONARY STENT INTERVENTION (PCI-S) Right 05/03/2014   Procedure: PERCUTANEOUS CORONARY STENT INTERVENTION (PCI-S);  Surgeon: Peter M Martinique, MD;  Location: [facility] CATH LAB;  Service:  Cardiovascular;  Laterality: Right;  . PERCUTANEOUS CORONARY STENT INTERVENTION (PCI-S) N/A 05/04/2014   Procedure: PERCUTANEOUS CORONARY STENT INTERVENTION (PCI-S);  Surgeon: Peter M Martinique, MD;  Location: [facility] CATH LAB;  Service: Cardiovascular;  Laterality: N/A;    Social History: Social History   Tobacco Use  . Smoking status: Former Smoker    Types: Cigarettes  . Smokeless tobacco: Never Used  Substance Use Topics  . Alcohol use: No  . Drug use: No   Additional social history: Lives with sister Please also refer to relevant sections of EMR.  Family History: No family history on file.   Allergies and Medications: Allergies  Allergen Reactions  . Cefuroxime Axetil Nausea Only    cramps  . Clonidine Derivatives Other (See Comments)    Maker her pass out  . Epinephrine   . Penicillins   . Septra [Sulfamethoxazole-Trimethoprim] Nausea And Vomiting   No current facility-administered medications on file prior to encounter.    Current Outpatient Medications on File Prior to Encounter  Medication Sig Dispense Refill  . aspirin EC 81 MG tablet Take 81 mg by mouth daily.    Marland Kitchen atorvastatin (LIPITOR) 40 MG tablet TAKE 1 TABLET BY MOUTH DAILY AT 6PM 90 tablet 2  . Flaxseed, Linseed, (FLAX SEED OIL PO) Take 1 tablet by mouth daily.    . hydrALAZINE (APRESOLINE) 50 MG tablet Take 1 tablet (50 mg total) by mouth daily as needed (For Systolic OE>321). 30 tablet 0  .  isosorbide mononitrate (IMDUR) 30 MG 24 hr tablet Take 30 mg by mouth daily.    Marland Kitchen. L-Arginine 1000 MG TABS Take 1 tablet by mouth 3 (three) times daily.    Marland Kitchen. levothyroxine (SYNTHROID) 150 MCG tablet Take 1 tablet by mouth daily.    Marland Kitchen. lisinopril (PRINIVIL,ZESTRIL) 40 MG tablet Take 1 tablet (40 mg total) by mouth daily. 30 tablet 0  . LORazepam (ATIVAN) 0.5 MG tablet Take 1 tablet (0.5 mg total) by mouth every 8 (eight) hours as needed for anxiety. 20 tablet 0  . magnesium oxide (MAG-OX) 400 MG tablet Take 400 mg by mouth  daily.     . Melatonin 5 MG TABS Take 1 tablet by mouth at bedtime.    . metFORMIN (GLUCOPHAGE) 500 MG tablet Take 500 mg by mouth daily.     . metoprolol succinate (TOPROL-XL) 25 MG 24 hr tablet TAKE ONE TABLET BY MOUTH EVERY DAY 90 tablet 3  . nitroGLYCERIN (NITROSTAT) 0.4 MG SL tablet Place 1 tablet (0.4 mg total) under the tongue every 5 (five) minutes as needed for chest pain. 90 tablet 3  . ondansetron (ZOFRAN ODT) 4 MG disintegrating tablet Take 1 tablet (4 mg total) by mouth every 8 (eight) hours as needed for nausea or vomiting. 20 tablet 0  . pantoprazole (PROTONIX) 40 MG tablet Take 40 mg by mouth daily.    . QUEtiapine (SEROQUEL) 25 MG tablet Take 1 tablet by mouth at bedtime.      Objective: BP (!) 207/68   Pulse 85   Resp 18   Ht 5\' 2"  (1.575 m)   Wt 75.8 kg   SpO2 95%   BMI 30.56 kg/m  Exam: General: Pleasant, tired appearing, no acute distress Eyes: EOMI, PERRLA Neck: Soft, range of motion intact Cardiovascular: Regular rate, no murmur noted Respiratory: Clear to auscultation bilaterally, no cough, no stridor, no increased work of breathing Gastrointestinal: Soft, obese, no tenderness to palpation MSK:, Motor control intact with no gross deficits Derm: No concerning lesions to exposed skin Neuro: Motor control intact with no focal deficits identified, did have some memory Of the TIA itself and some mild dementia evident Psych: Pleasant ,AO x3  Labs and Imaging: CBC BMET  Recent Labs  Lab 03/21/19 1333 03/21/19 1339  WBC 9.9  --   HGB 9.5* 11.2*  HCT 31.7* 33.0*  PLT 323  --    Recent Labs  Lab 03/21/19 1333 03/21/19 1339  NA 136 136  K 4.4 4.4  CL 101 100  CO2 22  --   BUN 18 21  CREATININE 1.12* 1.10*  GLUCOSE 121* 115*  CALCIUM 8.8*  --      Dg Chest Port 1 View  Result Date: 03/21/2019 CLINICAL DATA:  Altered mental status EXAM: PORTABLE CHEST 1 VIEW COMPARISON:  10/05/2017 chest radiograph. FINDINGS: Stable cardiomediastinal silhouette with  normal heart size. No pneumothorax. No pleural effusion. Stable calcified peripheral lower left lung granuloma. No pulmonary edema. No acute consolidative airspace disease. IMPRESSION: No active disease. Electronically Signed   By: Delbert PhenixJason A Poff M.D.   On: 03/21/2019 15:09   Ct Head Code Stroke Wo Contrast  Result Date: 03/21/2019 CLINICAL DATA:  Code stroke.  Aphasia and memory loss. EXAM: CT HEAD WITHOUT CONTRAST TECHNIQUE: Contiguous axial images were obtained from the base of the skull through the vertex without intravenous contrast. COMPARISON:  08/13/2018 MRI.  09/08/2017 CT FINDINGS: Brain: No sign of acute infarction. Age related atrophy. Chronic small-vessel ischemic changes of the white  matter. No mass lesion, hemorrhage, hydrocephalus or extra-axial collection. Vascular: There is atherosclerotic calcification of the major vessels at the base of the brain. Skull: Negative Sinuses/Orbits: Clear/normal Other: None ASPECTS ([place] Stroke Program Early CT Score) - Ganglionic level infarction (caudate, lentiform nuclei, internal capsule, insula, M1-M3 cortex): 7 - Supraganglionic infarction (M4-M6 cortex): 3 Total score (0-10 with 10 being normal): 10 IMPRESSION: 1. No acute finding. Atrophy and chronic small-vessel ischemic changes. 2. ASPECTS is 10. 3. These results were communicated to Dr. Otelia Limes at 1:56 pmon 7/4/2020by text page via the [facility] messaging system. Electronically Signed   By: Paulina Fusi M.D.   On: 03/21/2019 13:57    Marthenia Rolling, DO 03/21/2019, 4:59 PM PGY-3, [facility] FPTS Intern pager: [phone], text pages welcome

## 2019-03-21 NOTE — ED Provider Notes (Signed)
[facility] Provider Note   CSN: [id number] Arrival date & time: 03/21/19  1331  An emergency department physician performed an initial assessment on this suspected stroke patient at 1332.  History   Chief Complaint Chief Complaint  Patient presents with   Code Stroke    HPI Melinda Lewis is a 85 y.o. female.     The history is provided by the patient, the EMS personnel and medical records. No language interpreter was used.   Melinda Lewis is a 85 y.o. female who presents to the Emergency Department complaining of code stroke. Level V caveat due to confusion. Per EMS she was found by family at 1245 to be altered with left-sided facial droop. She lives at home alone. No recent illnesses. Patient has no complaints on ED arrival. Past Medical History:  Diagnosis Date   Acute on chronic systolic CHF (congestive heart failure) (HCC) 06/16/2014   Anemia    CAD (coronary artery disease) 8/15   s/p DES LCX and LAD    Cardiomyopathy, ischemic 06/16/2014   Diabetes mellitus without complication (HCC)    Hypertension    NSTEMI (non-ST elevated myocardial infarction) [facility](HCC)    Thyroid disease    TIA (transient ischemic attack) 2010    Patient Active Problem List   Diagnosis Date Noted   AKI (acute kidney injury) (HCC) 09/05/2018   Diabetes mellitus without complication (HCC) 10/22/2017   SMA stenosis (HCC) 10/22/2017   Aortic atherosclerosis (HCC) 10/22/2017   Carotid disease, bilateral (HCC) 10/22/2017   Uncontrolled hypertension 09/09/2017   Nausea & vomiting 09/08/2017   Chronic systolic CHF (congestive heart failure) (HCC) 03/04/2015   Acute on chronic systolic CHF (congestive heart failure) (HCC) 06/16/2014   Cardiomyopathy, ischemic 06/16/2014   CAD, multiple vessel 06/15/2014   Anemia 05/05/2014   Hypothyroidism 05/05/2014   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/05/2014   NSTEMI (non-ST elevated  myocardial infarction) (HCC) 05/02/2014   Hypertension 05/02/2014   Pulmonary edema cardiac cause (HCC) 05/02/2014    Past Surgical History:  Procedure Laterality Date   LEFT HEART CATHETERIZATION WITH CORONARY ANGIOGRAM N/A 05/03/2014   Procedure: LEFT HEART CATHETERIZATION WITH CORONARY ANGIOGRAM;  Surgeon: Peter M SwazilandJordan, MD;  [facility];  Service: Cardiovascular;  Laterality: N/A;   PERCUTANEOUS CORONARY STENT INTERVENTION (PCI-S) Right 05/03/2014   Procedure: PERCUTANEOUS CORONARY STENT INTERVENTION (PCI-S);  Surgeon: Peter M SwazilandJordan, MD;  [facility];  Service: Cardiovascular;  Laterality: Right;   PERCUTANEOUS CORONARY STENT INTERVENTION (PCI-S) N/A 05/04/2014   Procedure: PERCUTANEOUS CORONARY STENT INTERVENTION (PCI-S);  Surgeon: Peter M SwazilandJordan, MD;  [facility];  Service: Cardiovascular;  Laterality: N/A;     OB History   No obstetric history on file.      Home Medications    Prior to Admission medications   Medication Sig Start Date End Date Taking? Authorizing Provider  aspirin EC 81 MG tablet Take 81 mg by mouth daily.    [provider]  atorvastatin (LIPITOR) 40 MG tablet TAKE 1 TABLET BY MOUTH DAILY AT [facility]6PM 03/05/19   SwazilandJordan, Peter M, MD  cefdinir (OMNICEF) 300 MG capsule Take 1 capsule (300 mg total) by mouth every 12 (twelve) hours. 09/07/18   Gouru, Deanna ArtisAruna, MD  Flaxseed, Linseed, (FLAX SEED OIL PO) Take 1 tablet by mouth daily.    [provider]  hydrALAZINE (APRESOLINE) 50 MG tablet Take 1 tablet (50 mg total) by mouth daily as needed (For Systolic BP>200).  09/12/17 11/21/18  Milagros Loll, MD  isosorbide mononitrate (IMDUR) 30 MG 24 hr tablet Take 30 mg by mouth daily. 11/20/18   [provider]  L-Arginine 1000 MG TABS Take 1 tablet by mouth 3 (three) times daily.    [provider]  levothyroxine (SYNTHROID) 150 MCG tablet Take 1 tablet by mouth daily. 12/08/18   [provider]  lisinopril  (PRINIVIL,ZESTRIL) 40 MG tablet Take 1 tablet (40 mg total) by mouth daily. 09/13/17   Milagros Loll, MD  LORazepam (ATIVAN) 0.5 MG tablet Take 1 tablet (0.5 mg total) by mouth every 8 (eight) hours as needed for anxiety. 11/04/18 11/04/19  Emily Filbert, MD  magnesium oxide (MAG-OX) 400 MG tablet Take 400 mg by mouth daily.     [provider]  Melatonin 5 MG TABS Take 1 tablet by mouth at bedtime.    [provider]  metFORMIN (GLUCOPHAGE) 500 MG tablet Take 500 mg by mouth daily.     [provider]  metoprolol succinate (TOPROL-XL) 25 MG 24 hr tablet TAKE ONE TABLET BY MOUTH EVERY DAY 12/09/18   Azalee Course, PA  nitroGLYCERIN (NITROSTAT) 0.4 MG SL tablet Place 1 tablet (0.4 mg total) under the tongue every 5 (five) minutes as needed for chest pain. 11/25/17 11/21/18  [place]azil[place], Peter M, MD  ondansetron (ZOFRAN ODT) 4 MG disintegrating tablet Take 1 tablet (4 mg total) by mouth every 8 (eight) hours as needed for nausea or vomiting. 09/19/18   Emily Filbert, MD  pantoprazole (PROTONIX) 40 MG tablet Take 40 mg by mouth daily.    [provider]  QUEtiapine (SEROQUEL) 25 MG tablet Take 1 tablet by mouth at bedtime. 12/17/16   [provider]    Family History No family history on file.  Social History Social History   Tobacco Use   Smoking status: Former Smoker    Types: Cigarettes   Smokeless tobacco: Never Used  Substance Use Topics   Alcohol use: No   Drug use: No     Allergies   Cefuroxime axetil, Clonidine derivatives, Epinephrine, Penicillins, and Septra [sulfamethoxazole-trimethoprim]   Review of Systems Review of Systems  All other systems reviewed and are negative.    Physical Exam Updated Vital Signs BP (!) 183/63    Pulse 81    Resp 14    Ht 5\' 2"  (1.575 m)    Wt 75.8 kg    SpO2 94%    BMI 30.56 kg/m   Physical Exam Vitals signs and nursing note reviewed.  Constitutional:      Appearance: She is well-developed.   HENT:     Head: Normocephalic and atraumatic.  Cardiovascular:     Rate and Rhythm: Normal rate and regular rhythm.     Heart sounds: No murmur.  Pulmonary:     Effort: Pulmonary effort is normal. No respiratory distress.     Breath sounds: Normal breath sounds.  Abdominal:     Palpations: Abdomen is soft.     Tenderness: There is no abdominal tenderness. There is no guarding or rebound.  Musculoskeletal:        General: No tenderness.  Skin:    General: Skin is warm and dry.  Neurological:     Mental Status: She is alert.     Comments: No asymmetry of facial muscles. Mildly confused. Disoriented to recent events. Five out of five strength in all four extremities with sensation to light touch intact in all four extremities  Psychiatric:  Behavior: Behavior normal.      ED Treatments / Results  Labs (all labs ordered are listed, but only abnormal results are displayed) Labs Reviewed  CBC - Abnormal; Notable for the following components:      Result Value   Hemoglobin 9.5 (*)    HCT 31.7 (*)    MCV 79.6 (*)    MCH 23.9 (*)    RDW 16.3 (*)    All other components within normal limits  COMPREHENSIVE METABOLIC PANEL - Abnormal; Notable for the following components:   Glucose, Bld 121 (*)    Creatinine, Ser 1.12 (*)    Calcium 8.8 (*)    GFR calc non Af Amer 41 (*)    GFR calc Af Amer 47 (*)    All other components within normal limits  I-STAT CHEM 8, ED - Abnormal; Notable for the following components:   Creatinine, Ser 1.10 (*)    Glucose, Bld 115 (*)    Calcium, Ion 1.07 (*)    Hemoglobin 11.2 (*)    HCT 33.0 (*)    All other components within normal limits  CBG MONITORING, ED - Abnormal; Notable for the following components:   Glucose-Capillary 117 (*)    All other components within normal limits  SARS CORONAVIRUS 2 (HOSPITAL ORDER, [place] LAB)  PROTIME-INR  APTT  DIFFERENTIAL    EKG EKG  Interpretation  Date/Time:  Saturday March 21 2019 14:02:13 EDT Ventricular Rate:  87 PR Interval:    QRS Duration: 85 QT Interval:  401 QTC Calculation: 483 R Axis:   16 Text Interpretation:  Sinus rhythm Probable left atrial enlargement Confirmed by Quintella Reichert [phone]) on 03/21/2019 2:10:50 PM   Radiology Dg Chest Port 1 View  Result Date: 03/21/2019 CLINICAL DATA:  Altered mental status EXAM: PORTABLE CHEST 1 VIEW COMPARISON:  10/05/2017 chest radiograph. FINDINGS: Stable cardiomediastinal silhouette with normal heart size. No pneumothorax. No pleural effusion. Stable calcified peripheral lower left lung granuloma. No pulmonary edema. No acute consolidative airspace disease. IMPRESSION: No active disease. Electronically Signed   By: Ilona Sorrel M.D.   On: 03/21/2019 15:09   Ct Head Code Stroke Wo Contrast  Result Date: 03/21/2019 CLINICAL DATA:  Code stroke.  Aphasia and memory loss. EXAM: CT HEAD WITHOUT CONTRAST TECHNIQUE: Contiguous axial images were obtained from the base of the skull through the vertex without intravenous contrast. COMPARISON:  08/13/2018 MRI.  09/08/2017 CT FINDINGS: Brain: No sign of acute infarction. Age related atrophy. Chronic small-vessel ischemic changes of the white matter. No mass lesion, hemorrhage, hydrocephalus or extra-axial collection. Vascular: There is atherosclerotic calcification of the major vessels at the base of the brain. Skull: Negative Sinuses/Orbits: Clear/normal Other: None ASPECTS ([place] Stroke Program Early CT Score) - Ganglionic level infarction (caudate, lentiform nuclei, internal capsule, insula, M1-M3 cortex): 7 - Supraganglionic infarction (M4-M6 cortex): 3 Total score (0-10 with 10 being normal): 10 IMPRESSION: 1. No acute finding. Atrophy and chronic small-vessel ischemic changes. 2. ASPECTS is 10. 3. These results were communicated to Dr. Cheral Marker at [place] 7/4/2020by text page via the [facility] messaging system. Electronically Signed    By: Nelson Chimes M.D.   On: 03/21/2019 13:57    Procedures Procedures (including critical care time)  Medications Ordered in ED Medications  sodium chloride flush (NS) 0.9 % injection 3 mL (has no administration in time range)  aspirin EC tablet 650 mg (has no administration in time range)     Initial Impression / Assessment  and Plan / ED Course  I have reviewed the triage vital signs and the nursing notes.  Pertinent labs & imaging results that were available during my care of the patient were reviewed by me and considered in my medical decision making (see chart for details).        Patient presented to the ED by EMS as a code stroke. She is mildly confused but without focal deficits on ED arrival. She was not found to be a TPA candidate. She was evaluated by neurology. Per neurology recommendations plan to obtain MRI/MRI and admit for observation. Final Clinical Impressions(s) / ED Diagnoses   Final diagnoses:  None    ED Discharge Orders    None       Tilden Fossaees, Arista Kettlewell, MD 03/21/19 1536

## 2019-03-22 ENCOUNTER — Observation Stay (HOSPITAL_BASED_OUTPATIENT_CLINIC_OR_DEPARTMENT_OTHER): Payer: PPO

## 2019-03-22 DIAGNOSIS — G459 Transient cerebral ischemic attack, unspecified: Secondary | ICD-10-CM

## 2019-03-22 DIAGNOSIS — R4182 Altered mental status, unspecified: Secondary | ICD-10-CM

## 2019-03-22 LAB — GLUCOSE, CAPILLARY
Glucose-Capillary: 97 mg/dL (ref 70–99)
Glucose-Capillary: 175 mg/dL — ABNORMAL HIGH (ref 70–99)
Glucose-Capillary: 149 mg/dL — ABNORMAL HIGH (ref 70–99)
Glucose-Capillary: 181 mg/dL — ABNORMAL HIGH (ref 70–99)

## 2019-03-22 LAB — CBC
HCT: 28.9 % — ABNORMAL LOW (ref 36.0–46.0)
Hemoglobin: 8.8 g/dL — ABNORMAL LOW (ref 12.0–15.0)
MCH: 24.2 pg — ABNORMAL LOW (ref 26.0–34.0)
MCHC: 30.4 g/dL (ref 30.0–36.0)
MCV: 79.4 fL — ABNORMAL LOW (ref 80.0–100.0)
Platelets: 287 10*3/uL (ref 150–400)
RBC: 3.64 MIL/uL — ABNORMAL LOW (ref 3.87–5.11)
RDW: 16.2 % — ABNORMAL HIGH (ref 11.5–15.5)
WBC: 8.4 10*3/uL (ref 4.0–10.5)
nRBC: 0 % (ref 0.0–0.2)

## 2019-03-22 LAB — BASIC METABOLIC PANEL
Anion gap: 11 (ref 5–15)
BUN: 16 mg/dL (ref 8–23)
CO2: 23 mmol/L (ref 22–32)
Calcium: 8.6 mg/dL — ABNORMAL LOW (ref 8.9–10.3)
Chloride: 106 mmol/L (ref 98–111)
Creatinine, Ser: 1.13 mg/dL — ABNORMAL HIGH (ref 0.44–1.00)
GFR calc Af Amer: 47 mL/min — ABNORMAL LOW (ref >60)
GFR calc non Af Amer: 40 mL/min — ABNORMAL LOW (ref >60)
Glucose, Bld: 106 mg/dL — ABNORMAL HIGH (ref 70–99)
Potassium: 3.7 mmol/L (ref 3.5–5.1)
Sodium: 140 mmol/L (ref 135–145)

## 2019-03-22 LAB — ECHOCARDIOGRAM COMPLETE
Height: 62 in
Weight: 2673.74 oz

## 2019-03-22 LAB — LIPID PANEL
Cholesterol: 107 mg/dL (ref 0–200)
HDL: 33 mg/dL — ABNORMAL LOW (ref >40)
LDL Cholesterol: 47 mg/dL (ref 0–99)
Total CHOL/HDL Ratio: 3.2 RATIO
Triglycerides: 135 mg/dL (ref <150)
VLDL: 27 mg/dL (ref 0–40)

## 2019-03-22 LAB — URINALYSIS, ROUTINE W REFLEX MICROSCOPIC
Bilirubin Urine: NEGATIVE
Glucose, UA: 150 mg/dL — AB
Hgb urine dipstick: NEGATIVE
Ketones, ur: NEGATIVE mg/dL
Leukocytes,Ua: NEGATIVE
Nitrite: NEGATIVE
Protein, ur: NEGATIVE mg/dL
Specific Gravity, Urine: 1.011 (ref 1.005–1.030)
pH: 7 (ref 5.0–8.0)

## 2019-03-22 MED ORDER — LABETALOL HCL 5 MG/ML IV SOLN
5.0000 mg | INTRAVENOUS | Status: DC | PRN
  Administered 2019-03-22 – 2019-03-23 (×6): 5 mg via INTRAVENOUS
  Filled 2019-03-22 (×5): qty 4

## 2019-03-22 NOTE — Progress Notes (Signed)
Family Medicine Teaching Service Daily Progress Note Intern Pager: [phone]  Patient name: Melinda Lewis Medical record number: [id number] Date of birth: 1918-02-02 Age: 85 y.o. Gender: female  Primary Care Provider: Idelle Crouch, MD Consultants: Neurology Code Status: DNR  Pt Overview and Major Events to Date:  7/4 Patient admitted with tia 7/4 MR Brain 7/4 MRA head  Assessment and Plan:  TIA with hx of prior TIAs - CT head negative for acute bleed -Neurology following, appreciate recommendations.  - MRI and MRA - question of new infarct vs artifact  - Patient passed nurse bedside swallow test  - Echo   - Carotid ultrasound  - HTN corrected above 333 systolic per neurology  - 81mg  ASA per day  - Atorvastatin increased from home dose of 40mg >80mg   Hypertension: - BP this a.m. (7/5) - 545-625 systolic  - Plan for permissive HTN to 638 systolic per neurology - Continue patients Metoprolol and Imdur to prevent rebound angina  Hypothyroidism - Home meds - Synthroid 150 - Current TSH is 0.445 - Continue home synthroid.  Mild Anemia - Hemoglobin 8.8 this a.m. - Continue to monitor  Insomnia - Cont. Home meds - trazodone and melatonin  FEN/GI: Regular diet PPx: heparin  Disposition: Likely home pending TIA workup  Subjective:  Patient is a very pleasant 85yo female who presented to the ED with TIA. This morning patient states she feels great and asks when she can go home. She states she lives at home alone in [place] but that her sister will be able to stay with her a while after she leaves the hospital. She had no pain and said she had no issues overnight.  Objective: Temp:  [97.5 F (36.4 C)-99 F (37.2 C)] 98.2 F (36.8 C) (07/05 0727) Pulse Rate:  [79-96] 85 (07/05 0727) Resp:  [11-20] 16 (07/05 0727) BP: (156-217)/(45-78) 156/78 (07/05 0727) SpO2:  [94 %-97 %] 95 % (07/05 0727) Weight:  [75.8 kg] 75.8 kg (07/04 1331) Physical Exam: General: A&Ox3  and in no apparent distress Heart: Regular rate and rhythm with no murmurs appreciated Lungs: CTA bilaterally, no wheezing Abdomen: Bowel sounds present, no abdominal pain Skin: Warm and dry Extremities: No lower extremity edema  Neuro: Alert and oriented to person, place, year. CN2-12 in tact. Strength 5/5 in elbow flexion and extension bilaterally. Strength 5/5 in hip flexors, ankle plantarflexion and ankle dorsiflexion bilaterally. Fine touch sensation present and equal bilaterally in medial and lateral aspects of upper and lower limbs.  Laboratory: Recent Labs  Lab 03/21/19 1333 03/21/19 1339 03/22/19 0542  WBC 9.9  --  8.4  HGB 9.5* 11.2* 8.8*  HCT 31.7* 33.0* 28.9*  PLT 323  --  287   Recent Labs  Lab 03/21/19 1333 03/21/19 1339 03/22/19 0542  NA 136 136 140  K 4.4 4.4 3.7  CL 101 100 106  CO2 22  --  23  BUN 18 21 16   CREATININE 1.12* 1.10* 1.13*  CALCIUM 8.8*  --  8.6*  PROT 7.0  --   --   BILITOT 0.6  --   --   ALKPHOS 52  --   --   ALT 22  --   --   AST 24  --   --   GLUCOSE 121* 115* 106*     Imaging/Diagnostic Tests: Mr Angio Head Wo Contrast  Result Date: 03/21/2019 CLINICAL DATA:  85 year old female with code stroke, aphasia and memory loss. EXAM: MRI HEAD WITHOUT AND WITH CONTRAST MRA  HEAD WITHOUT CONTRAST TECHNIQUE: Multiplanar, multiecho pulse sequences of the brain and surrounding structures were obtained without and with intravenous contrast. Angiographic images of the head were obtained using MRA technique without contrast. CONTRAST:  7.5 milliliters Gadavist COMPARISON:  Head CT without contrast 1347 hours. Brain MRI 08/13/2018. FINDINGS: MRI HEAD FINDINGS Brain: There is a subtle, punctate area of subcortical white matter increased trace diffusion at the left temporal stem on series 9, image 64 and series 11, image 46. This is not obviously restricted on ADC. No other restricted diffusion. No midline shift, mass effect, evidence of mass lesion,  ventriculomegaly, extra-axial collection or acute intracranial hemorrhage. Cervicomedullary junction and pituitary are within normal limits. A small chronic infarct in the left lower cerebellum is stable. Otherwise stable mild for age patchy cerebral white matter T2 and FLAIR hyperintensity. Stable mild involvement of the left lentiform, and also the pons. No chronic blood products. No abnormal enhancement identified. No dural thickening. Vascular: Major intracranial vascular flow voids are stable. The major dural venous sinuses are enhancing and appear to be patent. Skull and upper cervical spine: Negative visible cervical spine. Normal bone marrow signal. Sinuses/Orbits: Stable. Other: Stable trace mastoid fluid. Visible internal auditory structures appear normal. Normal stylomastoid foramina. Scalp and face soft tissues appear negative. MRA HEAD FINDINGS Antegrade flow in the posterior circulation. Mildly dominant distal left vertebral artery. Patent PICA origins. No distal vertebral stenosis. Patent basilar artery, AICA origins, SCA origins and PCA origins. Posterior communicating arteries are diminutive or absent. Left PCA branches are within normal limits. There is a short segment moderate to severe stenosis of the right PCA P2 (series 1041, image 13) with otherwise normal right PCA branches. Antegrade flow in both ICA siphons. There is moderate bilateral siphon irregularity compatible with atherosclerosis. There is mild to moderate stenosis of the left anterior genu, and similar stenosis of the right supraclinoid segment. Both carotid termini are patent. MCA and ACA origins are patent. Visible bilateral ACA branches are within normal limits. Visible bilateral MCA branches are patent with only mild irregularity. IMPRESSION: 1. Questionable punctate white matter infarct in the left temporal stem (series 11, image 46), although I favor artifact instead. 2. Otherwise no acute intracranial abnormality and stable  MRI appearance of the brain since 2019. 3. Intracranial MRA is positive for intracranial atherosclerosis with - moderate stenosis of the bilateral ICA siphons. - moderate to severe stenosis of the right PCA P2 segment. Electronically Signed   By: Odessa FlemingH  Hall M.D.   On: 03/21/2019 17:13   Mr Laqueta JeanBrain W ONWo Contrast  Result Date: 03/21/2019 CLINICAL DATA:  85 year old female with code stroke, aphasia and memory loss. EXAM: MRI HEAD WITHOUT AND WITH CONTRAST MRA HEAD WITHOUT CONTRAST TECHNIQUE: Multiplanar, multiecho pulse sequences of the brain and surrounding structures were obtained without and with intravenous contrast. Angiographic images of the head were obtained using MRA technique without contrast. CONTRAST:  7.5 milliliters Gadavist COMPARISON:  Head CT without contrast 1347 hours. Brain MRI 08/13/2018. FINDINGS: MRI HEAD FINDINGS Brain: There is a subtle, punctate area of subcortical white matter increased trace diffusion at the left temporal stem on series 9, image 64 and series 11, image 46. This is not obviously restricted on ADC. No other restricted diffusion. No midline shift, mass effect, evidence of mass lesion, ventriculomegaly, extra-axial collection or acute intracranial hemorrhage. Cervicomedullary junction and pituitary are within normal limits. A small chronic infarct in the left lower cerebellum is stable. Otherwise stable mild for age patchy cerebral white matter  T2 and FLAIR hyperintensity. Stable mild involvement of the left lentiform, and also the pons. No chronic blood products. No abnormal enhancement identified. No dural thickening. Vascular: Major intracranial vascular flow voids are stable. The major dural venous sinuses are enhancing and appear to be patent. Skull and upper cervical spine: Negative visible cervical spine. Normal bone marrow signal. Sinuses/Orbits: Stable. Other: Stable trace mastoid fluid. Visible internal auditory structures appear normal. Normal stylomastoid foramina.  Scalp and face soft tissues appear negative. MRA HEAD FINDINGS Antegrade flow in the posterior circulation. Mildly dominant distal left vertebral artery. Patent PICA origins. No distal vertebral stenosis. Patent basilar artery, AICA origins, SCA origins and PCA origins. Posterior communicating arteries are diminutive or absent. Left PCA branches are within normal limits. There is a short segment moderate to severe stenosis of the right PCA P2 (series 1041, image 13) with otherwise normal right PCA branches. Antegrade flow in both ICA siphons. There is moderate bilateral siphon irregularity compatible with atherosclerosis. There is mild to moderate stenosis of the left anterior genu, and similar stenosis of the right supraclinoid segment. Both carotid termini are patent. MCA and ACA origins are patent. Visible bilateral ACA branches are within normal limits. Visible bilateral MCA branches are patent with only mild irregularity. IMPRESSION: 1. Questionable punctate white matter infarct in the left temporal stem (series 11, image 46), although I favor artifact instead. 2. Otherwise no acute intracranial abnormality and stable MRI appearance of the brain since 2019. 3. Intracranial MRA is positive for intracranial atherosclerosis with - moderate stenosis of the bilateral ICA siphons. - moderate to severe stenosis of the right PCA P2 segment. Electronically Signed   By: Odessa Fleming M.D.   On: 03/21/2019 17:13   Dg Chest Port 1 View  Result Date: 03/21/2019 CLINICAL DATA:  Altered mental status EXAM: PORTABLE CHEST 1 VIEW COMPARISON:  10/05/2017 chest radiograph. FINDINGS: Stable cardiomediastinal silhouette with normal heart size. No pneumothorax. No pleural effusion. Stable calcified peripheral lower left lung granuloma. No pulmonary edema. No acute consolidative airspace disease. IMPRESSION: No active disease. Electronically Signed   By: Delbert Phenix M.D.   On: 03/21/2019 15:09   Ct Head Code Stroke Wo  Contrast  Result Date: 03/21/2019 CLINICAL DATA:  Code stroke.  Aphasia and memory loss. EXAM: CT HEAD WITHOUT CONTRAST TECHNIQUE: Contiguous axial images were obtained from the base of the skull through the vertex without intravenous contrast. COMPARISON:  08/13/2018 MRI.  09/08/2017 CT FINDINGS: Brain: No sign of acute infarction. Age related atrophy. Chronic small-vessel ischemic changes of the white matter. No mass lesion, hemorrhage, hydrocephalus or extra-axial collection. Vascular: There is atherosclerotic calcification of the major vessels at the base of the brain. Skull: Negative Sinuses/Orbits: Clear/normal Other: None ASPECTS (Alberta Stroke Program Early CT Score) - Ganglionic level infarction (caudate, lentiform nuclei, internal capsule, insula, M1-M3 cortex): 7 - Supraganglionic infarction (M4-M6 cortex): 3 Total score (0-10 with 10 being normal): 10 IMPRESSION: 1. No acute finding. Atrophy and chronic small-vessel ischemic changes. 2. ASPECTS is 10. 3. These results were communicated to Dr. Otelia Limes at 1:56 pmon 7/4/2020by text page via the [facility] messaging system. Electronically Signed   By: Paulina Fusi M.D.   On: 03/21/2019 13:57    Jackelyn Poling, MD 03/22/2019, 9:43 AM PGY-1, [facility] Family Medicine FPTS Intern pager: [phone], text pages welcome

## 2019-03-22 NOTE — Evaluation (Signed)
Occupational Therapy Evaluation Patient Details Name: Melinda Lewis MRN: [id number] DOB: [birth year] Today's Date: 03/22/2019    History of Present Illness Melinda Lewis is a 85 y.o. female presenting with tia. Patient was having trouble following commands and was not recognize her family.  She has mild baseline dementia. CT head was negative for acute bleed.  MRI and MRA showed question of artifact versus new. PMH is significant for history of TIA, dementia, hyperlipidemia, hypertension, history of NSTEMI, CAD, HFpEF 60-65%, SMA stenosis, aortic atherosclerosis, type 2 diabetes   Clinical Impression   Pt PTA: Living with sister who is her primary caregiver. Pt reports requiring assist with transfers and LB ADL. Pt currently ambulating 5' to sink and back to recliner. Pt set-upA to supervisionA for UB ADL and fair balance in standing x4 mins for grooming. Pt minguardA for toilet hygiene. Pt performing LB dressing with totalA for sock donning. Pt states that she feels back to baseline. Pt is HOH,but A/O x4. Pt with no focal deficits at this time.  Pt reports using w/c most of the time for mobility, bu tpt able to ambulate with OT and RW a short distance. Pt transfers with minguardA. Pt does not require continued OT skilled services at this time as pt appears back to baseline. OT signing off.       Follow Up Recommendations  No OT follow up;Supervision/Assistance - 24 hour    Equipment Recommendations  None recommended by OT    Recommendations for Other Services       Precautions / Restrictions Precautions Precautions: Fall Restrictions Weight Bearing Restrictions: No      Mobility Bed Mobility Overal bed mobility: Needs Assistance Bed Mobility: Sidelying to Sit   Sidelying to sit: Supervision          Transfers Overall transfer level: Needs assistance Equipment used: Rolling walker (2 wheeled) Transfers: Sit to/from [facility] Sit to Stand: Min  guard Stand pivot transfers: Min guard       General transfer comment: minguardA fro stability in initial standing balance    Balance Overall balance assessment: Modified Independent                                         ADL either performed or assessed with clinical judgement   ADL Overall ADL's : At baseline                                       General ADL Comments: Pt transferred and ambulated 5' each way from bed  to sink and to recliner. Pt set-upA for grooming in standing. totalA for LB dress for donning socks.     Vision Baseline Vision/History: Wears glasses Wears Glasses: At all times Patient Visual Report: No change from baseline Vision Assessment?: Yes Eye Alignment: Within Functional Limits Ocular Range of Motion: Within Functional Limits Alignment/Gaze Preference: Within Defined Limits Tracking/Visual Pursuits: Able to track stimulus in all quads without difficulty Saccades: Within functional limits Additional Comments: no visual deficits noted     Perception Perception Perception Tested?: Yes Comments: WFLs   Praxis      Pertinent Vitals/Pain Pain Assessment: No/denies pain     Hand Dominance Right   Extremity/Trunk Assessment Upper Extremity Assessment Upper Extremity Assessment: Overall WFL for tasks assessed   Lower  Extremity Assessment Lower Extremity Assessment: Overall WFL for tasks assessed   Cervical / Trunk Assessment Cervical / Trunk Assessment: Normal   Communication Communication Communication: HOH   Cognition Arousal/Alertness: Awake/alert Behavior During Therapy: WFL for tasks assessed/performed Overall Cognitive Status: Within Functional Limits for tasks assessed                                     General Comments  no focal deficits identified    Exercises     Shoulder Instructions      Home Living Family/patient expects to be discharged to:: Private  residence Living Arrangements: Children Available Help at Discharge: Family;Available 24 hours/day Type of Home: House             Bathroom Shower/Tub: Walk-in Soil scientist Toilet: Handicapped height     Home Equipment: Shower seat;Wheelchair - manual   Additional Comments: lives with sister      Prior Functioning/Environment Level of Independence: Needs assistance  Gait / Transfers Assistance Needed: assisted with transfers from sister; uses w/c  mostly. ADL's / Homemaking Assistance Needed: set-upA for ADL and requires assist for LB ADL and all transfers.            OT Problem List:        OT Treatment/Interventions:      OT Goals(Current goals can be found in the care plan section)    OT Frequency:     Barriers to D/C:            Co-evaluation              AM-PAC OT "6 Clicks" Daily Activity     Outcome Measure Help from another person eating meals?: None Help from another person taking care of personal grooming?: None Help from another person toileting, which includes using toliet, bedpan, or urinal?: A Little Help from another person bathing (including washing, rinsing, drying)?: A Little Help from another person to put on and taking off regular upper body clothing?: A Little Help from another person to put on and taking off regular lower body clothing?: A Little 6 Click Score: 20   End of Session Equipment Utilized During Treatment: Gait belt;Rolling walker Nurse Communication: Mobility status  Activity Tolerance: Patient tolerated treatment well Patient left: in chair;with call bell/phone within reach;Other (comment)(RN and NT alerted of sitting in recliner)  OT Visit Diagnosis: Other abnormalities of gait and mobility (R26.89)                Time: 1030-1314 OT Time Calculation (min): 32 min Charges:  OT General Charges $OT Visit: 1 Visit OT Evaluation $OT Eval Moderate Complexity: 1 Mod OT Treatments $Self Care/Home Management :  8-22 mins  Revonda Standard Cecil Cranker) Glendell Docker OTR/L Acute Rehabilitation Services Pager: [phone] Office: [phone]   Lonzo Cloud 03/22/2019, 12:48 PM

## 2019-03-22 NOTE — Progress Notes (Signed)
SLP Cancellation Note  Patient Details Name: LEANER MORICI MRN: [id number] DOB: 1918-07-22   Cancelled treatment:       Reason Eval/Treat Not Completed: SLP screened, no needs identified, will sign off   Lorelei Heikkila, Katherene Ponto 03/22/2019, 11:44 AM

## 2019-03-22 NOTE — Discharge Summary (Signed)
[facility] Discharge Summary  Patient name: Melinda Lewis Medical record number: [id number] Date of birth: 02-20-1918 Age: 85 y.o. Gender: female Date of Admission: 03/21/2019  Date of Discharge: 03/27/19 Admitting Physician: Blane Ohara McDiarmid, MD  Primary Care Provider: Idelle Crouch, MD Consultants: Neurology  Indication for Hospitalization:  TIA  Discharge Diagnoses/Problem List:  TIA HTN Anemia Hypothyroidism DM  Disposition:  Home with hospice  Discharge Condition: Complex partial seizure-on medication. Medically stable for discharge  Discharge Exam:  General: Alert and oriented, no acute distress Heart: S1 and S2, no S3 and S4. No gallops or rubs Lungs: few bibasal crackles, respiratory effort normal  Abdomen: abdo soft non tender, bowel sounds present  Skin: Warm and dry Extremities: right calf swollen > left calf    Brief Hospital Course:  Patient was admitted to the ED with a TIA that occurred at her home around noon that day (7/4). She initially had a minor TIA at her home which resolved but was followed by facial droop and confused speech a few minutes later. Her family called EMS. In the ED she had no focal deficits on admission and was A&O x3. Neurology was consulted. Head CT was negative for acute bleed and MRI and MRA should questionable artifact vs new infarct as well as vascular stenosis. 650mg  aspirin loading dose was started followed by 81mg  per day. Echo showed normal left ventricle systolic function with ejection fraction of 60-65% and left ventricular diastolic function consistent with impaired relaxation. EEG showed no evidence of seizure activity.  Blood pressure became elevated on several occasions and patient was restarted on her home lisinopril at a reduced dose as her renal status was monitored. Her PRN labetalol was replaced with PRN hydralazine when BP exceeded 180.  Patient was scheduled for discharge on 7/7 with the  orders in and note signed when her nurse noticed a change in her speech. Dr. Leonie Man and myself were notified and promptly evaluated the patient. She was noted to be confused with intermittent aphasia and speech difficulties. She had a mix of word salad and clear speech but appeared alert. She was also noted to have tremors at this time. Repeat head Ct showed no acute intracranial abnormalities and repeat EEG showed no evidence of seizure. Patient continued to express confusion and at times would not respond to questions. Patient was noted on 7/9 to have a swollen right calf which was associated with warmth and a doppler was ordered for dvt rule-out which was negative. Patient was placed on overnight EEG on 7/9 and this showed no epileptiform discharges. It was consistent with a focal cerebral disturbance in the left central region. Blood cultures X 2 negative at 48hrs.   Kamrie's family was keen to get her home for home hospice. This was arranged for discharge on 7/10  Issues for Follow Up:  1. PCP for BP  2. Follow up with neuro in 6 weeks 3. Patient was started on keppra for concern of seizures, none identified on EEG  Significant Procedures: None  Significant Labs and Imaging:  Recent Labs  Lab 03/24/19 0730 03/25/19 0846 03/27/19 0331  WBC 9.1 13.7* 15.3*  HGB 8.8* 10.0* 9.8*  HCT 28.9* 31.4* 31.8*  PLT 301 311 317   Recent Labs  Lab 03/21/19 1333  03/22/19 0542 03/23/19 0450 03/24/19 0730 03/25/19 0846 03/27/19 0331  NA 136   < > 140 143 137 134* 139  K 4.4   < > 3.7 3.6 3.7  3.1* 2.9*  CL 101   < > 106 108 105 103 110  CO2 22  --  23 23 23  19* 18*  GLUCOSE 121*   < > 106* 163* 131* 118* 107*  BUN 18   < > 16 17 14 13 17   CREATININE 1.12*   < > 1.13* 1.21* 0.93 0.99 0.86  CALCIUM 8.8*  --  8.6* 8.5* 8.3* 8.2* 7.6*  ALKPHOS 52  --   --   --   --   --   --   AST 24  --   --   --   --   --   --   ALT 22  --   --   --   --   --   --   ALBUMIN 3.5  --   --   --   --   --   --     < > = values in this interval not displayed.     Results/Tests Pending at Time of Discharge: blood culture negativex2days  Discharge Medications:  Allergies as of 03/27/2019      Reactions   Clonidine Derivatives Other (See Comments)   MADE THE PATIENT PASS OUT   Cefuroxime Axetil Nausea Only, Other (See Comments)   Cramps, also   Septra [sulfamethoxazole-trimethoprim] Nausea And Vomiting   Whiskey [alcohol] Hives   Epinephrine Palpitations, Other (See Comments)   Heart races   Latex Rash, Other (See Comments)   Causes sores on the skin   Penicillins Palpitations   Heart races Did it involve swelling of the face/tongue/throat, SOB, or low BP? Unk Did it involve sudden or severe rash/hives, skin peeling, or any reaction on the inside of your mouth or nose? Unk Did you need to seek medical attention at a hospital or doctor's office? Yes When did it last happen? "During [employer]" (1940's) If all above answers are "NO", may proceed with cephalosporin use.   Tape Rash, Other (See Comments)   Latex tape causes sores on the skin      Medication List    STOP taking these medications   LORazepam 0.5 MG tablet Commonly known as: Ativan     TAKE these medications   acetaminophen 650 MG CR tablet Commonly known as: TYLENOL Take 650 mg by mouth 3 (three) times daily.   amLODipine 2.5 MG tablet Commonly known as: NORVASC Take 2.5 mg by mouth daily at 10 pm.   aspirin EC 81 MG tablet Take 81 mg by mouth daily.   atorvastatin 80 MG tablet Commonly known as: LIPITOR Take 1 tablet (80 mg total) by mouth daily at 6 PM. What changed:   medication strength  See the new instructions.   Bio-Flax 1000 MG Caps Take 1,000 mg by mouth daily at 10 pm.   donepezil 5 MG tablet Commonly known as: ARICEPT Take 1 tablet (5 mg total) by mouth daily with supper.   ferrous sulfate 325 (65 FE) MG tablet Take 1 tablet (325 mg total) by mouth daily with breakfast.   hydrALAZINE 50 MG  tablet Commonly known as: APRESOLINE Take 0.5 tablets (25 mg total) by mouth 3 (three) times daily. What changed:   how much to take  when to take this  reasons to take this   isosorbide mononitrate 30 MG 24 hr tablet Commonly known as: IMDUR Take 30 mg by mouth every morning.   levETIRAcetam 500 MG tablet Commonly known as: Keppra Take 1 tablet (500 mg total) by  mouth 2 (two) times daily.   levothyroxine 150 MCG tablet Commonly known as: SYNTHROID Take 150 mcg by mouth daily before breakfast.   lisinopril 40 MG tablet Commonly known as: ZESTRIL Take 1 tablet (40 mg total) by mouth daily.   loratadine 10 MG tablet Commonly known as: CLARITIN Take 10 mg by mouth daily at 6 PM.   magnesium oxide 400 MG tablet Commonly known as: MAG-OX Take 400 mg by mouth daily.   Melatonin 5 MG Tabs Take 5 mg by mouth at bedtime.   metFORMIN 500 MG tablet Commonly known as: GLUCOPHAGE Take 500 mg by mouth 2 (two) times daily after a meal.   metoprolol succinate 25 MG 24 hr tablet Commonly known as: TOPROL-XL TAKE ONE TABLET BY MOUTH EVERY DAY   nitroGLYCERIN 0.4 MG SL tablet Commonly known as: NITROSTAT Place 1 tablet (0.4 mg total) under the tongue every 5 (five) minutes as needed for chest pain.   ondansetron 4 MG disintegrating tablet Commonly known as: Zofran ODT Take 1 tablet (4 mg total) by mouth every 8 (eight) hours as needed for nausea or vomiting.   pantoprazole 40 MG tablet Commonly known as: PROTONIX Take 40 mg by mouth daily at 6 PM.   polyethylene glycol 17 g packet Commonly known as: MIRALAX / GLYCOLAX Take 17 g by mouth daily as needed for mild constipation or moderate constipation.   QUEtiapine 25 MG tablet Commonly known as: SEROQUEL Take 12.5 mg by mouth at bedtime.   vitamin B-12 1000 MCG tablet Commonly known as: CYANOCOBALAMIN Take 1,000 mcg by mouth daily at 6 PM.       Discharge Instructions: Please refer to Patient Instructions section  of EMR for full details.  Patient was counseled important signs and symptoms that should prompt return to medical care, changes in medications, dietary instructions, activity restrictions, and follow up appointments.   Follow-Up Appointments: Follow-up Information    [facility] Follow up.   Why: They will contact you for the first visit. Contact information: [phone]          Marthenia RollingBland, Zaahir Pickney, DO 03/27/2019, 4:38 PM PGY-3, [facility]

## 2019-03-22 NOTE — Progress Notes (Signed)
STROKE TEAM PROGRESS NOTE   HISTORY OF PRESENT ILLNESS (per Dr Rodman Key Melinda Lewis is an 85 y.o. female presenting via EMS as a Code Stroke. She had an initial spell observed by family at 41 which resolved. A second spell of AMS and left sided facial droop occurred at 1245 today. EMS was called. On EMS arrival, she was noted to be unable to recognize family members and had difficulty following commands. Her BP was 248/98 in the field. Her PMHx includes dementia and Melinda.   On arrival to the ED, her exam was nonfocal but she was mildly confused. CT head was negative for acute abnormality. MRI and MRA were then ordered. MRI showed atrophy, chronic small vessel ischemic changes and a faint punctate artifactual hyperintensity in the left temporal lobe on the DWI sequence. MRA revealed moderate stenosis of the bilateral ICA siphons and moderate to severe stenosis of the right PCA P2 segment.  Home medications include ASA and atorvastatin.   Stroke risk factors include CHF, CAD, DM, HTN, Melinda and HLD.   tPA given: No, nonfocal neurological exam LKN: 12:45 PM   SUBJECTIVE (INTERVAL HISTORY) Her occupational therapist is at the bedside.  I have personally reviewed history of presenting illness with the patient.  She states that she was brought in for confusion for secondary frequently elevated blood pressure of 248/98.  She states this is happened a couple of times in the past.  She feels she is back to her baseline now.  MRI scan of the brain personally reviewed shows questionable punctate left temporal white matter weekly diffusion hyperintensity which is likely an artifact or unrelated subacute infarct versus  ictal finding    OBJECTIVE Vitals:   03/22/19 0000 03/22/19 0126 03/22/19 0327 03/22/19 0727  BP: (!) 179/59 (!) 179/59 (!) 166/59 (!) 156/78  Pulse: 81 81 81 85  Resp: 16 18 16 16   Temp: 97.9 F (36.6 C) 97.9 F (36.6 C) (!) 97.5 F (36.4 C) 98.2 F (36.8 C)  TempSrc: Oral  Oral Oral Oral  SpO2: 95% 94% 97% 95%  Weight:      Height:        CBC:  Recent Labs  Lab 03/21/19 1333 03/21/19 1339 03/22/19 0542  WBC 9.9  --  8.4  NEUTROABS 5.1  --   --   HGB 9.5* 11.2* 8.8*  HCT 31.7* 33.0* 28.9*  MCV 79.6*  --  79.4*  PLT 323  --  287    Basic Metabolic Panel:  Recent Labs  Lab 03/21/19 1333 03/21/19 1339 03/22/19 0542  NA 136 136 140  K 4.4 4.4 3.7  CL 101 100 106  CO2 22  --  23  GLUCOSE 121* 115* 106*  BUN 18 21 16   CREATININE 1.12* 1.10* 1.13*  CALCIUM 8.8*  --  8.6*    Lipid Panel:     Component Value Date/Time   CHOL 107 03/22/2019 0542   TRIG 135 03/22/2019 0542   HDL 33 (L) 03/22/2019 0542   CHOLHDL 3.2 03/22/2019 0542   VLDL 27 03/22/2019 0542   LDLCALC 47 03/22/2019 0542   HgbA1c:  Lab Results  Component Value Date   HGBA1C 7.4 (H) 03/21/2019   Urine Drug Screen: No results found for: LABOPIA, COCAINSCRNUR, LABBENZ, AMPHETMU, THCU, LABBARB  Alcohol Level No results found for: [facility]  IMAGING  Mr Angio Head Wo Contrast Mr Lodema Pilot Contrast 03/21/2019 IMPRESSION:  1. Questionable punctate white matter infarct in the left temporal stem (series  11, image 46), although I favor artifact instead.  2. Otherwise no acute intracranial abnormality and stable MRI appearance of the brain since 2019.  3. Intracranial MRA is positive for intracranial atherosclerosis with - moderate stenosis of the bilateral ICA siphons. - moderate to severe stenosis of the right PCA P2 segment.   Dg Chest Port 1 View 03/21/2019 IMPRESSION:  No active disease.   Ct Head Code Stroke Wo Contrast 03/21/2019 IMPRESSION:  1. No acute finding. Atrophy and chronic small-vessel ischemic changes.  2. ASPECTS is 10.    Transthoracic Echocardiogram  00/00/2020 Pending   EKG - SR rate 87 BPM. (See cardiology reading for complete details)   PHYSICAL EXAM Blood pressure (!) 156/78, pulse 85, temperature 98.2 F (36.8 C), temperature source Oral, resp.  rate 16, height 5\' 2"  (1.575 m), weight 75.8 kg, SpO2 95 %. Pleasant elderly Caucasian lady currently not in distress. . Afebrile. Head is nontraumatic. Neck is supple without bruit.    Cardiac exam no murmur or gallop. Lungs are clear to auscultation. Distal pulses are well felt. Neurological Exam ;  Awake  Alert oriented x 3.  Diminished attention, registration and recall.  Normal speech and language.eye movements full without nystagmus.fundi were not visualized. Vision acuity and fields appear normal. Hearing is diminished bilaterally . Palatal movements are normal. Face symmetric. Tongue midline. Normal strength, tone, reflexes and coordination. Normal sensation. Gait deferred.         ASSESSMENT/PLAN [name] is a 85 y.o. female with history of CHF, cardiomyopathy, CAD, DM, HTN, anemia, hypothyroidism, dementia, Melinda and HLD presenting with AMS, facial droop and BP 248/98. She did not receive IV t-PA due to non focal exam.  Stroke vs Melinda:   Resultant no deficits  CT head -no acute finding  MRI head - questionable punctate white matter infarct in the left temporal stem (series 11, image 46), although artifact favored instead.   MRA head - moderate stenosis of the bilateral ICA siphons - moderate to severe stenosis of the right PCA P2 segment.   CTA H&N - not performed  Carotid Doppler  - not ordered  2D Echo -normal EF 60 to 65%.    EEG - pending  [employer] Virus 2 - negative  LDL - 47  HgbA1c - 7.4  UDS - not performed  VTE prophylaxis - [place] Heparin  Diet - regular  aspirin 81 mg daily prior to admission, now on aspirin 81 mg daily  Patient counseled to be compliant with her antithrombotic medications  Ongoing aggressive stroke risk factor management  Therapy recommendations:  pending  Disposition:  Pending  Hypertension  Blood pressure somewhat high at times but within post stroke parameters . Permissive hypertension (OK if < 220/120) but  gradually normalize in 5-7 days . Long-term BP goal normotensive  Hyperlipidemia  Lipid lowering medication PTA:   Lipitor 40 mg daily  LDL 47, goal < 70  Current lipid lowering medication: Lipitor 80 mg daily  Continue statin at discharge  Diabetes  HgbA1c 7.4, goal < 7.0  Uncontrolled  Other Stroke Risk Factors  Advanced age  Former cigarette smoker - quit  Obesity, Body mass index is 30.56 kg/m., recommend weight loss, diet and exercise as appropriate   Family hx stroke - not on file  Hx of Melinda 2010  Coronary artery disease   Other Active Problems  Anemia - Hb - 9.5->11.2->8.8  Creatinine - 1.12->1.10->1.13 Moderate stenosis of the bilateral ICA siphons by MRA head -likely  asymptomatic  Hospital day # 0  I have personally obtained history,examined this patient, reviewed notes, independently viewed imaging studies, participated in medical decision making and plan of care.ROS completed by me personally and pertinent positives fully documented  I have made any additions or clarifications directly to the above note.  She presented with transient confusion in the setting of significantly elevated blood pressure which now appears to have improved.  MRI scan does show a tiny punctate left temporal white matter weekly diffusion positive hyperintensity which is of questionable significance and cannot explain her presentation.  This could be a silent subacute lacunar infarct versus ictal MRI finding.  Check EEG. Greater than 50% time during this 25-minute visit was spent on counseling and coordination of care about her neurological presentation and discussion of evaluation and treatment plan and answering questions Melinda Bollen, MD Medical Director [facility] Pager: [phone] 03/22/2019 12:32 PM   To contact Stroke Continuity provider, please refer to [email or web address]. After hours, contact General Neurology

## 2019-03-22 NOTE — Evaluation (Addendum)
Physical Therapy Evaluation Patient Details Name: Melinda Lewis MRN: [id number] DOB: [birth year] Today's Date: 03/22/2019   History of Present Illness  Melinda Lewis is a 85 y.o. female presenting with tia. Patient was having trouble following commands and was not recognize her family.  She has mild baseline dementia. CT head was negative for acute bleed.  MRI and MRA showed question of artifact versus new. PMH is significant for history of TIA, dementia, hyperlipidemia, hypertension, history of NSTEMI, CAD, HFpEF 60-65%, SMA stenosis, aortic atherosclerosis, type 2 diabetes  Clinical Impression  Pt presents to PT close to baseline with mobility. Will follow acutely but doubt she will need PT at time of dc.     Follow Up Recommendations No PT follow up    Equipment Recommendations  None recommended by PT    Recommendations for Other Services       Precautions / Restrictions Precautions Precautions: Fall Restrictions Weight Bearing Restrictions: No      Mobility  Bed Mobility               General bed mobility comments: Pt up in chair  Transfers Overall transfer level: Needs assistance Equipment used: Rolling walker (2 wheeled) Transfers: Sit to/from [employer] Transfers Sit to Stand: Supervision         General transfer comment: supervision for safety and lines  Ambulation/Gait Ambulation/Gait assistance: Supervision Gait Distance (Feet): 60 Feet Assistive device: Rolling walker (2 wheeled) Gait Pattern/deviations: Decreased step length - right;Decreased stride length Gait velocity: decr Gait velocity interpretation: <1.31 ft/sec, indicative of household ambulator General Gait Details: supervision for lines/safety  Stairs            Wheelchair Mobility    Modified Rankin (Stroke Patients Only) Modified Rankin (Stroke Patients Only) Pre-Morbid Rankin Score: Moderate disability Modified Rankin: Moderate disability     Balance Overall  balance assessment: Mild deficits observed, not formally tested                                           Pertinent Vitals/Pain Pain Assessment: No/denies pain    Home Living Family/patient expects to be discharged to:: Private residence Living Arrangements: Children Available Help at Discharge: Family;Available 24 hours/day Type of Home: House         Home Equipment: Shower seat;Wheelchair - manual Additional Comments: lives with sister    Prior Function Level of Independence: Needs assistance   Gait / Transfers Assistance Needed: assisted with transfers from sister; uses w/c  mostly.  ADL's / Homemaking Assistance Needed: set-upA for ADL and requires assist for LB ADL and all transfers.        Hand Dominance   Dominant Hand: Right    Extremity/Trunk Assessment   Upper Extremity Assessment Upper Extremity Assessment: Defer to OT evaluation    Lower Extremity Assessment Lower Extremity Assessment: Overall WFL for tasks assessed    Cervical / Trunk Assessment Cervical / Trunk Assessment: Normal  Communication   Communication: HOH  Cognition Arousal/Alertness: Awake/alert Behavior During Therapy: WFL for tasks assessed/performed Overall Cognitive Status: Within Functional Limits for tasks assessed                                        General Comments      Exercises  Assessment/Plan    PT Assessment Patient needs continued PT services  PT Problem List Decreased mobility;Decreased balance;Decreased activity tolerance       PT Treatment Interventions DME instruction;Gait training;Functional mobility training;Therapeutic activities;Therapeutic exercise;Balance training;Patient/family education    PT Goals (Current goals can be found in the Care Plan section)  Acute Rehab PT Goals Patient Stated Goal: return home PT Goal Formulation: With patient Time For Goal Achievement: 04/05/19 Potential to Achieve Goals:  Good    Frequency Min 3X/week   Barriers to discharge        Co-evaluation               AM-PAC PT "6 Clicks" Mobility  Outcome Measure Help needed turning from your back to your side while in a flat bed without using bedrails?: None Help needed moving from lying on your back to sitting on the side of a flat bed without using bedrails?: None Help needed moving to and from a bed to a chair (including a wheelchair)?: A Little Help needed standing up from a chair using your arms (e.g., wheelchair or bedside chair)?: A Little Help needed to walk in hospital room?: A Little Help needed climbing 3-5 steps with a railing? : A Little 6 Click Score: 20    End of Session Equipment Utilized During Treatment: Gait belt Activity Tolerance: Patient tolerated treatment well Patient left: in chair;with call bell/phone within reach   PT Visit Diagnosis: Unsteadiness on feet (R26.81)    Time: 1610-9604 PT Time Calculation (min) (ACUTE ONLY): 15 min   Charges:   PT Evaluation $PT Eval Low Complexity: 1 Low          [place] Pager [phone] Office [place] 03/22/2019, 2:54 PM

## 2019-03-22 NOTE — Progress Notes (Signed)
Echocardiogram 2D Echocardiogram has been performed.  Melinda Lewis 03/22/2019, 11:18 AM

## 2019-03-23 ENCOUNTER — Encounter (HOSPITAL_COMMUNITY): Payer: Self-pay

## 2019-03-23 ENCOUNTER — Observation Stay (HOSPITAL_COMMUNITY): Payer: PPO

## 2019-03-23 DIAGNOSIS — R29810 Facial weakness: Secondary | ICD-10-CM | POA: Diagnosis present

## 2019-03-23 DIAGNOSIS — Z515 Encounter for palliative care: Secondary | ICD-10-CM | POA: Diagnosis not present

## 2019-03-23 DIAGNOSIS — E039 Hypothyroidism, unspecified: Secondary | ICD-10-CM | POA: Diagnosis present

## 2019-03-23 DIAGNOSIS — I680 Cerebral amyloid angiopathy: Secondary | ICD-10-CM | POA: Diagnosis present

## 2019-03-23 DIAGNOSIS — I252 Old myocardial infarction: Secondary | ICD-10-CM | POA: Diagnosis not present

## 2019-03-23 DIAGNOSIS — G459 Transient cerebral ischemic attack, unspecified: Secondary | ICD-10-CM | POA: Diagnosis present

## 2019-03-23 DIAGNOSIS — I6621 Occlusion and stenosis of right posterior cerebral artery: Secondary | ICD-10-CM | POA: Diagnosis present

## 2019-03-23 DIAGNOSIS — D649 Anemia, unspecified: Secondary | ICD-10-CM | POA: Diagnosis present

## 2019-03-23 DIAGNOSIS — R4701 Aphasia: Secondary | ICD-10-CM | POA: Diagnosis not present

## 2019-03-23 DIAGNOSIS — Z7189 Other specified counseling: Secondary | ICD-10-CM | POA: Diagnosis not present

## 2019-03-23 DIAGNOSIS — R410 Disorientation, unspecified: Secondary | ICD-10-CM | POA: Diagnosis not present

## 2019-03-23 DIAGNOSIS — Z66 Do not resuscitate: Secondary | ICD-10-CM | POA: Diagnosis present

## 2019-03-23 DIAGNOSIS — R4702 Dysphasia: Secondary | ICD-10-CM | POA: Diagnosis not present

## 2019-03-23 DIAGNOSIS — I672 Cerebral atherosclerosis: Secondary | ICD-10-CM | POA: Diagnosis present

## 2019-03-23 DIAGNOSIS — I82401 Acute embolism and thrombosis of unspecified deep veins of right lower extremity: Secondary | ICD-10-CM | POA: Diagnosis not present

## 2019-03-23 DIAGNOSIS — G40209 Localization-related (focal) (partial) symptomatic epilepsy and epileptic syndromes with complex partial seizures, not intractable, without status epilepticus: Secondary | ICD-10-CM | POA: Diagnosis present

## 2019-03-23 DIAGNOSIS — I700 Atherosclerosis of aorta: Secondary | ICD-10-CM | POA: Diagnosis present

## 2019-03-23 DIAGNOSIS — Z20828 Contact with and (suspected) exposure to other viral communicable diseases: Secondary | ICD-10-CM | POA: Diagnosis present

## 2019-03-23 DIAGNOSIS — E854 Organ-limited amyloidosis: Secondary | ICD-10-CM | POA: Diagnosis present

## 2019-03-23 DIAGNOSIS — E876 Hypokalemia: Secondary | ICD-10-CM | POA: Diagnosis not present

## 2019-03-23 DIAGNOSIS — I2510 Atherosclerotic heart disease of native coronary artery without angina pectoris: Secondary | ICD-10-CM | POA: Diagnosis present

## 2019-03-23 DIAGNOSIS — R4182 Altered mental status, unspecified: Secondary | ICD-10-CM | POA: Diagnosis present

## 2019-03-23 DIAGNOSIS — I5022 Chronic systolic (congestive) heart failure: Secondary | ICD-10-CM | POA: Diagnosis present

## 2019-03-23 DIAGNOSIS — I110 Hypertensive heart disease with heart failure: Secondary | ICD-10-CM | POA: Diagnosis present

## 2019-03-23 DIAGNOSIS — G9349 Other encephalopathy: Secondary | ICD-10-CM | POA: Diagnosis present

## 2019-03-23 DIAGNOSIS — F0390 Unspecified dementia without behavioral disturbance: Secondary | ICD-10-CM | POA: Diagnosis present

## 2019-03-23 DIAGNOSIS — I255 Ischemic cardiomyopathy: Secondary | ICD-10-CM | POA: Diagnosis present

## 2019-03-23 DIAGNOSIS — E785 Hyperlipidemia, unspecified: Secondary | ICD-10-CM | POA: Diagnosis present

## 2019-03-23 LAB — BASIC METABOLIC PANEL
Anion gap: 12 (ref 5–15)
BUN: 17 mg/dL (ref 8–23)
CO2: 23 mmol/L (ref 22–32)
Calcium: 8.5 mg/dL — ABNORMAL LOW (ref 8.9–10.3)
Chloride: 108 mmol/L (ref 98–111)
Creatinine, Ser: 1.21 mg/dL — ABNORMAL HIGH (ref 0.44–1.00)
GFR calc Af Amer: 43 mL/min — ABNORMAL LOW (ref >60)
GFR calc non Af Amer: 37 mL/min — ABNORMAL LOW (ref >60)
Glucose, Bld: 163 mg/dL — ABNORMAL HIGH (ref 70–99)
Potassium: 3.6 mmol/L (ref 3.5–5.1)
Sodium: 143 mmol/L (ref 135–145)

## 2019-03-23 LAB — GLUCOSE, CAPILLARY
Glucose-Capillary: 132 mg/dL — ABNORMAL HIGH (ref 70–99)
Glucose-Capillary: 154 mg/dL — ABNORMAL HIGH (ref 70–99)
Glucose-Capillary: 168 mg/dL — ABNORMAL HIGH (ref 70–99)
Glucose-Capillary: 171 mg/dL — ABNORMAL HIGH (ref 70–99)

## 2019-03-23 LAB — CBC
HCT: 29 % — ABNORMAL LOW (ref 36.0–46.0)
Hemoglobin: 8.8 g/dL — ABNORMAL LOW (ref 12.0–15.0)
MCH: 24.4 pg — ABNORMAL LOW (ref 26.0–34.0)
MCHC: 30.3 g/dL (ref 30.0–36.0)
MCV: 80.3 fL (ref 80.0–100.0)
Platelets: 291 10*3/uL (ref 150–400)
RBC: 3.61 MIL/uL — ABNORMAL LOW (ref 3.87–5.11)
RDW: 16.3 % — ABNORMAL HIGH (ref 11.5–15.5)
WBC: 9.2 10*3/uL (ref 4.0–10.5)
nRBC: 0 % (ref 0.0–0.2)

## 2019-03-23 MED ORDER — HYDRALAZINE HCL 20 MG/ML IJ SOLN
5.0000 mg | INTRAMUSCULAR | Status: DC | PRN
  Administered 2019-03-23 – 2019-03-24 (×3): 5 mg via INTRAVENOUS
  Filled 2019-03-23 (×3): qty 1

## 2019-03-23 MED ORDER — LISINOPRIL 10 MG PO TABS
10.0000 mg | ORAL_TABLET | Freq: Every day | ORAL | Status: DC
  Administered 2019-03-23 – 2019-03-26 (×4): 10 mg via ORAL
  Filled 2019-03-23 (×3): qty 1
  Filled 2019-03-23: qty 4

## 2019-03-23 NOTE — Progress Notes (Signed)
Bedside EEG completed, results pending. 

## 2019-03-23 NOTE — Progress Notes (Signed)
Family Medicine Teaching Service Daily Progress Note Intern Pager: [phone]  Patient name: Melinda MillersMildred P Lewis Medical record number: [id number] Date of birth: 1917-11-20 Age: 85 y.o. Gender: female  Primary Care Provider: Marguarite ArbourSparks, Jeffrey D, MD Consultants: Neurology Code Status: DNR  Pt Overview and Major Events to Date:  7/4 Patient admitted with tia 7/4 MR Brain 7/4 MRA head  Assessment and Plan:  TIA with hx of prior TIAs - CT head negative for acute bleed -Neurology following, appreciate recommendations.  - MRI and MRA - question of new infarct vs artifact  - Patient passed nurse bedside swallow test  - Echo   - Carotid ultrasound  - HTN corrected above 180 systolic per neurology  - 81mg  ASA per day  - Atorvastatin increased from home dose of 40mg >80mg   Hypertension: - BP this a.m. (7/6) - 193/102 - Plan for permissive HTN to 180 systolic per neurology - Continue patients Metoprolol and Imdur to prevent rebound angina - Restart lisinopril reduced to 10mg   Hypothyroidism - Home meds - Synthroid 150 - Current TSH is 0.445 - Continue home synthroid.  Mild Anemia - Hbg 8.8 this morning (7/6) - Continue monitoring   Insomnia - Cont. Home meds - trazodone and melatonin  FEN/GI: Regular diet PPx: heparin  Disposition: Likely home pending TIA workup  Subjective:  Patient continues to have hypertension with systolic readings as high as the low 200s. Plan to restart patient's home lisinopril but at a lower dose due to her renal status. No complaints otherwise.  Objective: Temp:  [96.9 F (36.1 C)-98.6 F (37 C)] 96.9 F (36.1 C) (07/06 1120) Pulse Rate:  [75-96] 82 (07/06 1120) Resp:  [16-20] 20 (07/06 1120) BP: (188-235)/(59-102) 235/96 (07/06 1120) SpO2:  [93 %-98 %] 98 % (07/06 1120) Physical Exam: General: Alert and oriented in no apparent distress Heart: Regular rate and rhythm with no murmurs appreciated Lungs: CTA bilaterally, no wheezing Abdomen: Bowel  sounds present, no abdominal pain Skin: Warm and dry Extremities: No lower extremity edema  Laboratory: Recent Labs  Lab 03/21/19 1333 03/21/19 1339 03/22/19 0542 03/23/19 0450  WBC 9.9  --  8.4 9.2  HGB 9.5* 11.2* 8.8* 8.8*  HCT 31.7* 33.0* 28.9* 29.0*  PLT 323  --  287 291   Recent Labs  Lab 03/21/19 1333 03/21/19 1339 03/22/19 0542 03/23/19 0450  NA 136 136 140 143  K 4.4 4.4 3.7 3.6  CL 101 100 106 108  CO2 22  --  23 23  BUN 18 21 16 17   CREATININE 1.12* 1.10* 1.13* 1.21*  CALCIUM 8.8*  --  8.6* 8.5*  PROT 7.0  --   --   --   BILITOT 0.6  --   --   --   ALKPHOS 52  --   --   --   ALT 22  --   --   --   AST 24  --   --   --   GLUCOSE 121* 115* 106* 163*     Imaging/Diagnostic Tests: Mr Angio Head Wo Contrast  Result Date: 03/21/2019 CLINICAL DATA:  85 year old female with code stroke, aphasia and memory loss. EXAM: MRI HEAD WITHOUT AND WITH CONTRAST MRA HEAD WITHOUT CONTRAST TECHNIQUE: Multiplanar, multiecho pulse sequences of the brain and surrounding structures were obtained without and with intravenous contrast. Angiographic images of the head were obtained using MRA technique without contrast. CONTRAST:  7.5 milliliters Gadavist COMPARISON:  Head CT without contrast 1347 hours. Brain MRI 08/13/2018. FINDINGS: MRI HEAD  FINDINGS Brain: There is a subtle, punctate area of subcortical white matter increased trace diffusion at the left temporal stem on series 9, image 64 and series 11, image 46. This is not obviously restricted on ADC. No other restricted diffusion. No midline shift, mass effect, evidence of mass lesion, ventriculomegaly, extra-axial collection or acute intracranial hemorrhage. Cervicomedullary junction and pituitary are within normal limits. A small chronic infarct in the left lower cerebellum is stable. Otherwise stable mild for age patchy cerebral white matter T2 and FLAIR hyperintensity. Stable mild involvement of the left lentiform, and also the pons.  No chronic blood products. No abnormal enhancement identified. No dural thickening. Vascular: Major intracranial vascular flow voids are stable. The major dural venous sinuses are enhancing and appear to be patent. Skull and upper cervical spine: Negative visible cervical spine. Normal bone marrow signal. Sinuses/Orbits: Stable. Other: Stable trace mastoid fluid. Visible internal auditory structures appear normal. Normal stylomastoid foramina. Scalp and face soft tissues appear negative. MRA HEAD FINDINGS Antegrade flow in the posterior circulation. Mildly dominant distal left vertebral artery. Patent PICA origins. No distal vertebral stenosis. Patent basilar artery, AICA origins, SCA origins and PCA origins. Posterior communicating arteries are diminutive or absent. Left PCA branches are within normal limits. There is a short segment moderate to severe stenosis of the right PCA P2 (series 1041, image 13) with otherwise normal right PCA branches. Antegrade flow in both ICA siphons. There is moderate bilateral siphon irregularity compatible with atherosclerosis. There is mild to moderate stenosis of the left anterior genu, and similar stenosis of the right supraclinoid segment. Both carotid termini are patent. MCA and ACA origins are patent. Visible bilateral ACA branches are within normal limits. Visible bilateral MCA branches are patent with only mild irregularity. IMPRESSION: 1. Questionable punctate white matter infarct in the left temporal stem (series 11, image 46), although I favor artifact instead. 2. Otherwise no acute intracranial abnormality and stable MRI appearance of the brain since 2019. 3. Intracranial MRA is positive for intracranial atherosclerosis with - moderate stenosis of the bilateral ICA siphons. - moderate to severe stenosis of the right PCA P2 segment. Electronically Signed   By: Odessa Fleming M.D.   On: 03/21/2019 17:13   Mr Laqueta Jean VV Contrast  Result Date: 03/21/2019 CLINICAL DATA:   85 year old female with code stroke, aphasia and memory loss. EXAM: MRI HEAD WITHOUT AND WITH CONTRAST MRA HEAD WITHOUT CONTRAST TECHNIQUE: Multiplanar, multiecho pulse sequences of the brain and surrounding structures were obtained without and with intravenous contrast. Angiographic images of the head were obtained using MRA technique without contrast. CONTRAST:  7.5 milliliters Gadavist COMPARISON:  Head CT without contrast 1347 hours. Brain MRI 08/13/2018. FINDINGS: MRI HEAD FINDINGS Brain: There is a subtle, punctate area of subcortical white matter increased trace diffusion at the left temporal stem on series 9, image 64 and series 11, image 46. This is not obviously restricted on ADC. No other restricted diffusion. No midline shift, mass effect, evidence of mass lesion, ventriculomegaly, extra-axial collection or acute intracranial hemorrhage. Cervicomedullary junction and pituitary are within normal limits. A small chronic infarct in the left lower cerebellum is stable. Otherwise stable mild for age patchy cerebral white matter T2 and FLAIR hyperintensity. Stable mild involvement of the left lentiform, and also the pons. No chronic blood products. No abnormal enhancement identified. No dural thickening. Vascular: Major intracranial vascular flow voids are stable. The major dural venous sinuses are enhancing and appear to be patent. Skull and upper cervical spine: Negative  visible cervical spine. Normal bone marrow signal. Sinuses/Orbits: Stable. Other: Stable trace mastoid fluid. Visible internal auditory structures appear normal. Normal stylomastoid foramina. Scalp and face soft tissues appear negative. MRA HEAD FINDINGS Antegrade flow in the posterior circulation. Mildly dominant distal left vertebral artery. Patent PICA origins. No distal vertebral stenosis. Patent basilar artery, AICA origins, SCA origins and PCA origins. Posterior communicating arteries are diminutive or absent. Left PCA branches are  within normal limits. There is a short segment moderate to severe stenosis of the right PCA P2 (series 1041, image 13) with otherwise normal right PCA branches. Antegrade flow in both ICA siphons. There is moderate bilateral siphon irregularity compatible with atherosclerosis. There is mild to moderate stenosis of the left anterior genu, and similar stenosis of the right supraclinoid segment. Both carotid termini are patent. MCA and ACA origins are patent. Visible bilateral ACA branches are within normal limits. Visible bilateral MCA branches are patent with only mild irregularity. IMPRESSION: 1. Questionable punctate white matter infarct in the left temporal stem (series 11, image 46), although I favor artifact instead. 2. Otherwise no acute intracranial abnormality and stable MRI appearance of the brain since 2019. 3. Intracranial MRA is positive for intracranial atherosclerosis with - moderate stenosis of the bilateral ICA siphons. - moderate to severe stenosis of the right PCA P2 segment. Electronically Signed   By: Genevie Ann M.D.   On: 03/21/2019 17:13   Dg Chest Port 1 View  Result Date: 03/21/2019 CLINICAL DATA:  Altered mental status EXAM: PORTABLE CHEST 1 VIEW COMPARISON:  10/05/2017 chest radiograph. FINDINGS: Stable cardiomediastinal silhouette with normal heart size. No pneumothorax. No pleural effusion. Stable calcified peripheral lower left lung granuloma. No pulmonary edema. No acute consolidative airspace disease. IMPRESSION: No active disease. Electronically Signed   By: Ilona Sorrel M.D.   On: 03/21/2019 15:09   Ct Head Code Stroke Wo Contrast  Result Date: 03/21/2019 CLINICAL DATA:  Code stroke.  Aphasia and memory loss. EXAM: CT HEAD WITHOUT CONTRAST TECHNIQUE: Contiguous axial images were obtained from the base of the skull through the vertex without intravenous contrast. COMPARISON:  08/13/2018 MRI.  09/08/2017 CT FINDINGS: Brain: No sign of acute infarction. Age related atrophy. Chronic  small-vessel ischemic changes of the white matter. No mass lesion, hemorrhage, hydrocephalus or extra-axial collection. Vascular: There is atherosclerotic calcification of the major vessels at the base of the brain. Skull: Negative Sinuses/Orbits: Clear/normal Other: None ASPECTS ([place] Stroke Program Early CT Score) - Ganglionic level infarction (caudate, lentiform nuclei, internal capsule, insula, M1-M3 cortex): 7 - Supraganglionic infarction (M4-M6 cortex): 3 Total score (0-10 with 10 being normal): 10 IMPRESSION: 1. No acute finding. Atrophy and chronic small-vessel ischemic changes. 2. ASPECTS is 10. 3. These results were communicated to Dr. Cheral Marker at [place] 7/4/2020by text page via the [facility] messaging system. Electronically Signed   By: Nelson Chimes M.D.   On: 03/21/2019 13:57    Lurline Del, MD 03/23/2019, 1:15 PM PGY-1, [place] Intern pager: [phone], text pages welcome

## 2019-03-23 NOTE — Progress Notes (Signed)
Physical Therapy Treatment Patient Details Name: Melinda Lewis MRN: [id number] DOB: [birth year] Today's Date: 03/23/2019    History of Present Illness Melinda Lewis is a 85 y.o. female presenting with tia. Patient was having trouble following commands and was not recognize her family.  She has mild baseline dementia. CT head was negative for acute bleed.  MRI and MRA showed question of artifact versus new. PMH is significant for history of TIA, dementia, hyperlipidemia, hypertension, history of NSTEMI, CAD, HFpEF 60-65%, SMA stenosis, aortic atherosclerosis, type 2 diabetes    PT Comments    Patient seen for mobility progression. Pt presents with impaired balance and requires assistance for safe OOB mobility. Pt appears more confused than on initial evaluation and with more unsteady gait. Recommend HHPT for further skilled PT services to maximize independence and safety with mobility.     Follow Up Recommendations  Home health PT;Supervision/Assistance - 24 hour     Equipment Recommendations  None recommended by PT    Recommendations for Other Services       Precautions / Restrictions Precautions Precautions: Fall Restrictions Weight Bearing Restrictions: No    Mobility  Bed Mobility Overal bed mobility: Needs Assistance Bed Mobility: Supine to Sit     Supine to sit: Min guard     General bed mobility comments: min guard for safety  Transfers Overall transfer level: Needs assistance Equipment used: Rolling walker (2 wheeled) Transfers: Sit to/from [employer] Sit to Stand: Min guard         General transfer comment: pt unsteady upon standing and fell back onto bed into sitting after intial stand; min guard for safety   Ambulation/Gait Ambulation/Gait assistance: Min guard;Min assist Gait Distance (Feet): 80 Feet Assistive device: Rolling walker (2 wheeled) Gait Pattern/deviations: Decreased step length - right;Decreased stride length;Drifts  right/left;Narrow base of support Gait velocity: decr   General Gait Details: assist to steady and guide RW; pt running into objects in hallway adn requires cues for navigating environment   Stairs             Wheelchair Mobility    Modified Rankin (Stroke Patients Only) Modified Rankin (Stroke Patients Only) Pre-Morbid Rankin Score: Moderate disability Modified Rankin: Moderately severe disability     Balance Overall balance assessment: Needs assistance   Sitting balance-Leahy Scale: Good     Standing balance support: Bilateral upper extremity supported;During functional activity Standing balance-Leahy Scale: Poor                              Cognition Arousal/Alertness: Awake/alert Behavior During Therapy: WFL for tasks assessed/performed Overall Cognitive Status: No family/caregiver present to determine baseline cognitive functioning                                 General Comments: pt with decreased awareness of safety; pt confused at times and unable to recall what w/c is called      Exercises      General Comments        Pertinent Vitals/Pain Pain Assessment: Faces Faces Pain Scale: Hurts a little bit Pain Location: back (pt reports is chronic) Pain Descriptors / Indicators: Guarding;Sore Pain Intervention(s): Limited activity within patient's tolerance;Monitored during session;Repositioned    Home Living                      Prior Function  PT Goals (current goals can now be found in the care plan section) Acute Rehab PT Goals Patient Stated Goal: return home Progress towards PT goals: Progressing toward goals    Frequency    Min 3X/week      PT Plan Discharge plan needs to be updated    Co-evaluation              AM-PAC PT "6 Clicks" Mobility   Outcome Measure  Help needed turning from your back to your side while in a flat bed without using bedrails?: None Help needed moving  from lying on your back to sitting on the side of a flat bed without using bedrails?: None Help needed moving to and from a bed to a chair (including a wheelchair)?: A Little Help needed standing up from a chair using your arms (e.g., wheelchair or bedside chair)?: A Little Help needed to walk in hospital room?: A Little Help needed climbing 3-5 steps with a railing? : A Little 6 Click Score: 20    End of Session Equipment Utilized During Treatment: Gait belt Activity Tolerance: Patient tolerated treatment well Patient left: in chair;with call bell/phone within reach   PT Visit Diagnosis: Unsteadiness on feet (R26.81)     Time: 0109-3235 PT Time Calculation (min) (ACUTE ONLY): 20 min  Charges:  $Gait Training: 8-22 mins                     Earney Navy, PTA Acute Rehabilitation Services Pager: [phone] Office: [phone]     Darliss Cheney 03/23/2019, 4:27 PM

## 2019-03-23 NOTE — Procedures (Signed)
History: 85 year old female being evaluated for transient episode  Sedation: None  Technique: This is a 21 channel routine scalp EEG performed at the bedside with bipolar and monopolar montages arranged in accordance to the international 10/20 system of electrode placement. One channel was dedicated to EKG recording.    Background: The background consists of intermixed alpha and beta activities. There is a well defined posterior dominant rhythm of 9 hz that attenuates with eye opening. Sleep is recorded with normal appearing structures, there are positive occipital sharp transients of sleep (posts).  Photic stimulation: Physiologic driving is not performed  EEG Abnormalities: 1) NOne  Clinical Interpretation: This normal EEG is recorded in the waking and sleep state. There was no seizure or seizure predisposition recorded on this study. Please note that lack of epileptiform activity on EEG does not preclude the possibility of epilepsy.   Roland Rack, MD [facility] [phone]  If 7pm- 7am, please page neurology on call as listed in [place].

## 2019-03-23 NOTE — Progress Notes (Signed)
STROKE TEAM PROGRESS NOTE    SUBJECTIVE (INTERVAL HISTORY) Her medical intern is at the bedside.   Her EEG was normal.  Echocardiogram was also unremarkable.  She has had some fluctuating confusion and disorientation off-and-on.  I suspect she has baseline mild dementia and is sundowning She continues to have significant hypertension but all her home medications have not yet been started  OBJECTIVE Vitals:   03/23/19 0600 03/23/19 0815 03/23/19 1120 03/23/19 1507  BP: (!) 198/79 (!) 193/102 (!) 235/96 (!) 159/51  Pulse:  75 82 80  Resp:  16 20 16   Temp:  98.1 F (36.7 C) (!) 96.9 F (36.1 C) 98 F (36.7 C)  TempSrc:  Oral Axillary Oral  SpO2:  93% 98% 97%  Weight:      Height:        CBC:  Recent Labs  Lab 03/21/19 1333  03/22/19 0542 03/23/19 0450  WBC 9.9  --  8.4 9.2  NEUTROABS 5.1  --   --   --   HGB 9.5*   < > 8.8* 8.8*  HCT 31.7*   < > 28.9* 29.0*  MCV 79.6*  --  79.4* 80.3  PLT 323  --  287 291   < > = values in this interval not displayed.    Basic Metabolic Panel:  Recent Labs  Lab 03/22/19 0542 03/23/19 0450  NA 140 143  K 3.7 3.6  CL 106 108  CO2 23 23  GLUCOSE 106* 163*  BUN 16 17  CREATININE 1.13* 1.21*  CALCIUM 8.6* 8.5*    Lipid Panel:     Component Value Date/Time   CHOL 107 03/22/2019 0542   TRIG 135 03/22/2019 0542   HDL 33 (L) 03/22/2019 0542   CHOLHDL 3.2 03/22/2019 0542   VLDL 27 03/22/2019 0542   LDLCALC 47 03/22/2019 0542   HgbA1c:  Lab Results  Component Value Date   HGBA1C 7.4 (H) 03/21/2019   Urine Drug Screen: No results found for: LABOPIA, COCAINSCRNUR, LABBENZ, AMPHETMU, THCU, LABBARB  Alcohol Level No results found for: [facility]  IMAGING  Mr Angio Head Wo Contrast Mr Kizzie Fantasia Contrast 03/21/2019 IMPRESSION:  1. Questionable punctate white matter infarct in the left temporal stem (series 11, image 46), although I favor artifact instead.  2. Otherwise no acute intracranial abnormality and stable MRI appearance of the  brain since 2019.  3. Intracranial MRA is positive for intracranial atherosclerosis with - moderate stenosis of the bilateral ICA siphons. - moderate to severe stenosis of the right PCA P2 segment.   Dg Chest Port 1 View 03/21/2019 IMPRESSION:  No active disease.   Ct Head Code Stroke Wo Contrast 03/21/2019 IMPRESSION:  1. No acute finding. Atrophy and chronic small-vessel ischemic changes.  2. ASPECTS is 10.    Transthoracic Echocardiogram  Normal ejection fraction.  No cardiac source of embolism.   EKG - SR rate 87 BPM. (See cardiology reading for complete details)   PHYSICAL EXAM Blood pressure (!) 159/51, pulse 80, temperature 98 F (36.7 C), temperature source Oral, resp. rate 16, height 5\' 2"  (1.575 m), weight 75.8 kg, SpO2 97 %. Pleasant elderly Caucasian lady currently not in distress. . Afebrile. Head is nontraumatic. Neck is supple without bruit.    Cardiac exam no murmur or gallop. Lungs are clear to auscultation. Distal pulses are well felt. Neurological Exam ;  Awake  Alert oriented x 3.  Diminished attention, registration and recall.  Poor clock drawing 2/4.  Unable to copy  intersecting pentagons.  Decrease animal naming.  Normal speech and language.eye movements full without nystagmus.fundi were not visualized. Vision acuity and fields appear normal. Hearing is diminished bilaterally . Palatal movements are normal. Face symmetric. Tongue midline. Normal strength, tone, reflexes and coordination. Normal sensation. Gait deferred.         ASSESSMENT/PLAN [name] is a 85 y.o. female with history of CHF, cardiomyopathy, CAD, DM, HTN, anemia, hypothyroidism, dementia, TIA and HLD presenting with AMS, facial droop and BP 248/98. She did not receive IV t-PA due to non focal exam.  Stroke vs TIA:   Resultant no deficits  CT head -no acute finding  MRI head - questionable punctate white matter infarct in the left temporal stem (series 11, image 46),  although artifact favored instead.   MRA head - moderate stenosis of the bilateral ICA siphons - moderate to severe stenosis of the right PCA P2 segment.   CTA H&N - not performed  Carotid Doppler  - not ordered  2D Echo -normal EF 60 to 65%.    EEG -normal   [employer] Virus 2 - negative  LDL - 47  HgbA1c - 7.4  UDS - not performed  VTE prophylaxis - [place] Heparin  Diet - regular  aspirin 81 mg daily prior to admission, now on aspirin 81 mg daily  Patient counseled to be compliant with her antithrombotic medications  Ongoing aggressive stroke risk factor management  Therapy recommendations:  pending  Disposition:  Pending  Hypertension  Blood pressure somewhat high at times but within post stroke parameters . Permissive hypertension (OK if < 220/120) but gradually normalize in 5-7 days . Long-term BP goal normotensive  Hyperlipidemia  Lipid lowering medication PTA:   Lipitor 40 mg daily  LDL 47, goal < 70  Current lipid lowering medication: Lipitor 80 mg daily  Continue statin at discharge  Diabetes  HgbA1c 7.4, goal < 7.0  Uncontrolled  Other Stroke Risk Factors  Advanced age  Former cigarette smoker - quit  Obesity, Body mass index is 30.56 kg/m., recommend weight loss, diet and exercise as appropriate   Family hx stroke - not on file  Hx of TIA 2010  Coronary artery disease   Other Active Problems  Anemia - Hb - 9.5->11.2->8.8  Creatinine - 1.12->1.10->1.13 Moderate stenosis of the bilateral ICA siphons by MRA head -likely asymptomatic  Hospital day # 0  I  She presented with transient confusion in the setting of significantly elevated blood pressure which now appears to have improved.  MRI scan does show a tiny punctate left temporal white matter weekly diffusion positive hyperintensity which is of questionable significance and cannot explain her presentation.  This could be a silent subacute lacunar infarct versus ictal MRI  finding.. The patient's confusion appears to be fluctuating and this may likely be due to underlying mild dementia with sundowning.  Her uncontrolled hypertension is also not helping.  Recommend reintroduce her home medications and arrange for closer supervision about her medication intake after discharge by family.  Stroke team will sign off.  Kindly call for questions.    Delia Heady, MD Medical Director [facility] Pager: [phone] 03/23/2019 5:01 PM   To contact Stroke Continuity provider, please refer to [email or web address]. After hours, contact General Neurology

## 2019-03-24 ENCOUNTER — Inpatient Hospital Stay (HOSPITAL_COMMUNITY): Payer: PPO

## 2019-03-24 DIAGNOSIS — R410 Disorientation, unspecified: Secondary | ICD-10-CM

## 2019-03-24 DIAGNOSIS — R4702 Dysphasia: Secondary | ICD-10-CM

## 2019-03-24 LAB — CBC
HCT: 28.9 % — ABNORMAL LOW (ref 36.0–46.0)
Hemoglobin: 8.8 g/dL — ABNORMAL LOW (ref 12.0–15.0)
MCH: 24.2 pg — ABNORMAL LOW (ref 26.0–34.0)
MCHC: 30.4 g/dL (ref 30.0–36.0)
MCV: 79.6 fL — ABNORMAL LOW (ref 80.0–100.0)
Platelets: 301 10*3/uL (ref 150–400)
RBC: 3.63 MIL/uL — ABNORMAL LOW (ref 3.87–5.11)
RDW: 16.4 % — ABNORMAL HIGH (ref 11.5–15.5)
WBC: 9.1 10*3/uL (ref 4.0–10.5)
nRBC: 0 % (ref 0.0–0.2)

## 2019-03-24 LAB — GLUCOSE, CAPILLARY
Glucose-Capillary: 141 mg/dL — ABNORMAL HIGH (ref 70–99)
Glucose-Capillary: 125 mg/dL — ABNORMAL HIGH (ref 70–99)
Glucose-Capillary: 130 mg/dL — ABNORMAL HIGH (ref 70–99)
Glucose-Capillary: 146 mg/dL — ABNORMAL HIGH (ref 70–99)
Glucose-Capillary: 476 mg/dL — ABNORMAL HIGH (ref 70–99)
Glucose-Capillary: 151 mg/dL — ABNORMAL HIGH (ref 70–99)

## 2019-03-24 LAB — BASIC METABOLIC PANEL
Anion gap: 9 (ref 5–15)
BUN: 14 mg/dL (ref 8–23)
CO2: 23 mmol/L (ref 22–32)
Calcium: 8.3 mg/dL — ABNORMAL LOW (ref 8.9–10.3)
Chloride: 105 mmol/L (ref 98–111)
Creatinine, Ser: 0.93 mg/dL (ref 0.44–1.00)
GFR calc Af Amer: 59 mL/min — ABNORMAL LOW (ref >60)
GFR calc non Af Amer: 51 mL/min — ABNORMAL LOW (ref >60)
Glucose, Bld: 131 mg/dL — ABNORMAL HIGH (ref 70–99)
Potassium: 3.7 mmol/L (ref 3.5–5.1)
Sodium: 137 mmol/L (ref 135–145)

## 2019-03-24 MED ORDER — LEVETIRACETAM 500 MG PO TABS
500.0000 mg | ORAL_TABLET | Freq: Two times a day (BID) | ORAL | Status: DC
  Administered 2019-03-24 – 2019-03-25 (×2): 500 mg via ORAL
  Filled 2019-03-24 (×2): qty 1

## 2019-03-24 MED ORDER — HYDRALAZINE HCL 20 MG/ML IJ SOLN
5.0000 mg | Freq: Three times a day (TID) | INTRAMUSCULAR | Status: DC
  Administered 2019-03-24 (×2): 5 mg via INTRAVENOUS
  Filled 2019-03-24 (×2): qty 1

## 2019-03-24 MED ORDER — HYDRALAZINE HCL 50 MG PO TABS: 50.0000 mg | ORAL_TABLET | Freq: Three times a day (TID) | ORAL | 0 refills | Status: DC

## 2019-03-24 MED ORDER — POLYETHYLENE GLYCOL 3350 17 G PO PACK: 17.0000 g | PACK | Freq: Every day | ORAL | 0 refills | Status: DC | PRN

## 2019-03-24 MED ORDER — FERROUS SULFATE 325 (65 FE) MG PO TABS: 325.0000 mg | ORAL_TABLET | Freq: Every day | ORAL | 0 refills | Status: DC

## 2019-03-24 MED ORDER — ATORVASTATIN CALCIUM 80 MG PO TABS: 80.0000 mg | ORAL_TABLET | Freq: Every day | ORAL | 0 refills | Status: DC

## 2019-03-24 MED ORDER — POLYETHYLENE GLYCOL 3350 17 G PO PACK: 17.0000 g | PACK | Freq: Every day | ORAL | Status: DC | PRN

## 2019-03-24 MED ORDER — FERROUS SULFATE 325 (65 FE) MG PO TABS
325.0000 mg | ORAL_TABLET | Freq: Every day | ORAL | Status: DC
  Administered 2019-03-24 – 2019-03-27 (×4): 325 mg via ORAL
  Filled 2019-03-24 (×4): qty 1

## 2019-03-24 MED ORDER — WHITE PETROLATUM EX OINT
TOPICAL_OINTMENT | CUTANEOUS | Status: AC
  Administered 2019-03-24: 22:00:00
  Filled 2019-03-24: qty 28.35

## 2019-03-24 NOTE — Discharge Instructions (Addendum)
Discharge instructions:  We are suggesting some changes: There was a question of seizure but we were unable to identify one on the EEG tests we did.  As part of that concern Melinda Lewis was started on a medicine called keppra.  The blood cultures we performed have shown no infection for the first 2 days of the culture, we will follow and let you know if that changes.  Iron each morning at breakfast. This can help with blood levels but can cause some constipation so we are adding miralax with it to use when constipated.  Melinda Lewis has hypertension and I would recommend following up with her hospice team to make sure she is comfortable on her medication plan.  We were told on admission that she has not been taking her benzo so we didn't not continue it.  Please return if any chest pain, stroke-like symptoms, shortness of breath, blood in stool, or jet black stools.

## 2019-03-24 NOTE — Procedures (Signed)
History: 85 year old female being evaluated for transient episodes of aphasia  Sedation: None  Technique: This is a 21 channel routine scalp EEG performed at the bedside with bipolar and monopolar montages arranged in accordance to the international 10/20 system of electrode placement. One channel was dedicated to EKG recording.    Background: The background is obscured throughout most of the recording by muscle activity.  During periods of relative quiescence, there is a posterior dominant rhythm of 9 Hz which is relatively well sustained.  No epileptiform activity was seen.  Photic stimulation: Physiologic driving is not performed  EEG Abnormalities: None  Clinical Interpretation: This EEG was significantly limited by muscle activity throughout most of the recording, but no clear seizure or epileptiform activity were seen during the brief periods of relative quiescence.  Please note that lack of epileptiform activity on EEG does not preclude the possibility of epilepsy.   Roland Rack, MD [facility] [phone]  If 7pm- 7am, please page neurology on call as listed in [place].

## 2019-03-24 NOTE — Progress Notes (Signed)
Physical Therapy Treatment Patient Details Name: Melinda Lewis MRN: [id number] DOB: 03/17/1918 Today's Date: 03/24/2019    History of Present Illness Melinda Lewis is a 85 y.o. female presenting with tia. Patient was having trouble following commands and was not recognize her family.  She has mild baseline dementia. CT head was negative for acute bleed.  MRI and MRA showed question of artifact versus new. PMH is significant for history of TIA, dementia, hyperlipidemia, hypertension, history of NSTEMI, CAD, HFpEF 60-65%, SMA stenosis, aortic atherosclerosis, type 2 diabetes    PT Comments    Patient seen for mobility progression. Current plan remains appropriate.    Follow Up Recommendations  Home health PT;Supervision/Assistance - 24 hour     Equipment Recommendations  None recommended by PT    Recommendations for Other Services       Precautions / Restrictions Precautions Precautions: Fall Restrictions Weight Bearing Restrictions: No    Mobility  Bed Mobility               General bed mobility comments: pt OOB in chair upon arrival  Transfers Overall transfer level: Needs assistance Equipment used: Rolling walker (2 wheeled) Transfers: Sit to/from Stand Sit to Stand: Min guard;Min assist         General transfer comment: min A for eccentric loading/safety when returing to seated position; cues for safety   Ambulation/Gait Ambulation/Gait assistance: Min guard(close supervision) Gait Distance (Feet): 100 Feet Assistive device: Rolling walker (2 wheeled) Gait Pattern/deviations: Decreased stride length;Narrow base of support;Step-through pattern Gait velocity: decr   General Gait Details: min guard assist especially with turning/directional changes; min directional cues needed in hallway   Stairs             Wheelchair Mobility    Modified Rankin (Stroke Patients Only) Modified Rankin (Stroke Patients Only) Pre-Morbid Rankin Score: Moderate  disability Modified Rankin: Moderately severe disability     Balance Overall balance assessment: Needs assistance   Sitting balance-Leahy Scale: Good     Standing balance support: Bilateral upper extremity supported;During functional activity Standing balance-Leahy Scale: Poor                              Cognition Arousal/Alertness: Awake/alert Behavior During Therapy: WFL for tasks assessed/performed Overall Cognitive Status: No family/caregiver present to determine baseline cognitive functioning                                 General Comments: some decreased safety awareness noted especially with transfers      Exercises      General Comments        Pertinent Vitals/Pain Pain Assessment: Faces Faces Pain Scale: Hurts a little bit Pain Location: back (pt reports is chronic) Pain Descriptors / Indicators: Guarding;Sore Pain Intervention(s): Limited activity within patient's tolerance;Monitored during session;Repositioned    Home Living                      Prior Function            PT Goals (current goals can now be found in the care plan section) Acute Rehab PT Goals Patient Stated Goal: return home Progress towards PT goals: Progressing toward goals    Frequency    Min 3X/week      PT Plan Current plan remains appropriate    Co-evaluation  AM-PAC PT "6 Clicks" Mobility   Outcome Measure  Help needed turning from your back to your side while in a flat bed without using bedrails?: None Help needed moving from lying on your back to sitting on the side of a flat bed without using bedrails?: None Help needed moving to and from a bed to a chair (including a wheelchair)?: A Little Help needed standing up from a chair using your arms (e.g., wheelchair or bedside chair)?: None Help needed to walk in hospital room?: A Little Help needed climbing 3-5 steps with a railing? : A Little 6 Click Score:  21    End of Session Equipment Utilized During Treatment: Gait belt Activity Tolerance: Patient tolerated treatment well Patient left: in chair;with call bell/phone within reach;with chair alarm set Nurse Communication: Mobility status PT Visit Diagnosis: Unsteadiness on feet (R26.81)     Time: 9758-8325 PT Time Calculation (min) (ACUTE ONLY): 19 min  Charges:  $Gait Training: 8-22 mins                     Earney Navy, PTA Acute Rehabilitation Services Pager: [phone] Office: [phone]     Darliss Cheney 03/24/2019, 1:38 PM

## 2019-03-24 NOTE — Progress Notes (Addendum)
MD Leonie Man notified about patients sudden slurred speech and some expressive aphasia. Nurse will continue to monitor. No new orders given.

## 2019-03-24 NOTE — Progress Notes (Signed)
Family Medicine Teaching Service Daily Progress Note Intern Pager: [phone]  Patient name: Melinda Lewis Medical record number: [id number] Date of birth: [birth year] Age: 85 y.o. Gender: female  Primary Care Provider: Marguarite Arbour, MD Consultants: Neurology Code Status: DNR  Pt Overview and Major Events to Date:  7/4 Patient admitted with tia 7/4 MR Brain 7/4 MRA head  Assessment and Plan:  TIA with hx of prior TIAs - CT head negative for acute bleed -Neurology following, appreciate recommendations.  - MRI and MRA - question of new infarct vs artifact  - Patient passed nurse bedside swallow test  - Echo   - Carotid ultrasound  - HTN corrected above 180 systolic per neurology  - 81mg  ASA per day  - Atorvastatin increased from home dose of 40mg >80mg   Hypertension: - BP this a.m. (7/6) - 193/102 - Plan for permissive HTN to 180 systolic per neurology - Continue patients Metoprolol and Imdur to prevent rebound angina - Lisinopril at reduced dose of 10mg   Hypothyroidism - Home meds - Synthroid 150 - Current TSH is 0.445 - Continue home synthroid.  Mild Anemia - Hgb 8.8 this morning 7/7 - Continue monitoring   Insomnia - Cont. Home meds - trazodone and melatonin  FEN/GI: Regular diet PPx: heparin  Disposition: Likely home pending TIA workup  Subjective:  Patient was continuing to improve today and plan was to discharge patient. In the afternoon nursing staff alerted Dr. Pearlean Brownie and myself that the patient was acting confused and not speaking appropriately. Patient's discharge was postponed pending further workup.   Objective: Temp:  [97.8 F (36.6 C)-98.6 F (37 C)] 98.6 F (37 C) (07/07 1346) Pulse Rate:  [71-96] 85 (07/07 1346) Resp:  [15-20] 15 (07/07 1136) BP: (157-202)/(53-75) 198/64 (07/07 1346) SpO2:  [94 %-99 %] 99 % (07/07 1346) Physical Exam: General: Patient confused and speaking in a mixture of appropriate sentences and sentences with words  mixed inappropriately.  Heart: Tachy but regular rhythm  Lungs: CTA bilaterally, no wheezing Abdomen: Bowel sounds present Skin: Warm and dry Extremities: No lower extremity edema   Laboratory: Recent Labs  Lab 03/22/19 0542 03/23/19 0450 03/24/19 0730  WBC 8.4 9.2 9.1  HGB 8.8* 8.8* 8.8*  HCT 28.9* 29.0* 28.9*  PLT 287 291 301   Recent Labs  Lab 03/21/19 1333  03/22/19 0542 03/23/19 0450 03/24/19 0730  NA 136   < > 140 143 137  K 4.4   < > 3.7 3.6 3.7  CL 101   < > 106 108 105  CO2 22  --  23 23 23   BUN 18   < > 16 17 14   CREATININE 1.12*   < > 1.13* 1.21* 0.93  CALCIUM 8.8*  --  8.6* 8.5* 8.3*  PROT 7.0  --   --   --   --   BILITOT 0.6  --   --   --   --   ALKPHOS 52  --   --   --   --   ALT 22  --   --   --   --   AST 24  --   --   --   --   GLUCOSE 121*   < > 106* 163* 131*   < > = values in this interval not displayed.     Imaging/Diagnostic Tests: Mr Angio Head Wo Contrast  Result Date: 03/21/2019 CLINICAL DATA:  85 year old female with code stroke, aphasia and memory loss. EXAM: MRI HEAD WITHOUT  AND WITH CONTRAST MRA HEAD WITHOUT CONTRAST TECHNIQUE: Multiplanar, multiecho pulse sequences of the brain and surrounding structures were obtained without and with intravenous contrast. Angiographic images of the head were obtained using MRA technique without contrast. CONTRAST:  7.5 milliliters Gadavist COMPARISON:  Head CT without contrast 1347 hours. Brain MRI 08/13/2018. FINDINGS: MRI HEAD FINDINGS Brain: There is a subtle, punctate area of subcortical white matter increased trace diffusion at the left temporal stem on series 9, image 64 and series 11, image 46. This is not obviously restricted on ADC. No other restricted diffusion. No midline shift, mass effect, evidence of mass lesion, ventriculomegaly, extra-axial collection or acute intracranial hemorrhage. Cervicomedullary junction and pituitary are within normal limits. A small chronic infarct in the left lower  cerebellum is stable. Otherwise stable mild for age patchy cerebral white matter T2 and FLAIR hyperintensity. Stable mild involvement of the left lentiform, and also the pons. No chronic blood products. No abnormal enhancement identified. No dural thickening. Vascular: Major intracranial vascular flow voids are stable. The major dural venous sinuses are enhancing and appear to be patent. Skull and upper cervical spine: Negative visible cervical spine. Normal bone marrow signal. Sinuses/Orbits: Stable. Other: Stable trace mastoid fluid. Visible internal auditory structures appear normal. Normal stylomastoid foramina. Scalp and face soft tissues appear negative. MRA HEAD FINDINGS Antegrade flow in the posterior circulation. Mildly dominant distal left vertebral artery. Patent PICA origins. No distal vertebral stenosis. Patent basilar artery, AICA origins, SCA origins and PCA origins. Posterior communicating arteries are diminutive or absent. Left PCA branches are within normal limits. There is a short segment moderate to severe stenosis of the right PCA P2 (series 1041, image 13) with otherwise normal right PCA branches. Antegrade flow in both ICA siphons. There is moderate bilateral siphon irregularity compatible with atherosclerosis. There is mild to moderate stenosis of the left anterior genu, and similar stenosis of the right supraclinoid segment. Both carotid termini are patent. MCA and ACA origins are patent. Visible bilateral ACA branches are within normal limits. Visible bilateral MCA branches are patent with only mild irregularity. IMPRESSION: 1. Questionable punctate white matter infarct in the left temporal stem (series 11, image 46), although I favor artifact instead. 2. Otherwise no acute intracranial abnormality and stable MRI appearance of the brain since 2019. 3. Intracranial MRA is positive for intracranial atherosclerosis with - moderate stenosis of the bilateral ICA siphons. - moderate to severe  stenosis of the right PCA P2 segment. Electronically Signed   By: Odessa FlemingH  Hall M.D.   On: 03/21/2019 17:13   Mr Laqueta JeanBrain W ZOWo Contrast  Result Date: 03/21/2019 CLINICAL DATA:  85 year old female with code stroke, aphasia and memory loss. EXAM: MRI HEAD WITHOUT AND WITH CONTRAST MRA HEAD WITHOUT CONTRAST TECHNIQUE: Multiplanar, multiecho pulse sequences of the brain and surrounding structures were obtained without and with intravenous contrast. Angiographic images of the head were obtained using MRA technique without contrast. CONTRAST:  7.5 milliliters Gadavist COMPARISON:  Head CT without contrast 1347 hours. Brain MRI 08/13/2018. FINDINGS: MRI HEAD FINDINGS Brain: There is a subtle, punctate area of subcortical white matter increased trace diffusion at the left temporal stem on series 9, image 64 and series 11, image 46. This is not obviously restricted on ADC. No other restricted diffusion. No midline shift, mass effect, evidence of mass lesion, ventriculomegaly, extra-axial collection or acute intracranial hemorrhage. Cervicomedullary junction and pituitary are within normal limits. A small chronic infarct in the left lower cerebellum is stable. Otherwise stable mild for age  patchy cerebral white matter T2 and FLAIR hyperintensity. Stable mild involvement of the left lentiform, and also the pons. No chronic blood products. No abnormal enhancement identified. No dural thickening. Vascular: Major intracranial vascular flow voids are stable. The major dural venous sinuses are enhancing and appear to be patent. Skull and upper cervical spine: Negative visible cervical spine. Normal bone marrow signal. Sinuses/Orbits: Stable. Other: Stable trace mastoid fluid. Visible internal auditory structures appear normal. Normal stylomastoid foramina. Scalp and face soft tissues appear negative. MRA HEAD FINDINGS Antegrade flow in the posterior circulation. Mildly dominant distal left vertebral artery. Patent PICA origins. No distal  vertebral stenosis. Patent basilar artery, AICA origins, SCA origins and PCA origins. Posterior communicating arteries are diminutive or absent. Left PCA branches are within normal limits. There is a short segment moderate to severe stenosis of the right PCA P2 (series 1041, image 13) with otherwise normal right PCA branches. Antegrade flow in both ICA siphons. There is moderate bilateral siphon irregularity compatible with atherosclerosis. There is mild to moderate stenosis of the left anterior genu, and similar stenosis of the right supraclinoid segment. Both carotid termini are patent. MCA and ACA origins are patent. Visible bilateral ACA branches are within normal limits. Visible bilateral MCA branches are patent with only mild irregularity. IMPRESSION: 1. Questionable punctate white matter infarct in the left temporal stem (series 11, image 46), although I favor artifact instead. 2. Otherwise no acute intracranial abnormality and stable MRI appearance of the brain since 2019. 3. Intracranial MRA is positive for intracranial atherosclerosis with - moderate stenosis of the bilateral ICA siphons. - moderate to severe stenosis of the right PCA P2 segment. Electronically Signed   By: Genevie Ann M.D.   On: 03/21/2019 17:13   Dg Chest Port 1 View  Result Date: 03/21/2019 CLINICAL DATA:  Altered mental status EXAM: PORTABLE CHEST 1 VIEW COMPARISON:  10/05/2017 chest radiograph. FINDINGS: Stable cardiomediastinal silhouette with normal heart size. No pneumothorax. No pleural effusion. Stable calcified peripheral lower left lung granuloma. No pulmonary edema. No acute consolidative airspace disease. IMPRESSION: No active disease. Electronically Signed   By: Ilona Sorrel M.D.   On: 03/21/2019 15:09   Ct Head Code Stroke Wo Contrast  Result Date: 03/21/2019 CLINICAL DATA:  Code stroke.  Aphasia and memory loss. EXAM: CT HEAD WITHOUT CONTRAST TECHNIQUE: Contiguous axial images were obtained from the base of the skull  through the vertex without intravenous contrast. COMPARISON:  08/13/2018 MRI.  09/08/2017 CT FINDINGS: Brain: No sign of acute infarction. Age related atrophy. Chronic small-vessel ischemic changes of the white matter. No mass lesion, hemorrhage, hydrocephalus or extra-axial collection. Vascular: There is atherosclerotic calcification of the major vessels at the base of the brain. Skull: Negative Sinuses/Orbits: Clear/normal Other: None ASPECTS ([place] Stroke Program Early CT Score) - Ganglionic level infarction (caudate, lentiform nuclei, internal capsule, insula, M1-M3 cortex): 7 - Supraganglionic infarction (M4-M6 cortex): 3 Total score (0-10 with 10 being normal): 10 IMPRESSION: 1. No acute finding. Atrophy and chronic small-vessel ischemic changes. 2. ASPECTS is 10. 3. These results were communicated to Dr. Cheral Marker at [place] 7/4/2020by text page via the [facility] messaging system. Electronically Signed   By: Nelson Chimes M.D.   On: 03/21/2019 13:57    Lurline Del, MD 03/24/2019, 3:26 PM PGY-1, [place] Intern pager: [phone], text pages welcome

## 2019-03-24 NOTE — Progress Notes (Signed)
STROKE TEAM PROGRESS NOTE    SUBJECTIVE (INTERVAL HISTORY) I was called by the patient's nurse to evaluate her as she was about to be discharged home when she thought that she is confused and having some intermittent aphasia and speech difficulties.  And I came and evaluated her patient was sitting up she appeared awake alert.  She was disoriented and confused.  She had some intermittent garbled speech and word salad but at times could speak clearly but she was not communicating very well.  There was some intermittent tremors in the right foot mainly but not on the face of the hand.  She had previously had an EEG on 03/21/2019 which was normal.  Her blood pressure is  better controlled now and it is running in the 170-198 systolic range OBJECTIVE Vitals:   03/24/19 0833 03/24/19 1136 03/24/19 1346 03/24/19 1654  BP: (!) 179/53 (!) 177/64 (!) 198/64 (!) 149/99  Pulse: 96 71 85 89  Resp: 20 15  18   Temp: 98 F (36.7 C) 97.8 F (36.6 C) 98.6 F (37 C) 98.9 F (37.2 C)  TempSrc: Oral Oral Oral Oral  SpO2: 97% 98% 99% 97%  Weight:      Height:        CBC:  Recent Labs  Lab 03/21/19 1333  03/23/19 0450 03/24/19 0730  WBC 9.9   < > 9.2 9.1  NEUTROABS 5.1  --   --   --   HGB 9.5*   < > 8.8* 8.8*  HCT 31.7*   < > 29.0* 28.9*  MCV 79.6*   < > 80.3 79.6*  PLT 323   < > 291 301   < > = values in this interval not displayed.    Basic Metabolic Panel:  Recent Labs  Lab 03/23/19 0450 03/24/19 0730  NA 143 137  K 3.6 3.7  CL 108 105  CO2 23 23  GLUCOSE 163* 131*  BUN 17 14  CREATININE 1.21* 0.93  CALCIUM 8.5* 8.3*    Lipid Panel:     Component Value Date/Time   CHOL 107 03/22/2019 0542   TRIG 135 03/22/2019 0542   HDL 33 (L) 03/22/2019 0542   CHOLHDL 3.2 03/22/2019 0542   VLDL 27 03/22/2019 0542   LDLCALC 47 03/22/2019 0542   HgbA1c:  Lab Results  Component Value Date   HGBA1C 7.4 (H) 03/21/2019   Urine Drug Screen: No results found for: LABOPIA, COCAINSCRNUR,  LABBENZ, AMPHETMU, THCU, LABBARB  Alcohol Level No results found for: [facility]  IMAGING  Mr Angio Head Wo Contrast Mr Lodema Pilot Contrast 03/21/2019 IMPRESSION:  1. Questionable punctate white matter infarct in the left temporal stem (series 11, image 46), although I favor artifact instead.  2. Otherwise no acute intracranial abnormality and stable MRI appearance of the brain since 2019.  3. Intracranial MRA is positive for intracranial atherosclerosis with - moderate stenosis of the bilateral ICA siphons. - moderate to severe stenosis of the right PCA P2 segment.   Dg Chest Port 1 View 03/21/2019 IMPRESSION:  No active disease.   Ct Head Code Stroke Wo Contrast 03/21/2019 IMPRESSION:  1. No acute finding. Atrophy and chronic small-vessel ischemic changes.  2. ASPECTS is 10.    Transthoracic Echocardiogram  Normal ejection fraction.  No cardiac source of embolism.   EKG - SR rate 87 BPM. (See cardiology reading for complete details)   PHYSICAL EXAM Blood pressure (!) 149/99, pulse 89, temperature 98.9 F (37.2 C), temperature source Oral, resp.  rate 18, height 5\' 2"  (1.575 m), weight 75.8 kg, SpO2 97 %. Pleasant elderly Caucasian lady currently not in distress. . Afebrile. Head is nontraumatic. Neck is supple without bruit.    Cardiac exam no murmur or gallop. Lungs are clear to auscultation. Distal pulses are well felt. Neurological Exam ;  Awake  Alert oriented x2  Diminished attention, registration and recall.  Patient has mixed aphasia with at times fluent speech and at times word salad and nonsensical words.  She can speak short sentences and a few words clearly.  She can follow only simple midline and one-step commands.  eye movements full without nystagmus.fundi were not visualized. Vision acuity and fields appear normal. Hearing is diminished bilaterally . Palatal movements are normal. Face symmetric. Tongue midline. Normal strength, tone, reflexes and coordination. Normal  sensation. Gait deferred.         ASSESSMENT/PLAN Melinda Lewis is a 85 y.o. female with history of CHF, cardiomyopathy, CAD, DM, HTN, anemia, hypothyroidism, dementia, TIA and HLD presenting with AMS, facial droop and BP 248/98. She did not receive IV t-PA due to non focal exam.  Stroke vs TIA: Negative neuroimaging previously.  No recurrent symptoms of transient speech difficulties and confusion possible complex partial seizures.  Resultant no deficits  CT head -no acute finding  MRI head - questionable punctate white matter infarct in the left temporal stem (series 11, image 46), although artifact favored instead.   MRA head - moderate stenosis of the bilateral ICA siphons - moderate to severe stenosis of the right PCA P2 segment.   CTA H&N - not performed  Carotid Doppler  - not ordered  2D Echo -normal EF 60 to 65%.    EEG -normal   [employer] Corona Virus 2 - negative  LDL - 47  HgbA1c - 7.4  UDS - not performed  VTE prophylaxis -  Bend Heparin  Diet - regular  aspirin 81 mg daily prior to admission, now on aspirin 81 mg daily  Patient counseled to be compliant with her antithrombotic medications  Ongoing aggressive stroke risk factor management  Therapy recommendations:  pending  Disposition:  Pending  Hypertension  Blood pressure somewhat high at times but within post stroke parameters . Permissive hypertension (OK if < 220/120) but gradually normalize in 5-7 days . Long-term BP goal normotensive  Hyperlipidemia  Lipid lowering medication PTA:   Lipitor 40 mg daily  LDL 47, goal < 70  Current lipid lowering medication: Lipitor 80 mg daily  Continue statin at discharge  Diabetes  HgbA1c 7.4, goal < 7.0  Uncontrolled  Other Stroke Risk Factors  Advanced age  Former cigarette smoker - quit  Obesity, Body mass index is 30.56 kg/m., recommend weight loss, diet and exercise as appropriate   Family hx stroke - not on file  Hx of TIA  2010  Coronary artery disease   Other Active Problems  Anemia - Hb - 9.5->11.2->8.8  Creatinine - 1.12->1.10->1.13 Moderate stenosis of the bilateral ICA siphons by MRA head -likely asymptomatic  Hospital day # 1  I  She presented with transient confusion in the setting of significantly elevated blood pressure which now appeared to have improved.  MRI scan does show a tiny punctate left temporal white matter weekly diffusion positive hyperintensity which is of questionable significance and cannot explain her presentation.  This could be a silent subacute lacunar infarct versus ictal MRI finding.. The patient's confusion appears to be fluctuating and this may likely be due to underlying  mild dementia with sundowning.  Today her confusion and speech difficulties are worse despite better blood pressure control.  Despite a normal EEG I would recommend a trial of Keppra 500 twice daily and repeat CT scan of the head and EEG.  I recommend canceling discharge plan for today.  Discussed with patient, bedside RN and Dr. Angela Adam.  Greater than 50% time during this 35-minute visit was spent on counseling and coordination of care about her recurrent episodes of confusion and speech difficulties and discussion about differential diagnosis and evaluation and treatment plan.   Antony Contras, MD Medical Director [place] Pager: [phone] 03/24/2019 5:51 PM   To contact Stroke Continuity provider, please refer to [email or web address] After hours, contact General Neurology

## 2019-03-24 NOTE — TOC Initial Note (Signed)
Transition of Care [facility]) - Initial/Assessment Note    Patient Details  Name: Melinda Lewis MRN: [id number] Date of Birth: 05-24-1918  Transition of Care [facility]) CM/SW Contact:    Pollie Friar, RN Phone Number: 03/24/2019, 4:17 PM  Clinical Narrative:                 CM met with the patient and then spoke to the patients daughter over the phone (with pt permission). Daughter states the patient lives with her sister who does her medications. Family provides needed transportation. HH choice provided to daughter. Dorian Pod with [facility] accepted the referral.  Family to provide transport home when ready for d/c.  Expected Discharge Plan: [place] Barriers to Discharge: Continued Medical Work up   Patient Goals and CMS Choice   CMS Medicare.gov Compare Post Acute Care list provided to:: Patient Represenative (must comment)(daughter) Choice offered to / list presented to : Adult Children  Expected Discharge Plan and Services Expected Discharge Plan: [place]   Discharge Planning Services: CM Consult Post Acute Care Choice: [place] arrangements for the past 2 months: Single Family Home Expected Discharge Date: 03/24/19                         HH Arranged: PT HH Agency[facility] Date [place]: 03/24/19   Representative spoke with at [place]: Dorian Pod  Prior Living Arrangements/Services Living arrangements for the past 2 months: [place] Lives with:: Siblings(sister) Patient language and need for interpreter reviewed:: Yes(no needs) Do you feel safe going back to the place where you live?: Yes      Need for Family Participation in Patient Care: Yes (Comment) Care giver support system in place?: Yes (comment)(has caregiver support at home.) Current home services: DME(walker, shower seat) Criminal Activity/Legal Involvement Pertinent to Current Situation/Hospitalization: No - Comment as  needed  Activities of Daily Living Home Assistive Devices/Equipment: Gilford Rile (specify type) ADL Screening (condition at time of admission) Patient's cognitive ability adequate to safely complete daily activities?: Yes Is the patient deaf or have difficulty hearing?: Yes Does the patient have difficulty seeing, even when wearing glasses/contacts?: No Does the patient have difficulty concentrating, remembering, or making decisions?: No Patient able to express need for assistance with ADLs?: Yes Does the patient have difficulty dressing or bathing?: No Independently performs ADLs?: Yes (appropriate for developmental age) Does the patient have difficulty walking or climbing stairs?: No Weakness of Legs: None Weakness of Arms/Hands: None  Permission Sought/Granted                  Emotional Assessment Appearance:: Appears stated age   Affect (typically observed): Accepting, Pleasant Orientation: : Oriented to Self, Oriented to Place   Psych Involvement: No (comment)  Admission diagnosis:  code stroke Patient Active Problem List   Diagnosis Date Noted  . Altered mental status   . TIA (transient ischemic attack) 03/21/2019  . AKI (acute kidney injury) ([place]) 09/05/2018  . Diabetes mellitus without complication ([place]) 50/27/7412  . 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 Aortic atherosclerosis ([place]) 10/22/2017  . Carotid disease, bilateral ([place]) 10/22/2017  . Uncontrolled hypertension 09/09/2017  . Nausea & vomiting 09/08/2017  . Chronic systolic CHF (congestive heart failure) ([place]) 03/04/2015  . Acute on chronic systolic CHF (congestive heart failure) ([place]) 06/16/2014  . Cardiomyopathy, ischemic 06/16/2014  . CAD, multiple vessel 06/15/2014  . Anemia 05/05/2014  .  Hypothyroidism 05/05/2014  . 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/05/2014  . NSTEMI (non-ST elevated myocardial infarction) ([place]) 05/02/2014  . Hypertension 05/02/2014  . Pulmonary edema cardiac cause ([place])  05/02/2014   PCP:  Idelle Crouch, MD Pharmacy:   [place] [place] [address] [address] Phone: [phone] Fax: [phone]     Social Determinants of Health (SDOH) Interventions    Readmission Risk Interventions No flowsheet data found.

## 2019-03-24 NOTE — Progress Notes (Signed)
EEG complete - results pending 

## 2019-03-25 ENCOUNTER — Inpatient Hospital Stay (HOSPITAL_COMMUNITY): Payer: PPO

## 2019-03-25 DIAGNOSIS — R410 Disorientation, unspecified: Secondary | ICD-10-CM | POA: Diagnosis not present

## 2019-03-25 DIAGNOSIS — R4702 Dysphasia: Secondary | ICD-10-CM

## 2019-03-25 DIAGNOSIS — F0390 Unspecified dementia without behavioral disturbance: Secondary | ICD-10-CM

## 2019-03-25 DIAGNOSIS — Z7189 Other specified counseling: Secondary | ICD-10-CM

## 2019-03-25 DIAGNOSIS — Z515 Encounter for palliative care: Secondary | ICD-10-CM

## 2019-03-25 LAB — BASIC METABOLIC PANEL
Anion gap: 12 (ref 5–15)
BUN: 13 mg/dL (ref 8–23)
CO2: 19 mmol/L — ABNORMAL LOW (ref 22–32)
Calcium: 8.2 mg/dL — ABNORMAL LOW (ref 8.9–10.3)
Chloride: 103 mmol/L (ref 98–111)
Creatinine, Ser: 0.99 mg/dL (ref 0.44–1.00)
GFR calc Af Amer: 55 mL/min — ABNORMAL LOW (ref >60)
GFR calc non Af Amer: 47 mL/min — ABNORMAL LOW (ref >60)
Glucose, Bld: 118 mg/dL — ABNORMAL HIGH (ref 70–99)
Potassium: 3.1 mmol/L — ABNORMAL LOW (ref 3.5–5.1)
Sodium: 134 mmol/L — ABNORMAL LOW (ref 135–145)

## 2019-03-25 LAB — CBC
HCT: 31.4 % — ABNORMAL LOW (ref 36.0–46.0)
Hemoglobin: 10 g/dL — ABNORMAL LOW (ref 12.0–15.0)
MCH: 24.4 pg — ABNORMAL LOW (ref 26.0–34.0)
MCHC: 31.8 g/dL (ref 30.0–36.0)
MCV: 76.6 fL — ABNORMAL LOW (ref 80.0–100.0)
Platelets: 311 10*3/uL (ref 150–400)
RBC: 4.1 MIL/uL (ref 3.87–5.11)
RDW: 16.5 % — ABNORMAL HIGH (ref 11.5–15.5)
WBC: 13.7 10*3/uL — ABNORMAL HIGH (ref 4.0–10.5)
nRBC: 0.4 % — ABNORMAL HIGH (ref 0.0–0.2)

## 2019-03-25 LAB — GLUCOSE, CAPILLARY
Glucose-Capillary: 243 mg/dL — ABNORMAL HIGH (ref 70–99)
Glucose-Capillary: 128 mg/dL — ABNORMAL HIGH (ref 70–99)
Glucose-Capillary: 164 mg/dL — ABNORMAL HIGH (ref 70–99)
Glucose-Capillary: 177 mg/dL — ABNORMAL HIGH (ref 70–99)

## 2019-03-25 LAB — VITAMIN B12: Vitamin B-12: 392 pg/mL (ref 180–914)

## 2019-03-25 MED ORDER — LEVETIRACETAM IN NACL 500 MG/100ML IV SOLN
500.0000 mg | Freq: Two times a day (BID) | INTRAVENOUS | Status: DC
  Administered 2019-03-25 – 2019-03-27 (×4): 500 mg via INTRAVENOUS
  Filled 2019-03-25 (×4): qty 100

## 2019-03-25 MED ORDER — HYDRALAZINE HCL 20 MG/ML IJ SOLN
10.0000 mg | Freq: Three times a day (TID) | INTRAMUSCULAR | Status: DC
  Administered 2019-03-25 – 2019-03-27 (×6): 10 mg via INTRAVENOUS
  Filled 2019-03-25 (×6): qty 1

## 2019-03-25 MED ORDER — PANTOPRAZOLE SODIUM 40 MG IV SOLR
20.0000 mg | Freq: Every day | INTRAVENOUS | Status: DC
  Administered 2019-03-25 – 2019-03-26 (×2): 20 mg via INTRAVENOUS
  Filled 2019-03-25 (×2): qty 40

## 2019-03-25 MED ORDER — METOPROLOL TARTRATE 5 MG/5ML IV SOLN
2.5000 mg | Freq: Four times a day (QID) | INTRAVENOUS | Status: DC
  Administered 2019-03-26 – 2019-03-27 (×5): 2.5 mg via INTRAVENOUS
  Filled 2019-03-25 (×5): qty 5

## 2019-03-25 MED ORDER — ASPIRIN 300 MG RE SUPP
150.0000 mg | Freq: Every day | RECTAL | Status: DC
  Administered 2019-03-25 – 2019-03-27 (×3): 150 mg via RECTAL
  Filled 2019-03-25 (×4): qty 1

## 2019-03-25 MED ORDER — DONEPEZIL HCL 5 MG PO TABS: 5.0000 mg | ORAL_TABLET | Freq: Every day | ORAL | Status: DC

## 2019-03-25 NOTE — Progress Notes (Signed)
Family Medicine Teaching Service Daily Progress Note Intern Pager: [phone]  Patient name: Melinda Lewis Medical record number: [id number] Date of birth: 05-29-1918 Age: 85 y.o. Gender: female  Primary Care Provider: Idelle Crouch, MD Consultants: Neurology Code Status: DNR  Pt Overview and Major Events to Date:  7/4 Patient admitted with tia 7/4 MR Brain 7/4 MRA head  Assessment and Plan:  TIA with hx of prior TIAs - CT head negative for acute bleed -Neurology following, appreciate recommendations.  - MRI and MRA - question of new infarct vs artifact  - Patient passed nurse bedside swallow test  - Echo   - Carotid ultrasound  - HTN corrected above 353 systolic per neurology  - 81mg  ASA per day  - Atorvastatin increased from home dose of 40mg >80mg  - Repeat CT shows chronic small vessel disease with no evidence of acute intracranial abnormality - Repeat EEG - no clear evidence of seizure or epileptic activity - Appreciate neuro recs - Once cleared medically plan to d/c patient home with family - Aricept started - Cxr ordered - UA ordered  Hypertension: - BP this a.m. (7/8) - 182/71 - Plan for permissive HTN to 299 systolic per neurology - Continue patients Metoprolol and Imdur to prevent rebound angina - Cont home Lisinopril at reduced dose of 10mg   Hypothyroidism - Home meds - Synthroid 150 - Current TSH is 0.445 - Continue home synthroid.  Mild Anemia - Hgb 10.0  this morning 7/8 - Continue monitoring   Insomnia - Cont. Home meds - trazodone and melatonin  FEN/GI: Regular diet PPx: heparin  Disposition: Likely home pending TIA workup  Subjective:  Patient very confused this a.m. Attempting to get out of bed and and unable to answer questions, including a&o questions. Patient's sister was present and stated patient has a history of some back pain and she believed the patient's change in mental status may in part be due to being uncomfortable in her  current bed. Discussed options with staff and determined bariatric bed may improve patient's status. CXR and UA were also ordered due to consideration for infection.   Objective: Temp:  [97.8 F (36.6 C)-98.9 F (37.2 C)] 98.1 F (36.7 C) (07/08 0441) Pulse Rate:  [71-109] 107 (07/08 0441) Resp:  [15-20] 20 (07/08 0441) BP: (145-198)/(53-114) 182/71 (07/08 0441) SpO2:  [95 %-99 %] 95 % (07/08 0441) Physical Exam: General: Confused, unable to answer questions Heart: Regular rate and rhythm with no murmurs appreciated Lungs: CTA bilaterally, no wheezing Abdomen: Bowel sounds present, no abdominal pain Skin: Warm and dry Extremities: No lower extremity edema  Laboratory: Recent Labs  Lab 03/22/19 0542 03/23/19 0450 03/24/19 0730  WBC 8.4 9.2 9.1  HGB 8.8* 8.8* 8.8*  HCT 28.9* 29.0* 28.9*  PLT 287 291 301   Recent Labs  Lab 03/21/19 1333  03/22/19 0542 03/23/19 0450 03/24/19 0730  NA 136   < > 140 143 137  K 4.4   < > 3.7 3.6 3.7  CL 101   < > 106 108 105  CO2 22  --  23 23 23   BUN 18   < > 16 17 14   CREATININE 1.12*   < > 1.13* 1.21* 0.93  CALCIUM 8.8*  --  8.6* 8.5* 8.3*  PROT 7.0  --   --   --   --   BILITOT 0.6  --   --   --   --   ALKPHOS 52  --   --   --   --  ALT 22  --   --   --   --   AST 24  --   --   --   --   GLUCOSE 121*   < > 106* 163* 131*   < > = values in this interval not displayed.     Imaging/Diagnostic Tests: Ct Head Wo Contrast  Result Date: 03/24/2019 CLINICAL DATA:  Stroke follow-up EXAM: CT HEAD WITHOUT CONTRAST TECHNIQUE: Contiguous axial images were obtained from the base of the skull through the vertex without intravenous contrast. COMPARISON:  Head CT 03/21/2019 FINDINGS: Brain: There is no mass, hemorrhage or extra-axial collection. The size and configuration of the ventricles and extra-axial CSF spaces are normal. There is hypoattenuation of the white matter, most commonly indicating chronic small vessel disease. Vascular:  Atherosclerotic calcification of the internal carotid arteries at the skull base. No abnormal hyperdensity of the major intracranial arteries or dural venous sinuses. Skull: The visualized skull base, calvarium and extracranial soft tissues are normal. Sinuses/Orbits: No fluid levels or advanced mucosal thickening of the visualized paranasal sinuses. No mastoid or middle ear effusion. The orbits are normal. IMPRESSION: Chronic small vessel disease without acute intracranial abnormality. Electronically Signed   By: Deatra RobinsonKevin  Herman M.D.   On: 03/24/2019 18:23   Mr Angio Head Wo Contrast  Result Date: 03/21/2019 CLINICAL DATA:  85 year old female with code stroke, aphasia and memory loss. EXAM: MRI HEAD WITHOUT AND WITH CONTRAST MRA HEAD WITHOUT CONTRAST TECHNIQUE: Multiplanar, multiecho pulse sequences of the brain and surrounding structures were obtained without and with intravenous contrast. Angiographic images of the head were obtained using MRA technique without contrast. CONTRAST:  7.5 milliliters Gadavist COMPARISON:  Head CT without contrast 1347 hours. Brain MRI 08/13/2018. FINDINGS: MRI HEAD FINDINGS Brain: There is a subtle, punctate area of subcortical white matter increased trace diffusion at the left temporal stem on series 9, image 64 and series 11, image 46. This is not obviously restricted on ADC. No other restricted diffusion. No midline shift, mass effect, evidence of mass lesion, ventriculomegaly, extra-axial collection or acute intracranial hemorrhage. Cervicomedullary junction and pituitary are within normal limits. A small chronic infarct in the left lower cerebellum is stable. Otherwise stable mild for age patchy cerebral white matter T2 and FLAIR hyperintensity. Stable mild involvement of the left lentiform, and also the pons. No chronic blood products. No abnormal enhancement identified. No dural thickening. Vascular: Major intracranial vascular flow voids are stable. The major dural venous  sinuses are enhancing and appear to be patent. Skull and upper cervical spine: Negative visible cervical spine. Normal bone marrow signal. Sinuses/Orbits: Stable. Other: Stable trace mastoid fluid. Visible internal auditory structures appear normal. Normal stylomastoid foramina. Scalp and face soft tissues appear negative. MRA HEAD FINDINGS Antegrade flow in the posterior circulation. Mildly dominant distal left vertebral artery. Patent PICA origins. No distal vertebral stenosis. Patent basilar artery, AICA origins, SCA origins and PCA origins. Posterior communicating arteries are diminutive or absent. Left PCA branches are within normal limits. There is a short segment moderate to severe stenosis of the right PCA P2 (series 1041, image 13) with otherwise normal right PCA branches. Antegrade flow in both ICA siphons. There is moderate bilateral siphon irregularity compatible with atherosclerosis. There is mild to moderate stenosis of the left anterior genu, and similar stenosis of the right supraclinoid segment. Both carotid termini are patent. MCA and ACA origins are patent. Visible bilateral ACA branches are within normal limits. Visible bilateral MCA branches are patent with only mild  irregularity. IMPRESSION: 1. Questionable punctate white matter infarct in the left temporal stem (series 11, image 46), although I favor artifact instead. 2. Otherwise no acute intracranial abnormality and stable MRI appearance of the brain since 2019. 3. Intracranial MRA is positive for intracranial atherosclerosis with - moderate stenosis of the bilateral ICA siphons. - moderate to severe stenosis of the right PCA P2 segment. Electronically Signed   By: Odessa Fleming M.D.   On: 03/21/2019 17:13   Mr Laqueta Jean YP Contrast  Result Date: 03/21/2019 CLINICAL DATA:  85 year old female with code stroke, aphasia and memory loss. EXAM: MRI HEAD WITHOUT AND WITH CONTRAST MRA HEAD WITHOUT CONTRAST TECHNIQUE: Multiplanar, multiecho pulse  sequences of the brain and surrounding structures were obtained without and with intravenous contrast. Angiographic images of the head were obtained using MRA technique without contrast. CONTRAST:  7.5 milliliters Gadavist COMPARISON:  Head CT without contrast 1347 hours. Brain MRI 08/13/2018. FINDINGS: MRI HEAD FINDINGS Brain: There is a subtle, punctate area of subcortical white matter increased trace diffusion at the left temporal stem on series 9, image 64 and series 11, image 46. This is not obviously restricted on ADC. No other restricted diffusion. No midline shift, mass effect, evidence of mass lesion, ventriculomegaly, extra-axial collection or acute intracranial hemorrhage. Cervicomedullary junction and pituitary are within normal limits. A small chronic infarct in the left lower cerebellum is stable. Otherwise stable mild for age patchy cerebral white matter T2 and FLAIR hyperintensity. Stable mild involvement of the left lentiform, and also the pons. No chronic blood products. No abnormal enhancement identified. No dural thickening. Vascular: Major intracranial vascular flow voids are stable. The major dural venous sinuses are enhancing and appear to be patent. Skull and upper cervical spine: Negative visible cervical spine. Normal bone marrow signal. Sinuses/Orbits: Stable. Other: Stable trace mastoid fluid. Visible internal auditory structures appear normal. Normal stylomastoid foramina. Scalp and face soft tissues appear negative. MRA HEAD FINDINGS Antegrade flow in the posterior circulation. Mildly dominant distal left vertebral artery. Patent PICA origins. No distal vertebral stenosis. Patent basilar artery, AICA origins, SCA origins and PCA origins. Posterior communicating arteries are diminutive or absent. Left PCA branches are within normal limits. There is a short segment moderate to severe stenosis of the right PCA P2 (series 1041, image 13) with otherwise normal right PCA branches. Antegrade  flow in both ICA siphons. There is moderate bilateral siphon irregularity compatible with atherosclerosis. There is mild to moderate stenosis of the left anterior genu, and similar stenosis of the right supraclinoid segment. Both carotid termini are patent. MCA and ACA origins are patent. Visible bilateral ACA branches are within normal limits. Visible bilateral MCA branches are patent with only mild irregularity. IMPRESSION: 1. Questionable punctate white matter infarct in the left temporal stem (series 11, image 46), although I favor artifact instead. 2. Otherwise no acute intracranial abnormality and stable MRI appearance of the brain since 2019. 3. Intracranial MRA is positive for intracranial atherosclerosis with - moderate stenosis of the bilateral ICA siphons. - moderate to severe stenosis of the right PCA P2 segment. Electronically Signed   By: Odessa Fleming M.D.   On: 03/21/2019 17:13   Dg Chest Port 1 View  Result Date: 03/21/2019 CLINICAL DATA:  Altered mental status EXAM: PORTABLE CHEST 1 VIEW COMPARISON:  10/05/2017 chest radiograph. FINDINGS: Stable cardiomediastinal silhouette with normal heart size. No pneumothorax. No pleural effusion. Stable calcified peripheral lower left lung granuloma. No pulmonary edema. No acute consolidative airspace disease. IMPRESSION: No active  disease. Electronically Signed   By: Delbert PhenixJason A Poff M.D.   On: 03/21/2019 15:09   Ct Head Code Stroke Wo Contrast  Result Date: 03/21/2019 CLINICAL DATA:  Code stroke.  Aphasia and memory loss. EXAM: CT HEAD WITHOUT CONTRAST TECHNIQUE: Contiguous axial images were obtained from the base of the skull through the vertex without intravenous contrast. COMPARISON:  08/13/2018 MRI.  09/08/2017 CT FINDINGS: Brain: No sign of acute infarction. Age related atrophy. Chronic small-vessel ischemic changes of the white matter. No mass lesion, hemorrhage, hydrocephalus or extra-axial collection. Vascular: There is atherosclerotic calcification of  the major vessels at the base of the brain. Skull: Negative Sinuses/Orbits: Clear/normal Other: None ASPECTS ([place] Stroke Program Early CT Score) - Ganglionic level infarction (caudate, lentiform nuclei, internal capsule, insula, M1-M3 cortex): 7 - Supraganglionic infarction (M4-M6 cortex): 3 Total score (0-10 with 10 being normal): 10 IMPRESSION: 1. No acute finding. Atrophy and chronic small-vessel ischemic changes. 2. ASPECTS is 10. 3. These results were communicated to Dr. Otelia LimesLindzen at 1:56 pmon 7/4/2020by text page via the [facility] messaging system. Electronically Signed   By: Paulina FusiMark  Shogry M.D.   On: 03/21/2019 13:57    Jackelyn PolingWelborn, Domenik Trice, MD 03/25/2019, 5:56 AM PGY-1, [facility] FPTS Intern pager: [phone], text pages welcome

## 2019-03-25 NOTE — Progress Notes (Addendum)
STROKE TEAM PROGRESS NOTE    SUBJECTIVE (INTERVAL HISTORY) Patient is lying flat on her back in a bedside recliner.  She appears sleepy and irritable and not cooperative for exam.  Her sister is at the bedside who informs me that she has a longstanding history of cognitive decline and dementia.  She is never been formally diagnosed or treated for the same.  The sister in fact recently moved from [place] to live with this sister to take care of her as she is no longer capable of managing own affairs.  She gets confused with her medications.  CT scan of the head done yesterday shows no acute abnormality.  EEG is ordered but not done yet.  B12 RPR and homocystine levels are pending.  TSH done previously was normal.  Patient has not been started on medication like Aricept before. She had abnormal UA and blood cultures have been sent.   OBJECTIVE Vitals:   03/24/19 2316 03/25/19 0441 03/25/19 0801 03/25/19 1156  BP: (!) 163/73 (!) 182/71 (!) 170/68 (!) 183/65  Pulse: (!) 109 (!) 107 95 84  Resp: 15 20 20 18   Temp: 98.4 F (36.9 C) 98.1 F (36.7 C) 99.1 F (37.3 C) 99.9 F (37.7 C)  TempSrc: Oral Oral Axillary Axillary  SpO2: 96% 95% 97% 95%  Weight:      Height:        CBC:  Recent Labs  Lab 03/21/19 1333  03/24/19 0730 03/25/19 0846  WBC 9.9   < > 9.1 13.7*  NEUTROABS 5.1  --   --   --   HGB 9.5*   < > 8.8* 10.0*  HCT 31.7*   < > 28.9* 31.4*  MCV 79.6*   < > 79.6* 76.6*  PLT 323   < > 301 311   < > = values in this interval not displayed.    Basic Metabolic Panel:  Recent Labs  Lab 03/24/19 0730 03/25/19 0846  NA 137 134*  K 3.7 3.1*  CL 105 103  CO2 23 19*  GLUCOSE 131* 118*  BUN 14 13  CREATININE 0.93 0.99  CALCIUM 8.3* 8.2*    Lipid Panel:     Component Value Date/Time   CHOL 107 03/22/2019 0542   TRIG 135 03/22/2019 0542   HDL 33 (L) 03/22/2019 0542   CHOLHDL 3.2 03/22/2019 0542   VLDL 27 03/22/2019 0542   LDLCALC 47 03/22/2019 0542   HgbA1c:  Lab  Results  Component Value Date   HGBA1C 7.4 (H) 03/21/2019   Urine Drug Screen: No results found for: LABOPIA, COCAINSCRNUR, LABBENZ, AMPHETMU, THCU, LABBARB  Alcohol Level No results found for: [facility]  IMAGING  Mr Angio Head Wo Contrast Mr Lodema Pilot Contrast 03/21/2019 IMPRESSION:  1. Questionable punctate white matter infarct in the left temporal stem (series 11, image 46), although I favor artifact instead.  2. Otherwise no acute intracranial abnormality and stable MRI appearance of the brain since 2019.  3. Intracranial MRA is positive for intracranial atherosclerosis with - moderate stenosis of the bilateral ICA siphons. - moderate to severe stenosis of the right PCA P2 segment.   Dg Chest Port 1 View 03/21/2019 IMPRESSION:  No active disease.   Ct Head Code Stroke Wo Contrast 03/21/2019 IMPRESSION:  1. No acute finding. Atrophy and chronic small-vessel ischemic changes.  2. ASPECTS is 10.    Transthoracic Echocardiogram  Normal ejection fraction.  No cardiac source of embolism.   EKG - SR rate 87 BPM. (See  cardiology reading for complete details)   PHYSICAL EXAM Blood pressure (!) 183/65, pulse 84, temperature 99.9 F (37.7 C), temperature source Axillary, resp. rate 18, height 5\' 2"  (1.575 m), weight 75.8 kg, SpO2 95 %. Pleasant elderly Caucasian lady currently not in distress. . Afebrile. Head is nontraumatic. Neck is supple without bruit.    Cardiac exam no murmur or gallop. Lungs are clear to auscultation. Distal pulses are well felt. Neurological Exam ;  Patient is sleepy but can be aroused.  Diminished attention, registration and recall.  Patient has nonfluent speech and speaks only a few words and occasional short sentences.  She can follow only simple midline and one-step commands.  eye movements full without nystagmus.fundi were not visualized. Vision acuity and fields appear normal. Hearing is diminished bilaterally . Palatal movements are normal. Face symmetric.  Tongue midline. Normal strength, tone, reflexes and coordination. Normal sensation. Gait deferred.         ASSESSMENT/PLAN Melinda Lewis is a 85 y.o. female with history of CHF, cardiomyopathy, CAD, DM, HTN, anemia, hypothyroidism, dementia, TIA and HLD presenting with AMS, facial droop and BP 248/98. She did not receive IV t-PA due to non focal exam.  Stroke vs TIA: Negative neuroimaging previously.  No recurrent symptoms of transient speech difficulties and confusion possible complex partial seizures.  Resultant no deficits  CT head -no acute finding  MRI head - questionable punctate white matter infarct in the left temporal stem (series 11, image 46), although artifact favored instead.   MRA head - moderate stenosis of the bilateral ICA siphons - moderate to severe stenosis of the right PCA P2 segment.   CTA H&N - not performed  Carotid Doppler  - not ordered  2D Echo -normal EF 60 to 65%.    EEG -normal   [employer] Virus 2 - negative  LDL - 47  HgbA1c - 7.4  UDS - not performed  VTE prophylaxis - [place] Heparin  Diet - regular  aspirin 81 mg daily prior to admission, now on aspirin 81 mg daily  Patient counseled to be compliant with her antithrombotic medications  Ongoing aggressive stroke risk factor management  Therapy recommendations: Home health disposition:  Pending  Hypertension  Blood pressure somewhat high at times but within post stroke parameters . Permissive hypertension (OK if < 220/120) but gradually normalize in 5-7 days . Long-term BP goal normotensive  Hyperlipidemia  Lipid lowering medication PTA:   Lipitor 40 mg daily  LDL 47, goal < 70  Current lipid lowering medication: Lipitor 80 mg daily  Continue statin at discharge  Diabetes  HgbA1c 7.4, goal < 7.0  Uncontrolled  Other Stroke Risk Factors  Advanced age  Former cigarette smoker - quit  Obesity, Body mass index is 30.56 kg/m., recommend weight loss, diet  and exercise as appropriate   Family hx stroke - not on file  Hx of TIA 2010  Coronary artery disease   Other Active Problems  Anemia - Hb - 9.5->11.2->8.8  Creatinine - 1.12->1.10->1.13 Moderate stenosis of the bilateral ICA siphons by MRA head -likely asymptomatic  Hospital day # 2  I  She presented with transient confusion in the setting of significantly elevated blood pressure which now appeared to have improved.  MRI scan does show a tiny punctate left temporal white matter weekly diffusion positive hyperintensity which is of questionable significance and cannot explain her presentation.  This could be a silent subacute lacunar infarct versus ictal MRI finding.. The patient's confusion appears  to be fluctuating and this may likely be due to underlying mild dementia with sundowning.  Today her confusion and speech difficulties are worse despite better blood pressure control.  Despite a normal EEG I would recommend a trial of Keppra 500 twice daily and repeat  EEG.  Primary team is also asked for palliative care consult to discuss with daughter and family goals of care.  Recommend trial of Aricept 5 mg daily with meals for 4 weeks to be increased if tolerated without side effects to 10 mg daily as she is apparently not had a trial of these dementia medications in the past.  Discussed with patient, bedside RN and Dr. Eustaquio Boydenyan [place].  Greater than 50% time during this 25-minute visit was spent on counseling and coordination of care about her recurrent episodes of confusion and speech difficulties and discussion about differential diagnosis and evaluation and treatment plan.   Melinda HeadyPramod Sethi, MD Medical Director [facility] Pager: [phone] 03/25/2019 1:43 PM   To contact Stroke Continuity provider, please refer to [email or web address]. After hours, contact General Neurology

## 2019-03-25 NOTE — Consult Note (Signed)
   [facility] CM Inpatient Consult   03/25/2019  Melinda Lewis Jan 23, 1918 [id number]    Patient reviewed for 23% high risk score for unplanned readmissions and hospitalizations under her [employer] plan. Patient had been previously outreached by [facility] RN care management coordinator and [place]. Our community based plan of care has focused on disease management and community resource support.  Chart review and MD notes show as follows: Melinda Lewis is a 85 y.o. female presenting with TIA . PMH is significant for history of TIA, dementia, hyperlipidemia, hypertension, history of NSTEMI, CAD, HFpEF 60-65%, SMA stenosis, aortic atherosclerosis, type 2 diabetes. Patient was admitted to the ED with a TIA that occurred at her home around noon that day (7/4). She initially had a minor TIA at her home which resolved but was followed by facial droop and confused speech a few minutes later. Her family called EMS. In the ED she had no focal deficits on admission and was A&O x3. Neurology was consulted. Head CT was negative for acute bleed and MRI and MRA showed questionable artifact vs new infarct as well as vascular stenosis.  Per transition of care CM note review, patient will transition home with home health services ([facility]). Patient lives with her sister who does her medications. Family provides needed transportation and will transport patient home when ready for discharge.  Primary Care provider is Dr. Fulton Reek with [facility].   Patient will be followed by an external care management group([facility]) post hospitalization to follow-up needs and for continued case management serviceswith [employer].   Of note, [facility] Care Management services does not replace or interfere with any services that are needed or arranged by inpatienttransition of carecase management or social work.  Will sign off.   For additional questions or  referrals,please contact:  Jahrel Borthwick A. Kamaiyah Uselton, BSN, RN-BC [facility] Liaison Cell: [phone]

## 2019-03-25 NOTE — Plan of Care (Signed)
?  Problem: Clinical Measurements: ?Goal: Will remain free from infection ?Outcome: Progressing ?  ?

## 2019-03-25 NOTE — Plan of Care (Signed)
  Problem: Education: Goal: Knowledge of General Education information will improve Description: Including pain rating scale, medication(s)/side effects and non-pharmacologic comfort measures Outcome: Progressing   Problem: Health Behavior/Discharge Planning: Goal: Ability to manage health-related needs will improve Outcome: Progressing   Problem: Clinical Measurements: Goal: Ability to maintain clinical measurements within normal limits will improve Outcome: Progressing Goal: Will remain free from infection Outcome: Progressing Goal: Diagnostic test results will improve Outcome: Progressing Goal: Respiratory complications will improve Outcome: Progressing Goal: Cardiovascular complication will be avoided Outcome: Progressing   Problem: Activity: Goal: Risk for activity intolerance will decrease Outcome: Progressing   Problem: Nutrition: Goal: Adequate nutrition will be maintained Outcome: Progressing   Problem: Coping: Goal: Level of anxiety will decrease Outcome: Progressing   Problem: Elimination: Goal: Will not experience complications related to bowel motility Outcome: Progressing Goal: Will not experience complications related to urinary retention Outcome: Progressing   Problem: Pain Managment: Goal: General experience of comfort will improve Outcome: Progressing   Problem: Safety: Goal: Ability to remain free from injury will improve Outcome: Progressing   Problem: Skin Integrity: Goal: Risk for impaired skin integrity will decrease Outcome: Progressing   Problem: Education: Goal: Knowledge of disease or condition will improve Outcome: Progressing Goal: Knowledge of secondary prevention will improve Outcome: Progressing Goal: Knowledge of patient specific risk factors addressed and post discharge goals established will improve Outcome: Progressing Goal: Individualized Educational Video(s) Outcome: Progressing   Problem: Health Behavior/Discharge  Planning: Goal: Ability to manage health-related needs will improve Outcome: Progressing   Problem: Ischemic Stroke/TIA Tissue Perfusion: Goal: Complications of ischemic stroke/TIA will be minimized Outcome: Progressing   Waynette Towers, BSN, RN 

## 2019-03-25 NOTE — Consult Note (Signed)
Consultation Note Date: 03/25/2019   Patient Name: Melinda Lewis  DOB: 07-30-1918  MRN: [id number]  Age / Sex: 85 y.o., female  PCP: Idelle Crouch, MD Referring Physician: McDiarmid, Blane Ohara, MD  Reason for Consultation: Establishing goals of care  HPI/Patient Profile: 85 y.o. female  with past medical history of TIA, dementia, HLD, HTN, h/o STEMI, CAD, CHF, aortic atherosclerosis, diabetes, PVD, hypothyroidism, chronic back pain with DDD, chronic shoulder pain with rotator cuff syndrome admitted on 03/21/2019 with likely TIA with underlying dementia and significantly declined functional status.   Clinical Assessment and Goals of Care: I met today at Ms. Llamas's bedside. Her sister, [name], is at bedside and is Ms. Alcorn's main caregiver. She recently sold her home and moved in to care for Ms. Ems permanently. [name] shares how she has cared for many of her family members during illness and at EOL. She plans to be with Ms. Raffel until the end.   [name] tells me that Ms. Lokken was able to ambulate independently, do light cleaning, and even go to the kitchen and make herself a sandwich prior to admission. She was able to function fairly independently but just needed someone there to assist at times. We discussed her change in status and the increased care needs. [name] intends to care for her at home but admits that she may need assistance from private caregivers and has even broached the subject of considering hospice at home with Ms. Elza's children. We discussed that hospice would likely be a good idea given her change in functional status and fluctuating mentation and intake. Ms. Matsushita can move and moves herself back in her chair but is unable to follow any commands to assist with activities. I explain that I am worried about her ability to rehab given her inability to follow commands and  truly participate. [name] understands. She is also refusing medications at times which will further impact her ability to have any improvements.   I plan to reach out to daughter [name] tomorrow to further discuss options of hospice vs home health in accordance with their goals for Ms. Schaum. Per my conversation with [name] it appears their main goal is to keep her home and focus on QOL. She did say the children even considered taking Ms. Sweeten home from the hospital prior to now as they felt we kept putting them through tests without any improvement and they were questioning the benefits of putting her through continued hospitalization.   Primary Decision Maker HCPOA adult children Sherian and Milus Banister    SUMMARY OF RECOMMENDATIONS   - Will speak further with children about expectations and GOC  Code Status/Advance Care Planning:  DNR   Symptom Management:   Per primary and neurology  Palliative Prophylaxis:   Aspiration, Bowel Regimen, Delirium Protocol, Frequent Pain Assessment, Oral Care and Turn Reposition  Psycho-social/Spiritual:   Desire for further Chaplaincy support:no  Additional Recommendations: Caregiving  Support/Resources, Education on Hospice and Grief/Bereavement Support  Prognosis:   <  6 months could be very likely given significant functional decline  Discharge Planning: To Be Determined      Primary Diagnoses: Present on Admission: . TIA (transient ischemic attack)   I have reviewed the medical record, interviewed the patient and family, and examined the patient. The following aspects are pertinent.  Past Medical History:  Diagnosis Date  . Acute on chronic systolic CHF (congestive heart failure) ([place]) 06/16/2014  . Anemia   . CAD (coronary artery disease) 8/15   s/p DES LCX and LAD   . Cardiomyopathy, ischemic 06/16/2014  . Diabetes mellitus without complication ([place])   . Hypertension   . NSTEMI (non-ST elevated myocardial infarction)  ([place])   . Thyroid disease   . TIA (transient ischemic attack) 2010   Social History   Socioeconomic History  . Marital status: Widowed    Spouse name: Not on file  . Number of children: Not on file  . Years of education: Not on file  . Highest education level: Not on file  Occupational History  . Not on file  Social Needs  . Financial resource strain: Not on file  . Food insecurity    Worry: Not on file    Inability: Not on file  . Transportation needs    Medical: Not on file    Non-medical: Not on file  Tobacco Use  . Smoking status: Former Smoker    Types: Cigarettes  . Smokeless tobacco: Never Used  Substance and Sexual Activity  . Alcohol use: No  . Drug use: No  . Sexual activity: Not on file  Lifestyle  . Physical activity    Days per week: Not on file    Minutes per session: Not on file  . Stress: Not on file  Relationships  . Social Herbalist on phone: Not on file    Gets together: Not on file    Attends religious service: Not on file    Active member of club or organization: Not on file    Attends meetings of clubs or organizations: Not on file    Relationship status: Not on file  Other Topics Concern  . Not on file  Social History Narrative  . Not on file   History reviewed. No pertinent family history. Scheduled Meds: . aspirin EC  81 mg Oral Daily  . atorvastatin  80 mg Oral q1800  . donepezil  5 mg Oral Q supper  . ferrous sulfate  325 mg Oral Q breakfast  . heparin  5,000 Units Subcutaneous Q8H  . hydrALAZINE  10 mg Intravenous TID  . insulin aspart  0-9 Units Subcutaneous TID WC  . isosorbide mononitrate  30 mg Oral Daily  . levETIRAcetam  500 mg Oral BID  . levothyroxine  150 mcg Oral Daily  . lisinopril  10 mg Oral Daily  . Melatonin  6 mg Oral QHS  . metoprolol succinate  25 mg Oral Daily  . pantoprazole  40 mg Oral q1800   Continuous Infusions: . sodium chloride 100 mL/hr at 03/24/19 2136   PRN Meds:.acetaminophen  **OR** acetaminophen (TYLENOL) oral liquid 160 mg/5 mL **OR** acetaminophen, nitroGLYCERIN, polyethylene glycol Allergies  Allergen Reactions  . Clonidine Derivatives Other (See Comments)    MADE THE PATIENT PASS OUT  . Cefuroxime Axetil Nausea Only and Other (See Comments)    Cramps, also  . Septra [Sulfamethoxazole-Trimethoprim] Nausea And Vomiting  . Whiskey [Alcohol] Hives  . Epinephrine Palpitations and Other (See Comments)  Heart races  . Latex Rash and Other (See Comments)    Causes sores on the skin  . Penicillins Palpitations    Heart races Did it involve swelling of the face/tongue/throat, SOB, or low BP? Unk Did it involve sudden or severe rash/hives, skin peeling, or any reaction on the inside of your mouth or nose? Unk Did you need to seek medical attention at a hospital or doctor's office? Yes When did it last happen? "During [employer]" (1940's) If all above answers are "NO", may proceed with cephalosporin use.   . Tape Rash and Other (See Comments)    Latex tape causes sores on the skin   Review of Systems  Unable to perform ROS: Dementia    Physical Exam Vitals signs and nursing note reviewed.  Constitutional:      General: She is not in acute distress.    Appearance: She is well-groomed. She is ill-appearing.     Comments: Frail, elderly   Cardiovascular:     Rate and Rhythm: Normal rate.  Pulmonary:     Effort: Pulmonary effort is normal. No tachypnea, accessory muscle usage or respiratory distress.  Abdominal:     General: Abdomen is flat.  Neurological:     Mental Status: She is alert. She is disoriented and confused.     Comments: Nonverbal      Vital Signs: BP (!) 183/65 (BP Location: Left Arm)   Pulse 84   Temp 99.9 F (37.7 C) (Axillary)   Resp 18   Ht _0  (1.575 m)   Wt 75.8 kg   SpO2 95%   BMI 30.56 kg/m  Pain Scale: Faces   Pain Score: 0-No pain   SpO2: SpO2: 95 % O2 Device:SpO2: 95 % O2 Flow Rate: .   IO: Intake/output  summary:   Intake/Output Summary (Last 24 hours) at 03/25/2019 1608 Last data filed at 03/25/2019 0441 Gross per 24 hour  Intake -  Output 800 ml  Net -800 ml    LBM: Last BM Date: 03/24/19 Baseline Weight: Weight: 75.8 kg Most recent weight: Weight: 75.8 kg     Palliative Assessment/Data: 40%     Time In: 1540 Time Out: 1630 Time Total: 50 min Greater than 50%  of this time was spent counseling and coordinating care related to the above assessment and plan.  Signed by: Vinie Sill, NP Palliative Medicine Team Pager # [phone] (M-F 8a-5p) Team Phone # [phone] (Nights/Weekends)

## 2019-03-26 ENCOUNTER — Inpatient Hospital Stay (HOSPITAL_COMMUNITY): Payer: PPO

## 2019-03-26 DIAGNOSIS — Z7189 Other specified counseling: Secondary | ICD-10-CM

## 2019-03-26 DIAGNOSIS — I82401 Acute embolism and thrombosis of unspecified deep veins of right lower extremity: Secondary | ICD-10-CM

## 2019-03-26 DIAGNOSIS — Z515 Encounter for palliative care: Secondary | ICD-10-CM

## 2019-03-26 DIAGNOSIS — F0390 Unspecified dementia without behavioral disturbance: Secondary | ICD-10-CM

## 2019-03-26 LAB — GLUCOSE, CAPILLARY
Glucose-Capillary: 144 mg/dL — ABNORMAL HIGH (ref 70–99)
Glucose-Capillary: 126 mg/dL — ABNORMAL HIGH (ref 70–99)
Glucose-Capillary: 110 mg/dL — ABNORMAL HIGH (ref 70–99)
Glucose-Capillary: 128 mg/dL — ABNORMAL HIGH (ref 70–99)

## 2019-03-26 LAB — URINALYSIS, ROUTINE W REFLEX MICROSCOPIC
Bilirubin Urine: NEGATIVE
Glucose, UA: 50 mg/dL — AB
Hgb urine dipstick: NEGATIVE
Ketones, ur: 5 mg/dL — AB
Leukocytes,Ua: NEGATIVE
Nitrite: NEGATIVE
Protein, ur: 100 mg/dL — AB
Specific Gravity, Urine: 1.018 (ref 1.005–1.030)
pH: 7 (ref 5.0–8.0)

## 2019-03-26 LAB — RPR: RPR Ser Ql: NONREACTIVE

## 2019-03-26 LAB — HOMOCYSTEINE: Homocysteine: 7.5 umol/L (ref 0.0–21.3)

## 2019-03-26 MED ORDER — LEVOTHYROXINE SODIUM 100 MCG/5ML IV SOLN: 75.0000 ug | Freq: Every day | INTRAVENOUS | Status: DC

## 2019-03-26 MED ORDER — LORAZEPAM 2 MG/ML IJ SOLN
1.0000 mg | Freq: Once | INTRAMUSCULAR | Status: AC
  Administered 2019-03-26: 1 mg via INTRAVENOUS
  Filled 2019-03-26: qty 1

## 2019-03-26 MED ORDER — LEVOTHYROXINE SODIUM 75 MCG PO TABS: 150.0000 ug | ORAL_TABLET | Freq: Every day | ORAL | Status: DC

## 2019-03-26 NOTE — Progress Notes (Signed)
Pt's temp 99.70F, will tx with prn Tylenol. Dr. Lorraine Lax informed.

## 2019-03-26 NOTE — Progress Notes (Addendum)
Pt not following the command to swallow and pt has PO synthroid. Dr. Lorraine Lax informed. Dr. Volanda Napoleon [facility]) informed as well of interventions and pt's status during this shift.

## 2019-03-26 NOTE — Progress Notes (Signed)
STROKE TEAM PROGRESS NOTE    SUBJECTIVE (INTERVAL HISTORY) Patient has had a neurological change this morning.  She has not been responsive and speaking and refusing medications as well.  Her sister sitting at the bedside informs me that she has not been talking ever since she has been here this morning.  There has been no witnessed seizure or tonic-clonic activity noted. T-max has been 99.9 and vital signs have been stable.  Blood cultures are pending  OBJECTIVE Vitals:   03/26/19 0426 03/26/19 0620 03/26/19 0814 03/26/19 1224  BP: (!) 167/54  (!) 172/51 (!) 158/58  Pulse: (!) 107  88 (!) 46  Resp: 18  18 18   Temp: 99.9 F (37.7 C) 99.7 F (37.6 C) 98.5 F (36.9 C) 97.7 F (36.5 C)  TempSrc: Oral Oral Axillary Oral  SpO2: 95%  96% 97%  Weight:      Height:        CBC:  Recent Labs  Lab 03/21/19 1333  03/24/19 0730 03/25/19 0846  WBC 9.9   < > 9.1 13.7*  NEUTROABS 5.1  --   --   --   HGB 9.5*   < > 8.8* 10.0*  HCT 31.7*   < > 28.9* 31.4*  MCV 79.6*   < > 79.6* 76.6*  PLT 323   < > 301 311   < > = values in this interval not displayed.    Basic Metabolic Panel:  Recent Labs  Lab 03/24/19 0730 03/25/19 0846  NA 137 134*  K 3.7 3.1*  CL 105 103  CO2 23 19*  GLUCOSE 131* 118*  BUN 14 13  CREATININE 0.93 0.99  CALCIUM 8.3* 8.2*    Lipid Panel:     Component Value Date/Time   CHOL 107 03/22/2019 0542   TRIG 135 03/22/2019 0542   HDL 33 (L) 03/22/2019 0542   CHOLHDL 3.2 03/22/2019 0542   VLDL 27 03/22/2019 0542   LDLCALC 47 03/22/2019 0542   HgbA1c:  Lab Results  Component Value Date   HGBA1C 7.4 (H) 03/21/2019   Urine Drug Screen: No results found for: LABOPIA, COCAINSCRNUR, LABBENZ, AMPHETMU, THCU, LABBARB  Alcohol Level No results found for: [facility]  IMAGING  Mr Angio Head Wo Contrast Mr Lodema Pilot Contrast 03/21/2019 IMPRESSION:  1. Questionable punctate white matter infarct in the left temporal stem (series 11, image 46), although I favor artifact  instead.  2. Otherwise no acute intracranial abnormality and stable MRI appearance of the brain since 2019.  3. Intracranial MRA is positive for intracranial atherosclerosis with - moderate stenosis of the bilateral ICA siphons. - moderate to severe stenosis of the right PCA P2 segment.   Dg Chest Port 1 View 03/21/2019 IMPRESSION:  No active disease.   Ct Head Code Stroke Wo Contrast 03/21/2019 IMPRESSION:  1. No acute finding. Atrophy and chronic small-vessel ischemic changes.  2. ASPECTS is 10.    Transthoracic Echocardiogram  Normal ejection fraction.  No cardiac source of embolism.   EKG - SR rate 87 BPM. (See cardiology reading for complete details)   PHYSICAL EXAM Blood pressure (!) 158/58, pulse (!) 46, temperature 97.7 F (36.5 C), temperature source Oral, resp. rate 18, height 5\' 2"  (1.575 m), weight 75.8 kg, SpO2 97 %. Pleasant elderly Caucasian lady currently not in distress. . Afebrile. Head is nontraumatic. Neck is supple without bruit.    Cardiac exam no murmur or gallop. Lungs are clear to auscultation. Distal pulses are well felt.   Neurological  Exam ;  Patient is sleepy but can be aroused.  She is mute and does not speak any words.  She will move the left side purposefully and moves right side less than the left.  She will not follow even simple midline commands .eyes showed left gaze deviation but she can follow partially to the right to the midline.fundi were not visualized. Vision acuity and fields appear normal. Hearing is diminished bilaterally . Palatal movements are normal. Face symmetric. Tongue midline. She moves left-sided extremities more than the right side.  Gait deferred.         ASSESSMENT/PLAN Melinda Lewis is a 85 y.o. female with history of CHF, cardiomyopathy, CAD, DM, HTN, anemia, hypothyroidism, dementia, TIA and HLD presenting with AMS, facial droop and BP 248/98. She did not receive IV t-PA due to non focal exam.  Stroke vs TIA:  Negative neuroimaging previously x 2.  Now recurrent symptoms of transient speech difficulties and confusion possible complex partial seizures.  Even though EEG x2-.  resultant -fluctuating confusion and speech difficulties  CT head -no acute finding  MRI head - questionable punctate white matter infarct in the left temporal stem (series 11, image 46), although artifact favored instead.   MRA head - moderate stenosis of the bilateral ICA siphons - moderate to severe stenosis of the right PCA P2 segment.   CTA H&N - not performed  Carotid Doppler  - not ordered  2D Echo -normal EF 60 to 65%.    EEG -normal  03/23/19 and 03/24/19  Loyal JacobsonSars Corona Virus 2 - negative  LDL - 47  HgbA1c - 7.4  UDS - not performed  VTE prophylaxis - [place] Heparin  Diet - regular  aspirin 81 mg daily prior to admission, now on aspirin 81 mg daily  Patient counseled to be compliant with her antithrombotic medications  Ongoing aggressive stroke risk factor management  Therapy recommendations: Home health disposition:  Pending  Hypertension  Blood pressure somewhat high at times but within post stroke parameters . Permissive hypertension (OK if < 220/120) but gradually normalize in 5-7 days . Long-term BP goal normotensive  Hyperlipidemia  Lipid lowering medication PTA:   Lipitor 40 mg daily  LDL 47, goal < 70  Current lipid lowering medication: Lipitor 80 mg daily  Continue statin at discharge  Diabetes  HgbA1c 7.4, goal < 7.0  Uncontrolled  Other Stroke Risk Factors  Advanced age  Former cigarette smoker - quit  Obesity, Body mass index is 30.56 kg/m., recommend weight loss, diet and exercise as appropriate   Family hx stroke - not on file  Hx of TIA 2010  Coronary artery disease   Other Active Problems  Anemia - Hb - 9.5->11.2->8.8  Creatinine - 1.12->1.10->1.13 Moderate stenosis of the bilateral ICA siphons by MRA head -likely asymptomatic  Hospital day # 3  I  She  presented with transient confusion in the setting of significantly elevated blood pressure which now appeared to have improved.  MRI scan does show a tiny punctate left temporal white matter weekly diffusion positive hyperintensity which is of questionable significance and cannot explain her presentation.  This could be a silent subacute lacunar infarct versus ictal MRI finding.. The patient's confusion appears to be fluctuating and this may likely be due to underlying mild dementia with sundowning.  Today her confusion and speech difficulties are worse despite better blood pressure control.  Despite a normal EEG I would recommend a trial of Keppra 500 twice daily and overnight  long term  EEG.  Primary team is also asked for palliative care consult to discuss with daughter and family goals of care.  Recommend trial of Aricept 5 mg daily with meals for 4 weeks to be increased if tolerated without side effects to 10 mg daily as she is apparently not had a trial of these dementia medications in the past.  Discussed with patient`s sister, bedside RN and Dr. Leonel Ramsay.  Greater than 50% time during this 35-minute visit was spent on counseling and coordination of care about her recurrent episodes of confusion and speech difficulties and discussion about differential diagnosis and evaluation and treatment plan.   Antony Contras, MD Medical Director [place] Pager: [phone] 03/26/2019 1:07 PM   To contact Stroke Continuity provider, please refer to [email or web address] After hours, contact General Neurology

## 2019-03-26 NOTE — Progress Notes (Addendum)
Family Medicine Teaching Service Daily Progress Note Intern Pager: [phone]  Patient name: Melinda Lewis Medical record number: [id number] Date of birth: April 16, 1918 Age: 85 y.o. Gender: female  Primary Care Provider: Marguarite Arbour, MD Consultants: Neurology Code Status: DNR  Pt Overview and Major Events to Date:  7/4 Patient admitted with tia 7/4 MR Brain 7/4 MRA head  Assessment and Plan:  TIA with hx of prior TIAs - CT head negative for acute bleed -Neurology following, appreciate recommendations.  - MRI and MRA - question of new infarct vs artifact  - Patient passed nurse bedside swallow test  - Echo   - Carotid ultrasound  - HTN corrected above 180 systolic per neurology  - 81mg  ASA per day  - Atorvastatin increased from home dose of 40mg >80mg  - Repeat CT shows chronic small vessel disease with no evidence of acute intracranial abnormality - Repeat EEG - no clear evidence of seizure or epileptic activity - Appreciate neuro recs - Once cleared medically plan to d/c patient home with family - Aricept started - Cxr - no signs of active cardiopulmonary disease - UA - pending  Hypertension: - BP this a.m. (7/9) - 167/54 - Plan for permissive HTN to 180 systolic per neurology - Continue patients Metoprolol and Imdur to prevent rebound angina - Cont home Lisinopril at reduced dose of 10mg   Hypothyroidism - Home meds - Synthroid 150 - Current TSH is 0.445 - Continue home synthroid.  Mild Anemia - Hgb 10.0  this morning 7/8 - Continue monitoring   Insomnia - Cont. Home meds - trazodone and melatonin  FEN/GI: Regular diet PPx: heparin  Disposition: Likely home pending TIA workup  Subjective:  Resting comfortably in bed. Sister at bedside. Sister says she slept well all night and didn't move and it seems the change in bed has helped. Patient did have a more swollen right mid-calf on physical exam that was not observed yesterday. Doppler ultrasound was ordered  to rule out dvt.   Objective: Temp:  [98.2 F (36.8 C)-99.9 F (37.7 C)] 99.9 F (37.7 C) (07/09 0426) Pulse Rate:  [84-107] 107 (07/09 0426) Resp:  [18-20] 18 (07/09 0426) BP: (131-193)/(52-76) 167/54 (07/09 0426) SpO2:  [94 %-97 %] 95 % (07/09 0426) Physical Exam: General: Resting comfortably in bed. Heart: Regular rate and rhythm with no murmurs appreciated Lungs: Patient resting so no deep breaths were observed but lungs otherwise clear with normal breathing rate. Abdomen: Bowel sounds present, no abdominal pain Skin: Warm and dry Extremities: Right leg appears more swollen mid-calf compared to left. Mild warmth noted in right calf compared to left.  Laboratory: Recent Labs  Lab 03/23/19 0450 03/24/19 0730 03/25/19 0846  WBC 9.2 9.1 13.7*  HGB 8.8* 8.8* 10.0*  HCT 29.0* 28.9* 31.4*  PLT 291 301 311   Recent Labs  Lab 03/21/19 1333  03/23/19 0450 03/24/19 0730 03/25/19 0846  NA 136   < > 143 137 134*  K 4.4   < > 3.6 3.7 3.1*  CL 101   < > 108 105 103  CO2 22   < > 23 23 19*  BUN 18   < > 17 14 13   CREATININE 1.12*   < > 1.21* 0.93 0.99  CALCIUM 8.8*   < > 8.5* 8.3* 8.2*  PROT 7.0  --   --   --   --   BILITOT 0.6  --   --   --   --   ALKPHOS 52  --   --   --   --  ALT 22  --   --   --   --   AST 24  --   --   --   --   GLUCOSE 121*   < > 163* 131* 118*   < > = values in this interval not displayed.     Imaging/Diagnostic Tests: Dg Chest 2 View  Result Date: 03/25/2019 CLINICAL DATA:  Shortness of breath. EXAM: CHEST - 2 VIEW COMPARISON:  None. FINDINGS: The heart size and mediastinal contours are within normal limits. Both lungs are clear. The visualized skeletal structures are unremarkable. IMPRESSION: No active cardiopulmonary disease. Electronically Signed   By: Gerome Samavid  Williams III M.D   On: 03/25/2019 15:51   Ct Head Wo Contrast  Result Date: 03/24/2019 CLINICAL DATA:  Stroke follow-up EXAM: CT HEAD WITHOUT CONTRAST TECHNIQUE: Contiguous axial images  were obtained from the base of the skull through the vertex without intravenous contrast. COMPARISON:  Head CT 03/21/2019 FINDINGS: Brain: There is no mass, hemorrhage or extra-axial collection. The size and configuration of the ventricles and extra-axial CSF spaces are normal. There is hypoattenuation of the white matter, most commonly indicating chronic small vessel disease. Vascular: Atherosclerotic calcification of the internal carotid arteries at the skull base. No abnormal hyperdensity of the major intracranial arteries or dural venous sinuses. Skull: The visualized skull base, calvarium and extracranial soft tissues are normal. Sinuses/Orbits: No fluid levels or advanced mucosal thickening of the visualized paranasal sinuses. No mastoid or middle ear effusion. The orbits are normal. IMPRESSION: Chronic small vessel disease without acute intracranial abnormality. Electronically Signed   By: Deatra RobinsonKevin  Herman M.D.   On: 03/24/2019 18:23   Mr Angio Head Wo Contrast  Result Date: 03/21/2019 CLINICAL DATA:  85 year old female with code stroke, aphasia and memory loss. EXAM: MRI HEAD WITHOUT AND WITH CONTRAST MRA HEAD WITHOUT CONTRAST TECHNIQUE: Multiplanar, multiecho pulse sequences of the brain and surrounding structures were obtained without and with intravenous contrast. Angiographic images of the head were obtained using MRA technique without contrast. CONTRAST:  7.5 milliliters Gadavist COMPARISON:  Head CT without contrast 1347 hours. Brain MRI 08/13/2018. FINDINGS: MRI HEAD FINDINGS Brain: There is a subtle, punctate area of subcortical white matter increased trace diffusion at the left temporal stem on series 9, image 64 and series 11, image 46. This is not obviously restricted on ADC. No other restricted diffusion. No midline shift, mass effect, evidence of mass lesion, ventriculomegaly, extra-axial collection or acute intracranial hemorrhage. Cervicomedullary junction and pituitary are within normal  limits. A small chronic infarct in the left lower cerebellum is stable. Otherwise stable mild for age patchy cerebral white matter T2 and FLAIR hyperintensity. Stable mild involvement of the left lentiform, and also the pons. No chronic blood products. No abnormal enhancement identified. No dural thickening. Vascular: Major intracranial vascular flow voids are stable. The major dural venous sinuses are enhancing and appear to be patent. Skull and upper cervical spine: Negative visible cervical spine. Normal bone marrow signal. Sinuses/Orbits: Stable. Other: Stable trace mastoid fluid. Visible internal auditory structures appear normal. Normal stylomastoid foramina. Scalp and face soft tissues appear negative. MRA HEAD FINDINGS Antegrade flow in the posterior circulation. Mildly dominant distal left vertebral artery. Patent PICA origins. No distal vertebral stenosis. Patent basilar artery, AICA origins, SCA origins and PCA origins. Posterior communicating arteries are diminutive or absent. Left PCA branches are within normal limits. There is a short segment moderate to severe stenosis of the right PCA P2 (series 1041, image 13) with otherwise normal  right PCA branches. Antegrade flow in both ICA siphons. There is moderate bilateral siphon irregularity compatible with atherosclerosis. There is mild to moderate stenosis of the left anterior genu, and similar stenosis of the right supraclinoid segment. Both carotid termini are patent. MCA and ACA origins are patent. Visible bilateral ACA branches are within normal limits. Visible bilateral MCA branches are patent with only mild irregularity. IMPRESSION: 1. Questionable punctate white matter infarct in the left temporal stem (series 11, image 46), although I favor artifact instead. 2. Otherwise no acute intracranial abnormality and stable MRI appearance of the brain since 2019. 3. Intracranial MRA is positive for intracranial atherosclerosis with - moderate stenosis of  the bilateral ICA siphons. - moderate to severe stenosis of the right PCA P2 segment. Electronically Signed   By: Genevie Ann M.D.   On: 03/21/2019 17:13   Mr Jeri Cos [place] Contrast  Result Date: 03/21/2019 CLINICAL DATA:  85 year old female with code stroke, aphasia and memory loss. EXAM: MRI HEAD WITHOUT AND WITH CONTRAST MRA HEAD WITHOUT CONTRAST TECHNIQUE: Multiplanar, multiecho pulse sequences of the brain and surrounding structures were obtained without and with intravenous contrast. Angiographic images of the head were obtained using MRA technique without contrast. CONTRAST:  7.5 milliliters Gadavist COMPARISON:  Head CT without contrast 1347 hours. Brain MRI 08/13/2018. FINDINGS: MRI HEAD FINDINGS Brain: There is a subtle, punctate area of subcortical white matter increased trace diffusion at the left temporal stem on series 9, image 64 and series 11, image 46. This is not obviously restricted on ADC. No other restricted diffusion. No midline shift, mass effect, evidence of mass lesion, ventriculomegaly, extra-axial collection or acute intracranial hemorrhage. Cervicomedullary junction and pituitary are within normal limits. A small chronic infarct in the left lower cerebellum is stable. Otherwise stable mild for age patchy cerebral white matter T2 and FLAIR hyperintensity. Stable mild involvement of the left lentiform, and also the pons. No chronic blood products. No abnormal enhancement identified. No dural thickening. Vascular: Major intracranial vascular flow voids are stable. The major dural venous sinuses are enhancing and appear to be patent. Skull and upper cervical spine: Negative visible cervical spine. Normal bone marrow signal. Sinuses/Orbits: Stable. Other: Stable trace mastoid fluid. Visible internal auditory structures appear normal. Normal stylomastoid foramina. Scalp and face soft tissues appear negative. MRA HEAD FINDINGS Antegrade flow in the posterior circulation. Mildly dominant distal left  vertebral artery. Patent PICA origins. No distal vertebral stenosis. Patent basilar artery, AICA origins, SCA origins and PCA origins. Posterior communicating arteries are diminutive or absent. Left PCA branches are within normal limits. There is a short segment moderate to severe stenosis of the right PCA P2 (series 1041, image 13) with otherwise normal right PCA branches. Antegrade flow in both ICA siphons. There is moderate bilateral siphon irregularity compatible with atherosclerosis. There is mild to moderate stenosis of the left anterior genu, and similar stenosis of the right supraclinoid segment. Both carotid termini are patent. MCA and ACA origins are patent. Visible bilateral ACA branches are within normal limits. Visible bilateral MCA branches are patent with only mild irregularity. IMPRESSION: 1. Questionable punctate white matter infarct in the left temporal stem (series 11, image 46), although I favor artifact instead. 2. Otherwise no acute intracranial abnormality and stable MRI appearance of the brain since 2019. 3. Intracranial MRA is positive for intracranial atherosclerosis with - moderate stenosis of the bilateral ICA siphons. - moderate to severe stenosis of the right PCA P2 segment. Electronically Signed   By: Lemmie Evens  Margo AyeHall M.D.   On: 03/21/2019 17:13   Dg Chest Port 1 View  Result Date: 03/21/2019 CLINICAL DATA:  Altered mental status EXAM: PORTABLE CHEST 1 VIEW COMPARISON:  10/05/2017 chest radiograph. FINDINGS: Stable cardiomediastinal silhouette with normal heart size. No pneumothorax. No pleural effusion. Stable calcified peripheral lower left lung granuloma. No pulmonary edema. No acute consolidative airspace disease. IMPRESSION: No active disease. Electronically Signed   By: Delbert PhenixJason A Poff M.D.   On: 03/21/2019 15:09   Ct Head Code Stroke Wo Contrast  Result Date: 03/21/2019 CLINICAL DATA:  Code stroke.  Aphasia and memory loss. EXAM: CT HEAD WITHOUT CONTRAST TECHNIQUE: Contiguous axial  images were obtained from the base of the skull through the vertex without intravenous contrast. COMPARISON:  08/13/2018 MRI.  09/08/2017 CT FINDINGS: Brain: No sign of acute infarction. Age related atrophy. Chronic small-vessel ischemic changes of the white matter. No mass lesion, hemorrhage, hydrocephalus or extra-axial collection. Vascular: There is atherosclerotic calcification of the major vessels at the base of the brain. Skull: Negative Sinuses/Orbits: Clear/normal Other: None ASPECTS ([place] Stroke Program Early CT Score) - Ganglionic level infarction (caudate, lentiform nuclei, internal capsule, insula, M1-M3 cortex): 7 - Supraganglionic infarction (M4-M6 cortex): 3 Total score (0-10 with 10 being normal): 10 IMPRESSION: 1. No acute finding. Atrophy and chronic small-vessel ischemic changes. 2. ASPECTS is 10. 3. These results were communicated to Dr. Otelia LimesLindzen at 1:56 pmon 7/4/2020by text page via the [facility] messaging system. Electronically Signed   By: Paulina FusiMark  Shogry M.D.   On: 03/21/2019 13:57    Jackelyn PolingWelborn, Sinclaire Artiga, MD 03/26/2019, 5:39 AM PGY-1, [facility] FPTS Intern pager: [phone], text pages welcome

## 2019-03-26 NOTE — Progress Notes (Signed)
Right lower extremity venous duplex completed. Preliminary results in Chart review CV Proc. [address], [place] 03/26/2019, 1207 PM:

## 2019-03-26 NOTE — Progress Notes (Signed)
Palliative:  HPI: 85 y.o. female  with past medical history of TIA, dementia, HLD, HTN, h/o STEMI, CAD, CHF, aortic atherosclerosis, diabetes, PVD, hypothyroidism, chronic back pain with DDD, chronic shoulder pain with rotator cuff syndrome admitted on 03/21/2019 with likely TIA with underlying dementia and significantly declined functional status.    I met again today at Melinda Lewis's bedside with Melinda Lewis, Arvin Lewis. Ms. Whidden continues to be somnolent and does not arouse or follow any commands. Lewis says she will arouse at times but only smiles and laughs but will not speak. She appears to be resting comfortably. Arvin Lewis tells me that she is wondering if Melinda Lewis is giving up. She shares that Melinda Lewis has always said she did NOT want to live to 85 y/o and she turns 85 y/o next month. She feels that Melinda Lewis may be ready to die.   I called and spoke with Melinda, Mariella Lewis. She is very tearful and frustrated that they have expressed that they want their Lewis to return home to spend time with Melinda. She is distraught that she could be missing valuable time with Melinda Lewis at the end of Melinda life. She shares that Melinda Lewis has been saying she was going to die a couple weeks ago. She confirms DNR and no plans for artificial feeding per Melinda Lewis's wishes. After long discussion and consideration it was decided that if blood cultures and EEG come back negative they would like to proceed with home with hospice as soon as possible. They would like to treat any infection if blood cultures come back positive. Mariella Lewis confirmed this plan with Melinda siblings and called me back to confirm.   All questions/concerns addressed. Emotional support provided.   Exam: Somnolent. Does not follow commands. Breathing regular, unlabored. Abd soft. No signs of discomfort or distress.   Plan: - If EEG and blood cultures negative proceed with home with hospice as soon as possible (no further testing/diagnostics  desired).  - If EEG or blood cultures positive than family desire treatment.   [place], NP Palliative Medicine Team Pager [phone] (Please see [email or web address] for schedule) Team Phone [phone]    Greater than 50%  of this time was spent counseling and coordinating care related to the above assessment and plan   The above conversation was completed via telephone due to the visitor restrictions during the COVID-19 pandemic. Thorough chart review and discussion with necessary members of the care team was completed as part of assessment.

## 2019-03-26 NOTE — Progress Notes (Signed)
vLTM EEG running. Notified Neuro. No skin breakdown

## 2019-03-26 NOTE — Progress Notes (Signed)
PT Cancellation Note  Patient Details Name: Melinda Lewis MRN: [id number] DOB: Mar 26, 1918   Cancelled Treatment:    Reason Eval/Treat Not Completed: Other (comment).  Pt on continuous EEG.  PT to check on pt tomorrow.  Thanks,  Barbarann Ehlers. Diera Wirkkala, PT, DPT  Acute Rehabilitation [phone] pager [phone] office  @ [facility]: [phone]     Harvie Heck 03/26/2019, 5:39 PM

## 2019-03-27 DIAGNOSIS — F0390 Unspecified dementia without behavioral disturbance: Secondary | ICD-10-CM

## 2019-03-27 LAB — CBC
HCT: 31.8 % — ABNORMAL LOW (ref 36.0–46.0)
Hemoglobin: 9.8 g/dL — ABNORMAL LOW (ref 12.0–15.0)
MCH: 24.3 pg — ABNORMAL LOW (ref 26.0–34.0)
MCHC: 30.8 g/dL (ref 30.0–36.0)
MCV: 78.7 fL — ABNORMAL LOW (ref 80.0–100.0)
Platelets: 317 10*3/uL (ref 150–400)
RBC: 4.04 MIL/uL (ref 3.87–5.11)
RDW: 16.8 % — ABNORMAL HIGH (ref 11.5–15.5)
WBC: 15.3 10*3/uL — ABNORMAL HIGH (ref 4.0–10.5)
nRBC: 0 % (ref 0.0–0.2)

## 2019-03-27 LAB — BASIC METABOLIC PANEL
Anion gap: 11 (ref 5–15)
BUN: 17 mg/dL (ref 8–23)
CO2: 18 mmol/L — ABNORMAL LOW (ref 22–32)
Calcium: 7.6 mg/dL — ABNORMAL LOW (ref 8.9–10.3)
Chloride: 110 mmol/L (ref 98–111)
Creatinine, Ser: 0.86 mg/dL (ref 0.44–1.00)
GFR calc Af Amer: >60 mL/min (ref >60)
GFR calc non Af Amer: 56 mL/min — ABNORMAL LOW (ref >60)
Glucose, Bld: 107 mg/dL — ABNORMAL HIGH (ref 70–99)
Potassium: 2.9 mmol/L — ABNORMAL LOW (ref 3.5–5.1)
Sodium: 139 mmol/L (ref 135–145)

## 2019-03-27 LAB — GLUCOSE, CAPILLARY
Glucose-Capillary: 114 mg/dL — ABNORMAL HIGH (ref 70–99)
Glucose-Capillary: 199 mg/dL — ABNORMAL HIGH (ref 70–99)
Glucose-Capillary: 160 mg/dL — ABNORMAL HIGH (ref 70–99)

## 2019-03-27 MED ORDER — HYDRALAZINE HCL 50 MG PO TABS: 25.0000 mg | ORAL_TABLET | Freq: Three times a day (TID) | ORAL | 0 refills | Status: DC

## 2019-03-27 MED ORDER — POTASSIUM CHLORIDE 10 MEQ/100ML IV SOLN
10.0000 meq | INTRAVENOUS | Status: AC
  Administered 2019-03-27 (×2): 10 meq via INTRAVENOUS
  Filled 2019-03-27 (×2): qty 100

## 2019-03-27 MED ORDER — DONEPEZIL HCL 5 MG PO TABS: 5.0000 mg | ORAL_TABLET | Freq: Every day | ORAL | 0 refills | Status: DC

## 2019-03-27 MED ORDER — LEVETIRACETAM 500 MG PO TABS: 500.0000 mg | ORAL_TABLET | Freq: Two times a day (BID) | ORAL | 0 refills | Status: DC

## 2019-03-27 MED ORDER — CALCIUM GLUCONATE-NACL 1-0.675 GM/50ML-% IV SOLN
1.0000 g | Freq: Once | INTRAVENOUS | Status: AC
  Administered 2019-03-27: 13:00:00 1000 mg via INTRAVENOUS
  Filled 2019-03-27: qty 50

## 2019-03-27 NOTE — Plan of Care (Signed)
Adequate for dischcharge

## 2019-03-27 NOTE — Progress Notes (Signed)
Family Medicine Teaching Service Daily Progress Note Intern Pager: [phone]  Patient name: Linard MillersMildred P Pogosyan Medical record number: [id number] Date of birth: Jan 31, 1918 Age: 85 y.o. Gender: female  Primary Care Provider: Marguarite ArbourSparks, Jeffrey D, MD Consultants: Neurology Code Status: DNR  Pt Overview and Major Events to Date:  7/4 Patient admitted with tia 7/4 MR Brain 7/4 MRA head  Assessment and Plan:  TIA with hx of prior TIAs - CT head negative for acute bleed -Neurology following, appreciate recommendations.  - HTN corrected above 180 systolic per neurology  - 81mg  ASA per day   - EEG overnight (7/10) - Atorvastatin increased from home dose of 40mg >80mg  - Repeat CT shows chronic small vessel disease with no evidence of acute intracranial abnormality - Appreciate neuro and palliative recs -EEG-showed no epileptiform discharges. It was consistent with a focal cerebral disturbance in the left central region - WBC increased to 15.3 today-discussed with Dr Pollie MeyerMcintyre who was not concerned about source of this raised WBC  - Blood cultures - negative X 48 hrs   Hypertension: - BP this a.m. (7/10) -193/64 - Plan for permissive HTN to 180 systolic per neurology - Continue patients Metoprolol and Imdur to prevent rebound angina - Cont home Lisinopril at reduced dose of 10mg   Hypothyroidism - Home meds -Synthroid 150 - Current TSH is 0.445 - Continue home synthroid.  Mild Anemia - Hgb 9.8  this morning 7/10 - Continue monitoring   Hypokalemia - 2.49mmol/L 7/10 -60meQ of K prescribed  Hypocalcemia -  7.6mg /dL 1/477/10 - Calcium gluconate prescribed  Insomnia - Cont. Home meds - trazodone and melatonin  FEN/GI:  Dysphagia 3 PPx: heparin  Disposition: home with hospice   Subjective:  Pt doing well this morning. Smiling and waving at me. According to sister Rhunette CroftMildred is doing much better and this is the best she has seen her all week. Denies any new concerns for TrufantMildred. Sister would  still like to take [place]red home regardless of EEG and blood culture outcome. She feels she would do better in her home environment. Sister would like to trial DespardMildred with some breakfast. I said that would be okay and I would find out the type of diet she needs.  Objective: Temp:  [97.5 F (36.4 C)-99.7 F (37.6 C)] 97.7 F (36.5 C) (07/10 0526) Pulse Rate:  [46-91] 90 (07/10 0526) Resp:  [16-18] 18 (07/10 0526) BP: (125-172)/(35-58) 129/40 (07/10 0526) SpO2:  [95 %-100 %] 96 % (07/10 0526)   Physical Exam:  General: alert, pleasant and smiling, unable to participate in exam Cardiac:s1 and s2 present. No s3 or s4 present. No rubs or gallops. Respiratory: chest clear, normal effort, No wheezes or crackles Abdomen: abdomen distended, soft, nontender, bowel sounds present Extremities: right  calf edematous> left calf Skin:  Thin skin, warm and dry, no rashes noted, multiple bruising Neuro: alert, not orientated to time or place Psych: Normal affect and mood   Laboratory: Recent Labs  Lab 03/24/19 0730 03/25/19 0846 03/27/19 0331  WBC 9.1 13.7* 15.3*  HGB 8.8* 10.0* 9.8*  HCT 28.9* 31.4* 31.8*  PLT 301 311 317   Recent Labs  Lab 03/21/19 1333  03/24/19 0730 03/25/19 0846 03/27/19 0331  NA 136   < > 137 134* 139  K 4.4   < > 3.7 3.1* 2.9*  CL 101   < > 105 103 110  CO2 22   < > 23 19* 18*  BUN 18   < > 14 13 17   CREATININE  1.12*   < > 0.93 0.99 0.86  CALCIUM 8.8*   < > 8.3* 8.2* 7.6*  PROT 7.0  --   --   --   --   BILITOT 0.6  --   --   --   --   ALKPHOS 52  --   --   --   --   ALT 22  --   --   --   --   AST 24  --   --   --   --   GLUCOSE 121*   < > 131* 118* 107*   < > = values in this interval not displayed.     Imaging/Diagnostic Tests: Dg Chest 2 View  Result Date: 03/25/2019 CLINICAL DATA:  Shortness of breath. EXAM: CHEST - 2 VIEW COMPARISON:  None. FINDINGS: The heart size and mediastinal contours are within normal limits. Both lungs are clear. The  visualized skeletal structures are unremarkable. IMPRESSION: No active cardiopulmonary disease. Electronically Signed   By: Gerome Sam III M.D   On: 03/25/2019 15:51   Ct Head Wo Contrast  Result Date: 03/24/2019 CLINICAL DATA:  Stroke follow-up EXAM: CT HEAD WITHOUT CONTRAST TECHNIQUE: Contiguous axial images were obtained from the base of the skull through the vertex without intravenous contrast. COMPARISON:  Head CT 03/21/2019 FINDINGS: Brain: There is no mass, hemorrhage or extra-axial collection. The size and configuration of the ventricles and extra-axial CSF spaces are normal. There is hypoattenuation of the white matter, most commonly indicating chronic small vessel disease. Vascular: Atherosclerotic calcification of the internal carotid arteries at the skull base. No abnormal hyperdensity of the major intracranial arteries or dural venous sinuses. Skull: The visualized skull base, calvarium and extracranial soft tissues are normal. Sinuses/Orbits: No fluid levels or advanced mucosal thickening of the visualized paranasal sinuses. No mastoid or middle ear effusion. The orbits are normal. IMPRESSION: Chronic small vessel disease without acute intracranial abnormality. Electronically Signed   By: Deatra Robinson M.D.   On: 03/24/2019 18:23   Mr Angio Head Wo Contrast  Result Date: 03/21/2019 CLINICAL DATA:  85 year old female with code stroke, aphasia and memory loss. EXAM: MRI HEAD WITHOUT AND WITH CONTRAST MRA HEAD WITHOUT CONTRAST TECHNIQUE: Multiplanar, multiecho pulse sequences of the brain and surrounding structures were obtained without and with intravenous contrast. Angiographic images of the head were obtained using MRA technique without contrast. CONTRAST:  7.5 milliliters Gadavist COMPARISON:  Head CT without contrast 1347 hours. Brain MRI 08/13/2018. FINDINGS: MRI HEAD FINDINGS Brain: There is a subtle, punctate area of subcortical white matter increased trace diffusion at the left  temporal stem on series 9, image 64 and series 11, image 46. This is not obviously restricted on ADC. No other restricted diffusion. No midline shift, mass effect, evidence of mass lesion, ventriculomegaly, extra-axial collection or acute intracranial hemorrhage. Cervicomedullary junction and pituitary are within normal limits. A small chronic infarct in the left lower cerebellum is stable. Otherwise stable mild for age patchy cerebral white matter T2 and FLAIR hyperintensity. Stable mild involvement of the left lentiform, and also the pons. No chronic blood products. No abnormal enhancement identified. No dural thickening. Vascular: Major intracranial vascular flow voids are stable. The major dural venous sinuses are enhancing and appear to be patent. Skull and upper cervical spine: Negative visible cervical spine. Normal bone marrow signal. Sinuses/Orbits: Stable. Other: Stable trace mastoid fluid. Visible internal auditory structures appear normal. Normal stylomastoid foramina. Scalp and face soft tissues appear negative. MRA HEAD FINDINGS  Antegrade flow in the posterior circulation. Mildly dominant distal left vertebral artery. Patent PICA origins. No distal vertebral stenosis. Patent basilar artery, AICA origins, SCA origins and PCA origins. Posterior communicating arteries are diminutive or absent. Left PCA branches are within normal limits. There is a short segment moderate to severe stenosis of the right PCA P2 (series 1041, image 13) with otherwise normal right PCA branches. Antegrade flow in both ICA siphons. There is moderate bilateral siphon irregularity compatible with atherosclerosis. There is mild to moderate stenosis of the left anterior genu, and similar stenosis of the right supraclinoid segment. Both carotid termini are patent. MCA and ACA origins are patent. Visible bilateral ACA branches are within normal limits. Visible bilateral MCA branches are patent with only mild irregularity. IMPRESSION:  1. Questionable punctate white matter infarct in the left temporal stem (series 11, image 46), although I favor artifact instead. 2. Otherwise no acute intracranial abnormality and stable MRI appearance of the brain since 2019. 3. Intracranial MRA is positive for intracranial atherosclerosis with - moderate stenosis of the bilateral ICA siphons. - moderate to severe stenosis of the right PCA P2 segment. Electronically Signed   By: Odessa FlemingH  Hall M.D.   On: 03/21/2019 17:13   Mr Laqueta JeanBrain W QMWo Contrast  Result Date: 03/21/2019 CLINICAL DATA:  85 year old female with code stroke, aphasia and memory loss. EXAM: MRI HEAD WITHOUT AND WITH CONTRAST MRA HEAD WITHOUT CONTRAST TECHNIQUE: Multiplanar, multiecho pulse sequences of the brain and surrounding structures were obtained without and with intravenous contrast. Angiographic images of the head were obtained using MRA technique without contrast. CONTRAST:  7.5 milliliters Gadavist COMPARISON:  Head CT without contrast 1347 hours. Brain MRI 08/13/2018. FINDINGS: MRI HEAD FINDINGS Brain: There is a subtle, punctate area of subcortical white matter increased trace diffusion at the left temporal stem on series 9, image 64 and series 11, image 46. This is not obviously restricted on ADC. No other restricted diffusion. No midline shift, mass effect, evidence of mass lesion, ventriculomegaly, extra-axial collection or acute intracranial hemorrhage. Cervicomedullary junction and pituitary are within normal limits. A small chronic infarct in the left lower cerebellum is stable. Otherwise stable mild for age patchy cerebral white matter T2 and FLAIR hyperintensity. Stable mild involvement of the left lentiform, and also the pons. No chronic blood products. No abnormal enhancement identified. No dural thickening. Vascular: Major intracranial vascular flow voids are stable. The major dural venous sinuses are enhancing and appear to be patent. Skull and upper cervical spine: Negative visible  cervical spine. Normal bone marrow signal. Sinuses/Orbits: Stable. Other: Stable trace mastoid fluid. Visible internal auditory structures appear normal. Normal stylomastoid foramina. Scalp and face soft tissues appear negative. MRA HEAD FINDINGS Antegrade flow in the posterior circulation. Mildly dominant distal left vertebral artery. Patent PICA origins. No distal vertebral stenosis. Patent basilar artery, AICA origins, SCA origins and PCA origins. Posterior communicating arteries are diminutive or absent. Left PCA branches are within normal limits. There is a short segment moderate to severe stenosis of the right PCA P2 (series 1041, image 13) with otherwise normal right PCA branches. Antegrade flow in both ICA siphons. There is moderate bilateral siphon irregularity compatible with atherosclerosis. There is mild to moderate stenosis of the left anterior genu, and similar stenosis of the right supraclinoid segment. Both carotid termini are patent. MCA and ACA origins are patent. Visible bilateral ACA branches are within normal limits. Visible bilateral MCA branches are patent with only mild irregularity. IMPRESSION: 1. Questionable punctate white matter infarct  in the left temporal stem (series 11, image 46), although I favor artifact instead. 2. Otherwise no acute intracranial abnormality and stable MRI appearance of the brain since 2019. 3. Intracranial MRA is positive for intracranial atherosclerosis with - moderate stenosis of the bilateral ICA siphons. - moderate to severe stenosis of the right PCA P2 segment. Electronically Signed   By: Genevie Ann M.D.   On: 03/21/2019 17:13   Dg Chest Port 1 View  Result Date: 03/21/2019 CLINICAL DATA:  Altered mental status EXAM: PORTABLE CHEST 1 VIEW COMPARISON:  10/05/2017 chest radiograph. FINDINGS: Stable cardiomediastinal silhouette with normal heart size. No pneumothorax. No pleural effusion. Stable calcified peripheral lower left lung granuloma. No pulmonary edema.  No acute consolidative airspace disease. IMPRESSION: No active disease. Electronically Signed   By: Ilona Sorrel M.D.   On: 03/21/2019 15:09   Ct Head Code Stroke Wo Contrast  Result Date: 03/21/2019 CLINICAL DATA:  Code stroke.  Aphasia and memory loss. EXAM: CT HEAD WITHOUT CONTRAST TECHNIQUE: Contiguous axial images were obtained from the base of the skull through the vertex without intravenous contrast. COMPARISON:  08/13/2018 MRI.  09/08/2017 CT FINDINGS: Brain: No sign of acute infarction. Age related atrophy. Chronic small-vessel ischemic changes of the white matter. No mass lesion, hemorrhage, hydrocephalus or extra-axial collection. Vascular: There is atherosclerotic calcification of the major vessels at the base of the brain. Skull: Negative Sinuses/Orbits: Clear/normal Other: None ASPECTS ([place] Stroke Program Early CT Score) - Ganglionic level infarction (caudate, lentiform nuclei, internal capsule, insula, M1-M3 cortex): 7 - Supraganglionic infarction (M4-M6 cortex): 3 Total score (0-10 with 10 being normal): 10 IMPRESSION: 1. No acute finding. Atrophy and chronic small-vessel ischemic changes. 2. ASPECTS is 10. 3. These results were communicated to Dr. Cheral Marker at [place] 7/4/2020by text page via the [facility] messaging system. Electronically Signed   By: Nelson Chimes M.D.   On: 03/21/2019 13:57   Vas [place] Lower Extremity Venous (dvt)  Result Date: 03/26/2019  Lower Venous Study Indications: Swelling, Right knee and distal thigh with warmth, and Erythema.  Performing Technologist: Toma Copier RVS  Examination Guidelines: A complete evaluation includes B-mode imaging, spectral Doppler, color Doppler, and power Doppler as needed of all accessible portions of each vessel. Bilateral testing is considered an integral part of a complete examination. Limited examinations for reoccurring indications may be performed as noted.  +---------+---------------+---------+-----------+----------+-------+  RIGHT    CompressibilityPhasicitySpontaneityPropertiesSummary +---------+---------------+---------+-----------+----------+-------+ CFV      Full           Yes      Yes                          +---------+---------------+---------+-----------+----------+-------+ SFJ      Full                                                 +---------+---------------+---------+-----------+----------+-------+ FV Prox  Full           Yes      Yes                          +---------+---------------+---------+-----------+----------+-------+ FV Mid   Full                                                 +---------+---------------+---------+-----------+----------+-------+  FV DistalFull           Yes      Yes                          +---------+---------------+---------+-----------+----------+-------+ PFV      Full           Yes      Yes                          +---------+---------------+---------+-----------+----------+-------+ POP      Full           Yes      Yes                          +---------+---------------+---------+-----------+----------+-------+ PTV      Full                                                 +---------+---------------+---------+-----------+----------+-------+ PERO     Full                                                 +---------+---------------+---------+-----------+----------+-------+   +----+---------------+---------+-----------+----------+-------+ LEFTCompressibilityPhasicitySpontaneityPropertiesSummary +----+---------------+---------+-----------+----------+-------+ CFV Full           Yes      Yes                          +----+---------------+---------+-----------+----------+-------+ SFJ Full                                                 +----+---------------+---------+-----------+----------+-------+     Summary: Right: There is no evidence of deep vein thrombosis in the lower extremity. No cystic structure found in  the popliteal fossa. Left: There is no evidence of a common femoral vein obstruction.  *See table(s) above for measurements and observations. Electronically signed by Lemar LivingsBrandon Cain MD on 03/26/2019 at 5:07:26 PM.    Final    Towanda OctavePoonam Mayjor Ager  03/27/2019, 5:38 AM PGY-1, [facility] FPTS Intern pager: [phone], text pages welcome

## 2019-03-27 NOTE — Progress Notes (Signed)
Melinda Haw, MD PGY-1  Telephone call with Dr Antonietta Barcelona he is happy for patient to be discharged. EEG negative for seizures. To continue on 500mg  BD Keppra. Also should continue Aricept 5mg  once daily for a month. Then increase to 10mg  daily after a month.  Follow up with neuro in 6 weeks.

## 2019-03-27 NOTE — Procedures (Signed)
LTM-EEG Report  HISTORY: Continuous video-EEG monitoring performed for45year old with TIA, possible seizure.  ACQUISITION: International 10-20 system for electrode placement; 18 channels with additional eyes linked to ipsilateral ears and EKG. Additional T1-T2 electrodes were used. Continuous video recording obtained.   EEG NUMBER:  MEDICATIONS:  Day1:see EMR  DAY #1: from13157/9/20to 0730 03/27/19 BACKGROUND: An overallmedium voltage continuous recording withgoodspontaneous variability and reactivity.Thebackground consisted ofmedium voltage 8Hz  posterior basic rhythm with frequent background theta activity. There was superimposed focal slowing in the left central regions primarily in sleep. State changes were observed with intact sleep architecture.   EPILEPTIFORM/PERIODIC ACTIVITY:none SEIZURES:none EVENTS:none reported  EKG: no significant arrhythmia  SUMMARY: This wasan abnormal continuous video EEG due to mild background slowing with additional left central slowing. No epileptiform discharges were seen. This was consistent with a focal cerebral disturbance in the left central region.

## 2019-03-27 NOTE — TOC Transition Note (Signed)
Transition of Care [facility]) - CM/SW Discharge Note   Patient Details  Name: CHRISOULA ZEGARRA MRN: [id number] Date of Birth: 09/14/1918  [facility]) CM/SW Contact:  Pollie Friar, RN Phone Number: 03/27/2019, 5:05 PM   Clinical Narrative:    Pt is discharging home with hospice services. CM provided the daughter choice and [facility] selected. Tim with [facility] has accepted the referral.  Daughter to provide transport home. DNR in the d/c packet for home.    Final next level of care: Home w Hospice Care Barriers to Discharge: No Barriers Identified   Patient Goals and CMS Choice   CMS Medicare.gov Compare Post Acute Care list provided to:: Patient Represenative (must comment)(daughter) Choice offered to / list presented to : Adult Children  Discharge Placement                       Discharge Plan and Services   Discharge Planning Services: CM Consult Post Acute Care Choice: Home Health                    HH Arranged: PT [facility] Agency: [place] Date [facility] Agency Contacted: 03/27/19   Representative spoke with at [place]: [place] ([place]) Interventions     Readmission Risk Interventions No flowsheet data found.

## 2019-03-27 NOTE — Progress Notes (Signed)
Physical Therapy Treatment Patient Details Name: Melinda Lewis MRN: [id number] DOB: 1917-10-25 Today's Date: 03/27/2019    History of Present Illness ALLE CELLI is a 85 y.o. female presenting with tia. Patient was having trouble following commands and was not recognize her family.  She has mild baseline dementia. CT head was negative for acute bleed.  MRI and MRA showed question of artifact versus new. PMH is significant for history of TIA, dementia, hyperlipidemia, hypertension, history of NSTEMI, CAD, HFpEF 60-65%, SMA stenosis, aortic atherosclerosis, type 2 diabetes    PT Comments    Patient seen for mobility progression. Patient's sister present and very supportive. Patient received sitting up in bed. Patient able to answer yes/no questions intermittently as well as patient only stating "I'm fine" during session. Patient requires Mod A to come to EOB. Once sitting upright able to maintain balance for ~8 minutes with close min guard. Sit to stand and stand pivot to recliner with +2HHA/Min A from PT and nursing staff. Able to stand from recliner with 1 HHA and maintain upright standing with light Min A. Patient sister feels she is coming back around, with speech the greatest limiting factor. Discussion with sister regarding discharge plans with plan to return home with [facility] services and possibly hospice - PT in agreement. Will continue to follow.     Follow Up Recommendations  Home health PT;Supervision/Assistance - 24 hour(family considering home hospice)     Equipment Recommendations  None recommended by PT    Recommendations for Other Services       Precautions / Restrictions Precautions Precautions: Fall Restrictions Weight Bearing Restrictions: No    Mobility  Bed Mobility Overal bed mobility: Needs Assistance Bed Mobility: Supine to Sit     Supine to sit: Mod assist     General bed mobility comments: Mod A to come to EOB; requires assist to guide LE towards EOB  and for trunk control to come into sitting  Transfers Overall transfer level: Needs assistance Equipment used: 2 person hand held assist Transfers: Sit to/from [employer] Sit to Stand: Min assist Stand pivot transfers: Min assist       General transfer comment: Min A with +2 HHA with nursing for sit to stand and stand pivot to recliner; cueing throughout for safety and sequencing; Able to stand from recliner with +1 HHA to power up into standing - assist for controlled descent  Ambulation/Gait             General Gait Details: deferred   Stairs             Wheelchair Mobility    Modified Rankin (Stroke Patients Only) Modified Rankin (Stroke Patients Only) Pre-Morbid Rankin Score: Moderate disability Modified Rankin: Moderately severe disability     Balance Overall balance assessment: Needs assistance Sitting-balance support: Bilateral upper extremity supported;Feet supported Sitting balance-Leahy Scale: Fair     Standing balance support: Bilateral upper extremity supported;During functional activity Standing balance-Leahy Scale: Poor Standing balance comment: reliant on UE support                            Cognition Arousal/Alertness: Awake/alert Behavior During Therapy: Flat affect Overall Cognitive Status: Impaired/Different from baseline Area of Impairment: Orientation;Following commands;Safety/judgement;Attention;Problem solving                 Orientation Level: Disoriented to;Place;Time;Situation Current Attention Level: Sustained   Following Commands: Follows one step commands with increased time;Follows one  step commands inconsistently Safety/Judgement: Decreased awareness of deficits;Decreased awareness of safety   Problem Solving: Slow processing;Decreased initiation;Difficulty sequencing;Requires verbal cues;Requires tactile cues General Comments: pateint only answering yes/no questions intermittently; says  "I'm fine" when asked how she has been doing      Exercises      General Comments General comments (skin integrity, edema, etc.): sister present throughout session - PT allowing pateitn increased tiem to answer questions, however sister very concerned and tries to give her increased cueing      Pertinent Vitals/Pain Pain Assessment: No/denies pain    Home Living                      Prior Function            PT Goals (current goals can now be found in the care plan section) Acute Rehab PT Goals Patient Stated Goal: return home PT Goal Formulation: With patient/family Time For Goal Achievement: 04/05/19 Potential to Achieve Goals: Good Progress towards PT goals: Progressing toward goals    Frequency    Min 3X/week      PT Plan Current plan remains appropriate    Co-evaluation              AM-PAC PT "6 Clicks" Mobility   Outcome Measure  Help needed turning from your back to your side while in a flat bed without using bedrails?: A Little Help needed moving from lying on your back to sitting on the side of a flat bed without using bedrails?: A Lot Help needed moving to and from a bed to a chair (including a wheelchair)?: A Lot Help needed standing up from a chair using your arms (e.g., wheelchair or bedside chair)?: A Little Help needed to walk in hospital room?: A Lot Help needed climbing 3-5 steps with a railing? : Total 6 Click Score: 13    End of Session Equipment Utilized During Treatment: Gait belt Activity Tolerance: Patient tolerated treatment well Patient left: in chair;with call bell/phone within reach;with chair alarm set Nurse Communication: Mobility status PT Visit Diagnosis: Unsteadiness on feet (R26.81)     Time: 1323-1350 PT Time Calculation (min) (ACUTE ONLY): 27 min  Charges:  $Therapeutic Activity: 23-37 mins                      Lanney Gins, PT, DPT Supplemental Physical Therapist 03/27/19 2:53 PM Pager:  [phone] Office: [phone]

## 2019-03-27 NOTE — Progress Notes (Signed)
STROKE TEAM PROGRESS NOTE    SUBJECTIVE (INTERVAL HISTORY) Patient sister is at the bedside.  Patient appears more alert and interactive today however her verbalization remains poor and she speaks only occasional words.  She is not currently following commands consistently.  Overnight continues EEG does not show any definitive epileptiform activity but does show focal left hemispheric slowing.  She has been able to take her medications and food this morning and has been more cooperative with staff.  Family wants to take the patient home with palliative care services.  OBJECTIVE Vitals:   03/26/19 2346 03/27/19 0526 03/27/19 0839 03/27/19 1324  BP: (!) 134/35 (!) 129/40 (!) 193/64 (!) 132/45  Pulse: 91 90 80 96  Resp: 16 18 15 18   Temp: (!) 97.5 F (36.4 C) 97.7 F (36.5 C) (!) 97.4 F (36.3 C) (!) 97.5 F (36.4 C)  TempSrc: Oral Oral Oral Oral  SpO2: 100% 96% 98% 97%  Weight:      Height:        CBC:  Recent Labs  Lab 03/21/19 1333  03/25/19 0846 03/27/19 0331  WBC 9.9   < > 13.7* 15.3*  NEUTROABS 5.1  --   --   --   HGB 9.5*   < > 10.0* 9.8*  HCT 31.7*   < > 31.4* 31.8*  MCV 79.6*   < > 76.6* 78.7*  PLT 323   < > 311 317   < > = values in this interval not displayed.    Basic Metabolic Panel:  Recent Labs  Lab 03/25/19 0846 03/27/19 0331  NA 134* 139  K 3.1* 2.9*  CL 103 110  CO2 19* 18*  GLUCOSE 118* 107*  BUN 13 17  CREATININE 0.99 0.86  CALCIUM 8.2* 7.6*    Lipid Panel:     Component Value Date/Time   CHOL 107 03/22/2019 0542   TRIG 135 03/22/2019 0542   HDL 33 (L) 03/22/2019 0542   CHOLHDL 3.2 03/22/2019 0542   VLDL 27 03/22/2019 0542   LDLCALC 47 03/22/2019 0542   HgbA1c:  Lab Results  Component Value Date   HGBA1C 7.4 (H) 03/21/2019   Urine Drug Screen: No results found for: LABOPIA, COCAINSCRNUR, LABBENZ, AMPHETMU, THCU, LABBARB  Alcohol Level No results found for: [facility]  IMAGING  Mr Angio Head Wo Contrast Mr Lodema PilotBrain W Wo  Contrast 03/21/2019 IMPRESSION:  1. Questionable punctate white matter infarct in the left temporal stem (series 11, image 46), although I favor artifact instead.  2. Otherwise no acute intracranial abnormality and stable MRI appearance of the brain since 2019.  3. Intracranial MRA is positive for intracranial atherosclerosis with - moderate stenosis of the bilateral ICA siphons. - moderate to severe stenosis of the right PCA P2 segment.   Dg Chest Port 1 View 03/21/2019 IMPRESSION:  No active disease.   Ct Head Code Stroke Wo Contrast 03/21/2019 IMPRESSION:  1. No acute finding. Atrophy and chronic small-vessel ischemic changes.  2. ASPECTS is 10.    Transthoracic Echocardiogram  Normal ejection fraction.  No cardiac source of embolism.   EKG - SR rate 87 BPM. (See cardiology reading for complete details)   PHYSICAL EXAM Blood pressure (!) 132/45, pulse 96, temperature (!) 97.5 F (36.4 C), temperature source Oral, resp. rate 18, height 5\' 2"  (1.575 m), weight 75.8 kg, SpO2 97 %. Pleasant elderly Caucasian lady currently not in distress. . Afebrile. Head is nontraumatic. Neck is supple without bruit.    Cardiac exam no murmur  or gallop. Lungs are clear to auscultation. Distal pulses are well felt.   Neurological Exam ;  Patient is is awake and interactive today.  She is unable to speak sentences but  doesspeak few words.    She will not follow even simple midline commands .eyes showed left gaze deviation but she can follow partially to the right to the midline.fundi were not visualized. alatal movements are normal. Face symmetric. Tongue midline. She moves left-sided extremities slightly more than the right side.  Gait deferred.         ASSESSMENT/PLAN [name] is a 85 y.o. female with history of CHF, cardiomyopathy, CAD, DM, HTN, anemia, hypothyroidism, dementia, TIA and HLD presenting with AMS, facial droop and BP 248/98. She did not receive IV t-PA due to non focal  exam.  Stroke vs TIA: Negative neuroimaging previously x 2.  Now recurrent symptoms of speech difficulties and confusion possible complex partial seizures.  Even though EEG x2-. She likely has a encephalopathy of unknown  etiology possibly neurological degenerative disorder like amyloid angiopathy and dementia  CT head -no acute finding  MRI head - questionable punctate white matter infarct in the left temporal stem (series 11, image 46), although artifact favored instead.   MRA head - moderate stenosis of the bilateral ICA siphons - moderate to severe stenosis of the right PCA P2 segment.   CTA H&N - not performed  Carotid Doppler  - not ordered  2D Echo -normal EF 60 to 65%.    EEG -normal  03/23/19 and 03/24/19  Loyal Jacobson Virus 2 - negative  LDL - 47  HgbA1c - 7.4  UDS - not performed  VTE prophylaxis - [place] Heparin  Diet - regular  aspirin 81 mg daily prior to admission, now on aspirin 81 mg daily  Patient counseled to be compliant with her antithrombotic medications  Ongoing aggressive stroke risk factor management  Therapy recommendations: Home health disposition:  Pending  Hypertension  Blood pressure somewhat high at times but within post stroke parameters . Permissive hypertension (OK if < 220/120) but gradually normalize in 5-7 days . Long-term BP goal normotensive  Hyperlipidemia  Lipid lowering medication PTA:   Lipitor 40 mg daily  LDL 47, goal < 70  Current lipid lowering medication: Lipitor 80 mg daily  Continue statin at discharge  Diabetes  HgbA1c 7.4, goal < 7.0  Uncontrolled  Other Stroke Risk Factors  Advanced age  Former cigarette smoker - quit  Obesity, Body mass index is 30.56 kg/m., recommend weight loss, diet and exercise as appropriate   Family hx stroke - not on file  Hx of TIA 2010  Coronary artery disease   Other Active Problems  Anemia - Hb - 9.5->11.2->8.8  Creatinine - 1.12->1.10->1.13 Moderate stenosis of  the bilateral ICA siphons by MRA head -likely asymptomatic  Hospital day # 4  I  She presented with  confusion in the setting of significantly elevated blood pressure which now appeared to have persisted and represents encephalopathy of unknown etiology.  Perhaps due to underlying neurodegenerative disorder like amyloid angiopathy given her baseline dementia.  MRI scan does show a tiny punctate left temporal white matter weekly diffusion positive hyperintensity which is of questionable significance and cannot explain her presentation.  This could be a silent subacute lacunar infarct versus ictal MRI finding.Marland Kitchen    Despite a normal EEG I would recommend a trial of Keppra 500mg  twice daily and trial of Aricept 5 mg daily for a month  to be increased to 10 mg daily.. Discussed with patient`s sister, and Dr. Posey Pronto patient's intern  and answered questions.  Greater than 50% time during this 25-minute visit was spent on counseling and coordination of care about her recurrent episodes of confusion and speech difficulties and discussion about differential diagnosis and evaluation and treatment plan.  Stroke team will sign off.  Kindly call for questions.  Follow-up as an outpatient in the clinic in 6 weeks Antony Contras, [place] Pager: [phone] 03/27/2019 4:31 PM   To contact Stroke Continuity provider, please refer to [email or web address] After hours, contact General Neurology

## 2019-03-27 NOTE — Progress Notes (Signed)
EEG LTM complete. No skin breakdwon

## 2019-03-27 NOTE — Care Management Important Message (Signed)
Important Message  Patient Details  Name: Melinda Lewis MRN: [id number] Date of Birth: 1918/06/03   Medicare Important Message Given:  Yes     Orbie Pyo 03/27/2019, 3:29 PM

## 2019-03-30 LAB — CULTURE, BLOOD (ROUTINE X 2)
Culture: NO GROWTH
Culture: NO GROWTH
Special Requests: ADEQUATE

## 2019-04-06 DIAGNOSIS — G311 Senile degeneration of brain, not elsewhere classified: Secondary | ICD-10-CM | POA: Diagnosis not present

## 2019-04-06 DIAGNOSIS — G4089 Other seizures: Secondary | ICD-10-CM | POA: Diagnosis not present

## 2019-04-06 DIAGNOSIS — E7849 Other hyperlipidemia: Secondary | ICD-10-CM | POA: Diagnosis not present

## 2019-04-06 DIAGNOSIS — E039 Hypothyroidism, unspecified: Secondary | ICD-10-CM | POA: Diagnosis not present

## 2019-04-06 DIAGNOSIS — D638 Anemia in other chronic diseases classified elsewhere: Secondary | ICD-10-CM | POA: Diagnosis not present

## 2019-04-06 DIAGNOSIS — E119 Type 2 diabetes mellitus without complications: Secondary | ICD-10-CM | POA: Diagnosis not present

## 2019-04-06 DIAGNOSIS — F0280 Dementia in other diseases classified elsewhere without behavioral disturbance: Secondary | ICD-10-CM | POA: Diagnosis not present

## 2019-04-06 DIAGNOSIS — I739 Peripheral vascular disease, unspecified: Secondary | ICD-10-CM | POA: Diagnosis not present

## 2019-04-07 ENCOUNTER — Other Ambulatory Visit: Payer: Self-pay | Admitting: Family Medicine

## 2019-04-08 DIAGNOSIS — E7800 Pure hypercholesterolemia, unspecified: Secondary | ICD-10-CM | POA: Diagnosis not present

## 2019-04-08 DIAGNOSIS — E1165 Type 2 diabetes mellitus with hyperglycemia: Secondary | ICD-10-CM | POA: Diagnosis not present

## 2019-04-08 DIAGNOSIS — G459 Transient cerebral ischemic attack, unspecified: Secondary | ICD-10-CM | POA: Diagnosis not present

## 2019-04-08 DIAGNOSIS — Z79899 Other long term (current) drug therapy: Secondary | ICD-10-CM | POA: Diagnosis not present

## 2019-04-08 DIAGNOSIS — I10 Essential (primary) hypertension: Secondary | ICD-10-CM | POA: Diagnosis not present

## 2019-04-08 DIAGNOSIS — E039 Hypothyroidism, unspecified: Secondary | ICD-10-CM | POA: Diagnosis not present

## 2019-04-24 DIAGNOSIS — I10 Essential (primary) hypertension: Secondary | ICD-10-CM | POA: Diagnosis not present

## 2019-04-24 DIAGNOSIS — R410 Disorientation, unspecified: Secondary | ICD-10-CM | POA: Diagnosis not present

## 2019-04-24 DIAGNOSIS — R413 Other amnesia: Secondary | ICD-10-CM | POA: Diagnosis not present

## 2019-05-04 DIAGNOSIS — I10 Essential (primary) hypertension: Secondary | ICD-10-CM | POA: Diagnosis not present

## 2019-05-15 DIAGNOSIS — I10 Essential (primary) hypertension: Secondary | ICD-10-CM | POA: Diagnosis not present

## 2019-05-15 DIAGNOSIS — Z79899 Other long term (current) drug therapy: Secondary | ICD-10-CM | POA: Diagnosis not present

## 2019-07-03 NOTE — Progress Notes (Signed)
Date:  07/03/2019   ID:  EVAMARIE RAETZ, DOB [birth year], MRN [id number]   PCP:  Idelle Crouch, MD  Cardiologist:  Chaya Dehaan Martinique, MD  Electrophysiologist:  None   Evaluation Performed:  Follow-Up Visit  Chief Complaint:  HTN and chest pain  History of Present Illness:    Melinda Lewis is a 85 y.o. female with PMH of CAD, hypertension, DM 2, hyperlipidemia.  Patient was admitted in August 2015 with NSTEMI.  She had acute respiratory failure and was hypertensive.  EF was 35 to 40%.  Cardiac catheterization demonstrated severe disease in proximal to mid LAD in the mid left circumflex.  RCA had 80% disease in the mid vessel.  He underwent stenting of mid left circumflex and proximal and mid LAD.  RCA was managed medically.  Repeat echocardiogram in December 2015 showed normalization of EF.  Patient was seen again in September 2017 for atypical chest pain, echocardiogram was unchanged.  She has known SMA stenosis and moderate right iliac disease seen on previous CT.  Patient was last seen by Dr. Martinique in December 2019, at which time she was doing well.  Patient was seen in the ED on 09/19/2018 hypertension.  She also had significant nausea at the time.  She was given Zofran.  Given asymptomatic nature for hypertension, she was not admitted.  She was seen in the emergency room again on 11/04/2018 hand swelling was tremors, work-up including urinalysis and lab work were negative.    She was subsequently seen by Almyra Deforest PA-C for evaluation of atypical CP. She was started on Imdur for this and HTN. On subsequent follow up BP had improved and chest pain was less frequent.   She was admitted with recurrent TIAs in July with facial droop, word salad and confusion. MRI, CT head, EEG and Echo showed no acute changes. She was DC home with hospice.   She is seen with her sister today. She is in a wheelchair. Memory is worse. Denies chest pain or SOB. No edema. Sister reports lipitor was increased to  80 mg in July but LDL was 47 then so not sure if this is accurate.      Past Medical History:  Diagnosis Date  . Acute on chronic systolic CHF (congestive heart failure) ([place]) 06/16/2014  . Anemia   . CAD (coronary artery disease) 8/15   s/p DES LCX and LAD   . Cardiomyopathy, ischemic 06/16/2014  . Diabetes mellitus without complication ([place])   . Hypertension   . NSTEMI (non-ST elevated myocardial infarction) ([place])   . Thyroid disease   . TIA (transient ischemic attack) 2010   Past Surgical History:  Procedure Laterality Date  . LEFT HEART CATHETERIZATION WITH CORONARY ANGIOGRAM N/A 05/03/2014   Procedure: LEFT HEART CATHETERIZATION WITH CORONARY ANGIOGRAM;  Surgeon: Tammra Pressman M Martinique, MD;  Location: [facility] CATH LAB;  Service: Cardiovascular;  Laterality: N/A;  . PERCUTANEOUS CORONARY STENT INTERVENTION (PCI-S) Right 05/03/2014   Procedure: PERCUTANEOUS CORONARY STENT INTERVENTION (PCI-S);  Surgeon: Darrel Gloss M Martinique, MD;  Location: [facility] CATH LAB;  Service: Cardiovascular;  Laterality: Right;  . PERCUTANEOUS CORONARY STENT INTERVENTION (PCI-S) N/A 05/04/2014   Procedure: PERCUTANEOUS CORONARY STENT INTERVENTION (PCI-S);  Surgeon: Navaeh Kehres M Martinique, MD;  Location: [facility] CATH LAB;  Service: Cardiovascular;  Laterality: N/A;     No outpatient medications have been marked as taking for the 07/09/19 encounter (Appointment) with Martinique, Kenn Rekowski M, MD.     Allergies:   Clonidine derivatives, Cefuroxime axetil, Septra [  sulfamethoxazole-trimethoprim], Whiskey [alcohol], Epinephrine, Latex, Penicillins, and Tape   Social History   Tobacco Use  . Smoking status: Former Smoker    Types: Cigarettes  . Smokeless tobacco: Never Used  Substance Use Topics  . Alcohol use: No  . Drug use: No     Family Hx: The patient's family history is not on file.  ROS:   Please see the history of present illness.    All other systems reviewed and are negative.   Prior CV studies:   The following studies were reviewed  today:  Echo 03/22/19: IMPRESSIONS    1. The left ventricle has normal systolic function with an ejection fraction of 60-65%. The cavity size was normal. There is mildly increased left ventricular wall thickness. Left ventricular diastolic Doppler parameters are consistent with impaired  relaxation.  2. The right ventricle has normal systolic function. The cavity was normal.  3. The mitral valve is grossly normal. There is mild mitral annular calcification present.  4. The tricuspid valve is grossly normal.  5. The aortic valve is tricuspid. Mild thickening of the aortic valve. No stenosis of the aortic valve.  6. Normal LV systolic function; mild LVH; mild diastolic dysfunction.  Labs/Other Tests and Data Reviewed:    EKG:  No ECG reviewed.  Recent Labs: 03/21/2019: ALT 22; TSH 0.445 03/27/2019: BUN 17; Creatinine, Ser 0.86; Hemoglobin 9.8; Platelets 317; Potassium 2.9; Sodium 139   Recent Lipid Panel Lab Results  Component Value Date/Time   CHOL 107 03/22/2019 05:42 AM   TRIG 135 03/22/2019 05:42 AM   HDL 33 (L) 03/22/2019 05:42 AM   CHOLHDL 3.2 03/22/2019 05:42 AM   LDLCALC 47 03/22/2019 05:42 AM    Wt Readings from Last 3 Encounters:  03/21/19 167 lb 1.7 oz (75.8 kg)  11/21/18 165 lb (74.8 kg)  11/04/18 160 lb (72.6 kg)     Objective:    BP (!) 140/50 (BP Location: Right Arm, Patient Position: Sitting, Cuff Size: Normal)   Pulse (!) 59   Temp (!) 96.9 F (36.1 C)   Ht 5\' 2"  (1.575 m)   Wt 160 lb (72.6 kg)   BMI 29.26 kg/m   GENERAL:  Well appearing HEENT:  PERRL, EOMI, sclera are clear. Oropharynx is clear. NECK:  No jugular venous distention, carotid upstroke brisk and symmetric, no bruits, no thyromegaly or adenopathy LUNGS:  Clear to auscultation bilaterally CHEST:  Unremarkable HEART:  RRR,  PMI not displaced or sustained,S1 and S2 within normal limits, no S3, no S4: no clicks, no rubs, no murmurs ABD:  Soft, nontender. BS +, no masses or bruits. No  hepatomegaly, no splenomegaly EXT:  2 + pulses throughout, no edema, no cyanosis no clubbing SKIN:  Warm and dry.  No rashes NEURO:  Alert and oriented x 3. Cranial nerves II through XII intact. PSYCH:  Cognitively intact    ASSESSMENT & PLAN:    1.  CAD: really no significant angina. On aspirin,  amlodipine,  and Lipitor. Continue prn sl Ntg.   2.  Hypertension: BP has improved.   3.  Hyperlipidemia: On Lipitor 80 mg daily. Last LDL 47 in July.  4. DM2: Managed by primary care provider  5. PAD: known stenosis in the SMA and iliac. No limiting claudication or wounds  6. Memory loss.   7. TIAs  Medication Adjustments/Labs and Tests Ordered: Current medicines are reviewed at length with the patient today.  Concerns regarding medicines are outlined above.   Tests Ordered: No orders  of the defined types were placed in this encounter.   Medication Changes: No orders of the defined types were placed in this encounter.   Disposition:  Follow up in 6 months  Signed, Clyda Smyth [place], MD  07/03/2019 2:29 PM    [facility]

## 2019-07-09 ENCOUNTER — Other Ambulatory Visit: Payer: Self-pay

## 2019-07-09 ENCOUNTER — Ambulatory Visit (INDEPENDENT_AMBULATORY_CARE_PROVIDER_SITE_OTHER): Admitting: Cardiology

## 2019-07-09 ENCOUNTER — Encounter: Payer: Self-pay | Admitting: Cardiology

## 2019-07-09 ENCOUNTER — Other Ambulatory Visit: Payer: Self-pay | Admitting: Family Medicine

## 2019-07-09 VITALS — BP 140/50 | HR 59 | Temp 96.9°F | Ht 62.0 in | Wt 160.0 lb

## 2019-07-09 DIAGNOSIS — I10 Essential (primary) hypertension: Secondary | ICD-10-CM

## 2019-07-09 DIAGNOSIS — I739 Peripheral vascular disease, unspecified: Secondary | ICD-10-CM

## 2019-07-09 DIAGNOSIS — I25118 Atherosclerotic heart disease of native coronary artery with other forms of angina pectoris: Secondary | ICD-10-CM | POA: Diagnosis not present

## 2019-07-09 DIAGNOSIS — E119 Type 2 diabetes mellitus without complications: Secondary | ICD-10-CM | POA: Diagnosis not present

## 2019-07-09 MED ORDER — ATORVASTATIN CALCIUM 80 MG PO TABS: 80.0000 mg | ORAL_TABLET | Freq: Every day | ORAL | 0 refills | Status: DC

## 2019-10-28 DIAGNOSIS — Z79899 Other long term (current) drug therapy: Secondary | ICD-10-CM | POA: Diagnosis not present

## 2019-10-28 DIAGNOSIS — E538 Deficiency of other specified B group vitamins: Secondary | ICD-10-CM | POA: Diagnosis not present

## 2019-10-28 DIAGNOSIS — E039 Hypothyroidism, unspecified: Secondary | ICD-10-CM | POA: Diagnosis not present

## 2019-10-28 DIAGNOSIS — R0602 Shortness of breath: Secondary | ICD-10-CM | POA: Diagnosis not present

## 2019-10-28 DIAGNOSIS — E1165 Type 2 diabetes mellitus with hyperglycemia: Secondary | ICD-10-CM | POA: Diagnosis not present

## 2019-10-28 DIAGNOSIS — D649 Anemia, unspecified: Secondary | ICD-10-CM | POA: Diagnosis not present

## 2019-10-28 DIAGNOSIS — I10 Essential (primary) hypertension: Secondary | ICD-10-CM | POA: Diagnosis not present

## 2019-12-07 DIAGNOSIS — M9903 Segmental and somatic dysfunction of lumbar region: Secondary | ICD-10-CM | POA: Diagnosis not present

## 2019-12-07 DIAGNOSIS — M546 Pain in thoracic spine: Secondary | ICD-10-CM | POA: Diagnosis not present

## 2019-12-07 DIAGNOSIS — M9902 Segmental and somatic dysfunction of thoracic region: Secondary | ICD-10-CM | POA: Diagnosis not present

## 2019-12-07 DIAGNOSIS — M545 Low back pain: Secondary | ICD-10-CM | POA: Diagnosis not present

## 2019-12-10 DIAGNOSIS — M9902 Segmental and somatic dysfunction of thoracic region: Secondary | ICD-10-CM | POA: Diagnosis not present

## 2019-12-10 DIAGNOSIS — M546 Pain in thoracic spine: Secondary | ICD-10-CM | POA: Diagnosis not present

## 2019-12-10 DIAGNOSIS — M545 Low back pain: Secondary | ICD-10-CM | POA: Diagnosis not present

## 2019-12-10 DIAGNOSIS — M9903 Segmental and somatic dysfunction of lumbar region: Secondary | ICD-10-CM | POA: Diagnosis not present

## 2019-12-15 DIAGNOSIS — M9903 Segmental and somatic dysfunction of lumbar region: Secondary | ICD-10-CM | POA: Diagnosis not present

## 2019-12-15 DIAGNOSIS — M9902 Segmental and somatic dysfunction of thoracic region: Secondary | ICD-10-CM | POA: Diagnosis not present

## 2019-12-15 DIAGNOSIS — M546 Pain in thoracic spine: Secondary | ICD-10-CM | POA: Diagnosis not present

## 2019-12-15 DIAGNOSIS — M545 Low back pain: Secondary | ICD-10-CM | POA: Diagnosis not present

## 2020-01-04 NOTE — Progress Notes (Signed)
Date:  01/12/2020   ID:  [name], DOB Aug 07, 1918, MRN [id number]   PCP:  Marguarite Arbour, MD  Cardiologist:  Willim Turnage Sw[place], MD  Electrophysiologist:  None   Evaluation Performed:  Follow-Up Visit  Chief Complaint:  HTN and chest pain  History of Present Illness:    Melinda Lewis is a 86 y.o. female with PMH of CAD, hypertension, DM 2, hyperlipidemia.  Patient was admitted in August 2015 with NSTEMI.  She had acute respiratory failure and was hypertensive.  EF was 35 to 40%.  Cardiac catheterization demonstrated severe disease in proximal to mid LAD in the mid left circumflex.  RCA had 80% disease in the mid vessel.  She underwent stenting of mid left circumflex and proximal and mid LAD.  RCA was managed medically.  Repeat echocardiogram in December 2015 showed normalization of EF.  Patient was seen again in September 2017 for atypical chest pain, echocardiogram was unchanged.  She has known SMA stenosis and moderate right iliac disease seen on previous CT.  Patient was last seen by Dr. [place]and in December 2019, at which time she was doing well.  Patient was seen in the ED on 09/19/2018 hypertension.  She also had significant nausea at the time.  She was given Zofran.  Given asymptomatic nature for hypertension, she was not admitted.  She was seen in the emergency room again on 11/04/2018 hand swelling was tremors, work-up including urinalysis and lab work were negative.    She was subsequently seen by Azalee Course PA-C for evaluation of atypical CP. She was started on Imdur for this and HTN. On subsequent follow up BP had improved and chest pain was less frequent.   She was admitted with recurrent TIAs in July 2020 with facial droop, word salad and confusion. MRI, CT head, EEG and Echo showed no acute changes. She was DC home with hospice.   She is seen with her sister today. She is in a wheelchair. Memory is poor. Remains under hospice care. At home she just sits in her chair except to  walk to [place]. Denies SOB. Occasional chest pain. No edema. Sister notes hospice has recommended stopping atorvastatin and metformin. BS 104.      Past Medical History:  Diagnosis Date  . Acute on chronic systolic CHF (congestive heart failure) (HCC) 06/16/2014  . Anemia   . CAD (coronary artery disease) 8/15   s/p DES LCX and LAD   . Cardiomyopathy, ischemic 06/16/2014  . Diabetes mellitus without complication (HCC)   . Hypertension   . NSTEMI (non-ST elevated myocardial infarction) (HCC)   . Thyroid disease   . TIA (transient ischemic attack) 2010   Past Surgical History:  Procedure Laterality Date  . LEFT HEART CATHETERIZATION WITH CORONARY ANGIOGRAM N/A 05/03/2014   Procedure: LEFT HEART CATHETERIZATION WITH CORONARY ANGIOGRAM;  Surgeon: Canyon Willow M Sw[place], MD;  Location: [facility] CATH LAB;  Service: Cardiovascular;  Laterality: N/A;  . PERCUTANEOUS CORONARY STENT INTERVENTION (PCI-S) Right 05/03/2014   Procedure: PERCUTANEOUS CORONARY STENT INTERVENTION (PCI-S);  Surgeon: Stephanine Reas M [place], MD;  Location: [facility] CATH LAB;  Service: Cardiovascular;  Laterality: Right;  . PERCUTANEOUS CORONARY STENT INTERVENTION (PCI-S) N/A 05/04/2014   Procedure: PERCUTANEOUS CORONARY STENT INTERVENTION (PCI-S);  Surgeon: Natasja Niday M [place], MD;  Location: [facility] CATH LAB;  Service: Cardiovascular;  Laterality: N/A;     Current Meds  Medication Sig  . aspirin EC 81 MG tablet Take 81 mg by mouth daily.  Marland Kitchen donepezil (ARICEPT) 5  MG tablet TAKE ONE TABLET BY MOUTH EVERY DAY WITH SUPPER  . Flaxseed, Linseed, (BIO-FLAX) 1000 MG CAPS Take 1,000 mg by mouth daily at 10 pm.  . hydrALAZINE (APRESOLINE) 50 MG tablet Take 0.5 tablets (25 mg total) by mouth 3 (three) times daily. (Patient taking differently: Take 50 mg by mouth as needed. )  . levETIRAcetam (KEPPRA) 500 MG tablet Take 1 tablet (500 mg total) by mouth 2 (two) times daily.  Marland Kitchen levothyroxine (SYNTHROID) 100 MCG tablet Take 100 mcg by mouth daily before breakfast.  .  lisinopril (PRINIVIL,ZESTRIL) 40 MG tablet Take 1 tablet (40 mg total) by mouth daily.  Marland Kitchen loratadine (CLARITIN) 10 MG tablet Take 10 mg by mouth daily at 6 PM.  . losartan (COZAAR) 50 MG tablet Take 50 mg by mouth 2 (two) times daily.  . magnesium oxide (MAG-OX) 400 MG tablet Take 400 mg by mouth 2 (two) times daily.   . Melatonin 5 MG TABS Take 5 mg by mouth at bedtime.   . metoprolol succinate (TOPROL-XL) 25 MG 24 hr tablet TAKE ONE TABLET BY MOUTH EVERY DAY (Patient taking differently: Take 25 mg by mouth daily. )  . nitroGLYCERIN (NITROSTAT) 0.4 MG SL tablet Place 1 tablet (0.4 mg total) under the tongue every 5 (five) minutes as needed for chest pain.  Marland Kitchen ondansetron (ZOFRAN ODT) 4 MG disintegrating tablet Take 1 tablet (4 mg total) by mouth every 8 (eight) hours as needed for nausea or vomiting.  . polyethylene glycol (MIRALAX / GLYCOLAX) 17 g packet Take 17 g by mouth daily as needed for mild constipation or moderate constipation.  . QUEtiapine (SEROQUEL) 25 MG tablet Take 25 mg by mouth at bedtime.      Allergies:   Clonidine derivatives, Cefuroxime axetil, Septra [sulfamethoxazole-trimethoprim], Whiskey [alcohol], Epinephrine, Latex, Penicillins, and Tape   Social History   Tobacco Use  . Smoking status: Former Smoker    Types: Cigarettes  . Smokeless tobacco: Never Used  Substance Use Topics  . Alcohol use: No  . Drug use: No     Family Hx: The patient's family history is not on file.  ROS:   Please see the history of present illness.    All other systems reviewed and are negative.   Prior CV studies:   The following studies were reviewed today:  Echo 03/22/19: IMPRESSIONS    1. The left ventricle has normal systolic function with an ejection fraction of 60-65%. The cavity size was normal. There is mildly increased left ventricular wall thickness. Left ventricular diastolic Doppler parameters are consistent with impaired  relaxation.  2. The right ventricle has  normal systolic function. The cavity was normal.  3. The mitral valve is grossly normal. There is mild mitral annular calcification present.  4. The tricuspid valve is grossly normal.  5. The aortic valve is tricuspid. Mild thickening of the aortic valve. No stenosis of the aortic valve.  6. Normal LV systolic function; mild LVH; mild diastolic dysfunction.  Labs/Other Tests and Data Reviewed:    EKG:  No ECG reviewed.  Recent Labs: 03/21/2019: ALT 22; TSH 0.445 03/27/2019: BUN 17; Creatinine, Ser 0.86; Hemoglobin 9.8; Platelets 317; Potassium 2.9; Sodium 139   Recent Lipid Panel Lab Results  Component Value Date/Time   CHOL 107 03/22/2019 05:42 AM   TRIG 135 03/22/2019 05:42 AM   HDL 33 (L) 03/22/2019 05:42 AM   CHOLHDL 3.2 03/22/2019 05:42 AM   LDLCALC 47 03/22/2019 05:42 AM    Wt Readings from  Last 3 Encounters:  01/12/20 160 lb (72.6 kg)  07/09/19 160 lb (72.6 kg)  03/21/19 167 lb 1.7 oz (75.8 kg)     Objective:    BP (!) 177/69   Pulse 78   Ht 5\' 2"  (1.575 m)   Wt 160 lb (72.6 kg)   SpO2 95%   BMI 29.26 kg/m   GENERAL:  Well appearing, elderly WF in NAD. Seen in wheelchair.  HEENT:  PERRL, EOMI, sclera are clear. Oropharynx is clear. NECK:  No jugular venous distention, carotid upstroke brisk and symmetric, no bruits, no thyromegaly or adenopathy LUNGS:  Clear to auscultation bilaterally CHEST:  Unremarkable HEART:  RRR,  PMI not displaced or sustained,S1 and S2 within normal limits, no S3, no S4: no clicks, no rubs, no murmurs ABD:  Soft, nontender. BS +, no masses or bruits. No hepatomegaly, no splenomegaly EXT:  2 + pulses throughout, no edema, no cyanosis no clubbing SKIN:  Warm and dry.  No rashes NEURO:  Poor memory. PSYCH:  Cognitively intact    ASSESSMENT & PLAN:    1.  CAD: some intermittent angina.  On aspirin, Toprol XL. Continue prn sl Ntg.   2.  Hypertension: on multiple agents. BP fluctuating.   3.  Hyperlipidemia: Agree with  recommendations of stopping atorvastatin given advanced age and the fact she is now on hospice  4. DM2: Managed by primary care provider  5. PAD: known stenosis in the SMA and iliac. No limiting claudication. Appetite stable.  6. Memory loss.   7. TIAs  Medication Adjustments/Labs and Tests Ordered: Current medicines are reviewed at length with the patient today.  Concerns regarding medicines are outlined above.   Tests Ordered: No orders of the defined types were placed in this encounter.   Medication Changes: No orders of the defined types were placed in this encounter.   Disposition:  Follow up PRN  Signed, Khan Chura Martinique, MD  01/12/2020 4:29 PM    [place]

## 2020-01-12 ENCOUNTER — Ambulatory Visit (INDEPENDENT_AMBULATORY_CARE_PROVIDER_SITE_OTHER): Admitting: Cardiology

## 2020-01-12 ENCOUNTER — Other Ambulatory Visit: Payer: Self-pay

## 2020-01-12 ENCOUNTER — Encounter: Payer: Self-pay | Admitting: Cardiology

## 2020-01-12 VITALS — BP 177/69 | HR 78 | Ht 62.0 in | Wt 160.0 lb

## 2020-01-12 DIAGNOSIS — E785 Hyperlipidemia, unspecified: Secondary | ICD-10-CM | POA: Diagnosis not present

## 2020-01-12 DIAGNOSIS — I739 Peripheral vascular disease, unspecified: Secondary | ICD-10-CM | POA: Diagnosis not present

## 2020-01-12 DIAGNOSIS — E119 Type 2 diabetes mellitus without complications: Secondary | ICD-10-CM | POA: Diagnosis not present

## 2020-01-12 DIAGNOSIS — I25118 Atherosclerotic heart disease of native coronary artery with other forms of angina pectoris: Secondary | ICD-10-CM | POA: Diagnosis not present

## 2020-01-13 DIAGNOSIS — E039 Hypothyroidism, unspecified: Secondary | ICD-10-CM | POA: Diagnosis not present

## 2020-01-13 DIAGNOSIS — Z79899 Other long term (current) drug therapy: Secondary | ICD-10-CM | POA: Diagnosis not present

## 2020-01-13 DIAGNOSIS — E1165 Type 2 diabetes mellitus with hyperglycemia: Secondary | ICD-10-CM | POA: Diagnosis not present

## 2020-01-13 DIAGNOSIS — E7800 Pure hypercholesterolemia, unspecified: Secondary | ICD-10-CM | POA: Diagnosis not present

## 2020-01-13 DIAGNOSIS — I10 Essential (primary) hypertension: Secondary | ICD-10-CM | POA: Diagnosis not present

## 2020-01-13 DIAGNOSIS — Z0000 Encounter for general adult medical examination without abnormal findings: Secondary | ICD-10-CM | POA: Diagnosis not present

## 2020-03-29 DIAGNOSIS — G459 Transient cerebral ischemic attack, unspecified: Secondary | ICD-10-CM | POA: Diagnosis not present

## 2020-03-29 DIAGNOSIS — Z0000 Encounter for general adult medical examination without abnormal findings: Secondary | ICD-10-CM | POA: Diagnosis not present

## 2020-03-29 DIAGNOSIS — I10 Essential (primary) hypertension: Secondary | ICD-10-CM | POA: Diagnosis not present

## 2020-03-29 DIAGNOSIS — E538 Deficiency of other specified B group vitamins: Secondary | ICD-10-CM | POA: Diagnosis not present

## 2020-03-29 DIAGNOSIS — E1165 Type 2 diabetes mellitus with hyperglycemia: Secondary | ICD-10-CM | POA: Diagnosis not present

## 2020-03-29 DIAGNOSIS — E7800 Pure hypercholesterolemia, unspecified: Secondary | ICD-10-CM | POA: Diagnosis not present

## 2020-03-29 DIAGNOSIS — D649 Anemia, unspecified: Secondary | ICD-10-CM | POA: Diagnosis not present

## 2020-03-29 DIAGNOSIS — Z7689 Persons encountering health services in other specified circumstances: Secondary | ICD-10-CM | POA: Diagnosis not present

## 2020-03-29 DIAGNOSIS — E039 Hypothyroidism, unspecified: Secondary | ICD-10-CM | POA: Diagnosis not present

## 2020-04-19 ENCOUNTER — Emergency Department
Admission: EM | Admit: 2020-04-19 | Discharge: 2020-04-20 | Disposition: A | Discharge status: Home / Self Care | Payer: PPO | Attending: Emergency Medicine | Admitting: Emergency Medicine

## 2020-04-19 ENCOUNTER — Encounter: Payer: Self-pay | Admitting: Emergency Medicine

## 2020-04-19 ENCOUNTER — Other Ambulatory Visit: Payer: Self-pay

## 2020-04-19 DIAGNOSIS — I2510 Atherosclerotic heart disease of native coronary artery without angina pectoris: Secondary | ICD-10-CM | POA: Diagnosis not present

## 2020-04-19 DIAGNOSIS — Z743 Need for continuous supervision: Secondary | ICD-10-CM | POA: Diagnosis not present

## 2020-04-19 DIAGNOSIS — Z79890 Hormone replacement therapy (postmenopausal): Secondary | ICD-10-CM | POA: Diagnosis not present

## 2020-04-19 DIAGNOSIS — Z7982 Long term (current) use of aspirin: Secondary | ICD-10-CM | POA: Diagnosis not present

## 2020-04-19 DIAGNOSIS — Z79899 Other long term (current) drug therapy: Secondary | ICD-10-CM | POA: Insufficient documentation

## 2020-04-19 DIAGNOSIS — I10 Essential (primary) hypertension: Secondary | ICD-10-CM | POA: Diagnosis not present

## 2020-04-19 DIAGNOSIS — E119 Type 2 diabetes mellitus without complications: Secondary | ICD-10-CM | POA: Diagnosis not present

## 2020-04-19 DIAGNOSIS — I110 Hypertensive heart disease with heart failure: Secondary | ICD-10-CM | POA: Insufficient documentation

## 2020-04-19 DIAGNOSIS — E039 Hypothyroidism, unspecified: Secondary | ICD-10-CM | POA: Insufficient documentation

## 2020-04-19 DIAGNOSIS — R6889 Other general symptoms and signs: Secondary | ICD-10-CM | POA: Diagnosis not present

## 2020-04-19 DIAGNOSIS — I5023 Acute on chronic systolic (congestive) heart failure: Secondary | ICD-10-CM | POA: Diagnosis not present

## 2020-04-19 DIAGNOSIS — I499 Cardiac arrhythmia, unspecified: Secondary | ICD-10-CM | POA: Diagnosis not present

## 2020-04-19 LAB — BASIC METABOLIC PANEL
Anion gap: 10 (ref 5–15)
BUN: 24 mg/dL — ABNORMAL HIGH (ref 8–23)
CO2: 23 mmol/L (ref 22–32)
Calcium: 8.1 mg/dL — ABNORMAL LOW (ref 8.9–10.3)
Chloride: 99 mmol/L (ref 98–111)
Creatinine, Ser: 0.77 mg/dL (ref 0.44–1.00)
GFR calc Af Amer: >60 mL/min (ref >60)
GFR calc non Af Amer: >60 mL/min (ref >60)
Glucose, Bld: 107 mg/dL — ABNORMAL HIGH (ref 70–99)
Potassium: 4.3 mmol/L (ref 3.5–5.1)
Sodium: 132 mmol/L — ABNORMAL LOW (ref 135–145)

## 2020-04-19 LAB — CBC
HCT: 38.2 % (ref 36.0–46.0)
Hemoglobin: 12.5 g/dL (ref 12.0–15.0)
MCH: 31.2 pg (ref 26.0–34.0)
MCHC: 32.7 g/dL (ref 30.0–36.0)
MCV: 95.3 fL (ref 80.0–100.0)
Platelets: 247 10*3/uL (ref 150–400)
RBC: 4.01 MIL/uL (ref 3.87–5.11)
RDW: 14.2 % (ref 11.5–15.5)
WBC: 11.2 10*3/uL — ABNORMAL HIGH (ref 4.0–10.5)
nRBC: 0 % (ref 0.0–0.2)

## 2020-04-19 MED ORDER — HYDRALAZINE HCL 50 MG PO TABS
25.0000 mg | ORAL_TABLET | Freq: Once | ORAL | Status: AC
  Administered 2020-04-19: 25 mg via ORAL
  Filled 2020-04-19: qty 1

## 2020-04-19 NOTE — ED Provider Notes (Signed)
[facility] Emergency Department Provider Note   ____________________________________________    I have reviewed the triage vital signs and the nursing notes.   HISTORY  Chief Complaint Hypertension     HPI Melinda Lewis is a 86 y.o. female with history of CAD, CHF, diabetes, hypertension, dementia brought in for high blood pressure.  Patient reports children checked her blood pressure and found to be elevated significantly and EMS was called.  EMS reports initially elevated blood pressure as well.  Patient denies chest pain, no headache, no nausea or vomiting.  She is asymptomatic, per medical records she is on antihypertensives.  Review of medical records demonstrates a history of high blood pressure  Past Medical History:  Diagnosis Date  . Acute on chronic systolic CHF (congestive heart failure) (HCC) 06/16/2014  . Anemia   . CAD (coronary artery disease) 8/15   s/p DES LCX and LAD   . Cardiomyopathy, ischemic 06/16/2014  . Diabetes mellitus without complication (HCC)   . Hypertension   . NSTEMI (non-ST elevated myocardial infarction) (HCC)   . Thyroid disease   . TIA (transient ischemic attack) 2010    Patient Active Problem List   Diagnosis Date Noted  . Dementia without behavioral disturbance (HCC)   . Goals of care, counseling/discussion   . Palliative care encounter   . Delirium 03/25/2019  . Expressive dysphasia   . Altered mental status   . TIA (transient ischemic attack) 03/21/2019  . AKI (acute kidney injury) (HCC) 09/05/2018  . Diabetes mellitus without complication (HCC) 10/22/2017  . 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 Aortic atherosclerosis (HCC) 10/22/2017  . Carotid disease, bilateral (HCC) 10/22/2017  . Uncontrolled hypertension 09/09/2017  . Nausea & vomiting 09/08/2017  . Chronic systolic CHF (congestive heart failure) (HCC) 03/04/2015  . Acute on chronic systolic CHF (congestive heart failure) (HCC) 06/16/2014  .  Cardiomyopathy, ischemic 06/16/2014  . CAD, multiple vessel 06/15/2014  . Anemia 05/05/2014  . Hypothyroidism 05/05/2014  . 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/05/2014  . NSTEMI (non-ST elevated myocardial infarction) (HCC) 05/02/2014  . Hypertension 05/02/2014  . Pulmonary edema cardiac cause (HCC) 05/02/2014    Past Surgical History:  Procedure Laterality Date  . LEFT HEART CATHETERIZATION WITH CORONARY ANGIOGRAM N/A 05/03/2014   Procedure: LEFT HEART CATHETERIZATION WITH CORONARY ANGIOGRAM;  Surgeon: Peter M Swaziland, MD;  Location: [facility] CATH LAB;  Service: Cardiovascular;  Laterality: N/A;  . PERCUTANEOUS CORONARY STENT INTERVENTION (PCI-S) Right 05/03/2014   Procedure: PERCUTANEOUS CORONARY STENT INTERVENTION (PCI-S);  Surgeon: Peter M [place], MD;  Location: [facility] CATH LAB;  Service: Cardiovascular;  Laterality: Right;  . PERCUTANEOUS CORONARY STENT INTERVENTION (PCI-S) N/A 05/04/2014   Procedure: PERCUTANEOUS CORONARY STENT INTERVENTION (PCI-S);  Surgeon: Peter M [place], MD;  Location: [facility] CATH LAB;  Service: Cardiovascular;  Laterality: N/A;    Prior to Admission medications   Medication Sig Start Date End Date Taking? Authorizing Provider  aspirin EC 81 MG tablet Take 81 mg by mouth daily.    [provider]  donepezil (ARICEPT) 5 MG tablet TAKE ONE TABLET BY MOUTH EVERY DAY WITH SUPPER 04/07/19   Sparks, Duane Lope, MD  Flaxseed, Linseed, (BIO-FLAX) 1000 MG CAPS Take 1,000 mg by mouth daily at 10 pm.    [provider]  hydrALAZINE (APRESOLINE) 50 MG tablet Take 0.5 tablets (25 mg total) by mouth 3 (three) times daily. Patient taking differently: Take 50 mg by mouth as needed.  03/27/19 01/12/20  Marthenia Rolling, DO  levETIRAcetam (  KEPPRA) 500 MG tablet Take 1 tablet (500 mg total) by mouth 2 (two) times daily. 03/27/19   Marthenia Rolling, DO  levothyroxine (SYNTHROID) 100 MCG tablet Take 100 mcg by mouth daily before breakfast.    [provider]  lisinopril  (PRINIVIL,ZESTRIL) 40 MG tablet Take 1 tablet (40 mg total) by mouth daily. 09/13/17   Milagros Loll, MD  loratadine (CLARITIN) 10 MG tablet Take 10 mg by mouth daily at 6 PM.    [provider]  losartan (COZAAR) 50 MG tablet Take 50 mg by mouth 2 (two) times daily. 05/04/19 05/03/20  [provider]  magnesium oxide (MAG-OX) 400 MG tablet Take 400 mg by mouth 2 (two) times daily.     [provider]  Melatonin 5 MG TABS Take 5 mg by mouth at bedtime.     [provider]  metFORMIN (GLUCOPHAGE) 500 MG tablet Take 500 mg by mouth 2 (two) times daily. 01/18/20   [provider]  metoprolol succinate (TOPROL-XL) 25 MG 24 hr tablet TAKE ONE TABLET BY MOUTH EVERY DAY Patient taking differently: Take 25 mg by mouth daily.  12/09/18   Azalee Course, PA  nitroGLYCERIN (NITROSTAT) 0.4 MG SL tablet Place 1 tablet (0.4 mg total) under the tongue every 5 (five) minutes as needed for chest pain. 11/25/17 01/12/20  [place]il[place], Peter M, MD  ondansetron (ZOFRAN ODT) 4 MG disintegrating tablet Take 1 tablet (4 mg total) by mouth every 8 (eight) hours as needed for nausea or vomiting. 09/19/18   Emily Filbert, MD  polyethylene glycol (MIRALAX / GLYCOLAX) 17 g packet Take 17 g by mouth daily as needed for mild constipation or moderate constipation. 03/24/19   Jackelyn Poling, DO  QUEtiapine (SEROQUEL) 25 MG tablet Take 25 mg by mouth at bedtime.  12/17/16   [provider]     Allergies Clonidine derivatives, Cefuroxime axetil, Septra [sulfamethoxazole-trimethoprim], Whiskey [alcohol], Epinephrine, Latex, Penicillins, and Tape  History reviewed. No pertinent family history.  Social History Social History   Tobacco Use  . Smoking status: Former Smoker    Types: Cigarettes  . Smokeless tobacco: Never Used  Vaping Use  . Vaping Use: Never used  Substance Use Topics  . Alcohol use: No  . Drug use: No    Review of Systems  Constitutional: Denies dizziness Eyes: No  visual changes.  ENT: No sore throat. Cardiovascular: Denies chest Respiratory: Denies difficulty breathing Gastrointestinal: No abdominal pain.   Genitourinary: Denies dysuria Musculoskeletal: Negative for back pain. Skin: Negative for rash. Neurological: Negative for headaches, denies weakness   ____________________________________________   PHYSICAL EXAM:  VITAL SIGNS: ED Triage Vitals  Enc Vitals Group     BP 04/19/20 1945 (!) 174/72     Pulse Rate 04/19/20 1945 87     Resp 04/19/20 1945 15     Temp 04/19/20 1945 98.7 F (37.1 C)     Temp Source 04/19/20 1945 Oral     SpO2 04/19/20 1945 95 %     Weight 04/19/20 1942 72.6 kg (160 lb)     Height 04/19/20 1942 1.575 m (5\' 2" )     Head Circumference --      Peak Flow --      Pain Score 04/19/20 1942 0     Pain Loc --      Pain Edu? --      Excl. in GC? --     Constitutional: Alert No acute distress. Pleasant and interactive Eyes: Conjunctivae are normal.  Nose: No congestion/rhinnorhea. Mouth/Throat: Mucous membranes are moist.    Cardiovascular: Normal rate, regular rhythm.   Good peripheral circulation. Respiratory: Normal respiratory effort.  No retractions. Lungs CTAB. Gastrointestinal: Soft and nontender. No distention.   Musculoskeletal: Warm and well perfused Neurologic:  Normal speech and language. No gross focal neurologic deficits are appreciated.  Skin:  Skin is warm, dry and intact. No rash noted. Psychiatric: Mood and affect are normal. Speech and behavior are normal.  ____________________________________________   LABS (all labs ordered are listed, but only abnormal results are displayed)  Labs Reviewed  CBC - Abnormal; Notable for the following components:      Result Value   WBC 11.2 (*)    All other components within normal limits  BASIC METABOLIC PANEL - Abnormal; Notable for the following components:   Sodium 132 (*)    Glucose, Bld 107 (*)    BUN 24 (*)    Calcium 8.1 (*)    All  other components within normal limits   ____________________________________________  EKG  ED ECG REPORT I, Jene Every, the attending physician, personally viewed and interpreted this ECG.  Date: 04/19/2020  Rhythm: normal sinus rhythm QRS Axis: normal Intervals: normal ST/T Wave abnormalities: normal Narrative Interpretation: no evidence of acute ischemia  ____________________________________________  RADIOLOGY   ____________________________________________   PROCEDURES  Procedure(s) performed: No  Procedures   Critical Care performed: No ____________________________________________   INITIAL IMPRESSION / ASSESSMENT AND PLAN / ED COURSE  Pertinent labs & imaging results that were available during my care of the patient were reviewed by me and considered in my medical decision making (see chart for details).  Patient well-appearing and in no acute distress.  Blood pressure here is 174/72.  Patient has no physical complaints.  Review of blood pressures in the past demonstrates that her blood pressure today is in line with previous levels.  EKG is reassuring.  Will check CBC BMP, if reassuring appropriate for discharge with outpatient follow-up.  Lab work today is reassuring, blood pressure has improved without intervention, will give additional dose of her p.o. hydralazine appropriate for discharge with outpatient follow-up    ____________________________________________   FINAL CLINICAL IMPRESSION(S) / ED DIAGNOSES  Final diagnoses:  Essential hypertension        Note:  This document was prepared using Dragon voice recognition software and may include unintentional dictation errors.   Jene Every, MD 04/19/20 2255

## 2020-04-19 NOTE — ED Triage Notes (Signed)
Pt arrival via [facility] from home due to taking her blood pressure and it having a high reading. Ems got a blood pressure of 243/116, but patient denies dizziness, blurred vision, pain or other complaints. VS with Ems include CBG of 168, HR of 108, and 96% on RA.   EMS states that pt went into a bit of aflutter when they first picked her up but that she then vagaled down and went to NSR.

## 2020-04-20 ENCOUNTER — Telehealth: Payer: Self-pay | Admitting: Cardiology

## 2020-04-20 DIAGNOSIS — I10 Essential (primary) hypertension: Secondary | ICD-10-CM | POA: Diagnosis not present

## 2020-04-20 NOTE — Telephone Encounter (Signed)
Pt c/o BP issue: STAT if pt c/o blurred vision, one-sided weakness or slurred speech  1. What are your last 5 BP readings? This morning 196, 230, 210, does not remember bottom numbers  2. Are you having any other symptoms (ex. Dizziness, headache, blurred vision, passed out)? No  3. What is your BP issue? Patient's daughter states last night the patient's BP went up to 200 and she had arhythmia. She states after arriving to the ED her arhythmia was gone.She states the hospital gave her hydralazine. She states her BP went back up this morning and reached 230. She states she does not have any symptoms and her BP is 196. Please advise.

## 2020-04-20 NOTE — Telephone Encounter (Signed)
Spoke to patient's daughter Melinda Lewis.Stated mother had a episode last night she just did not feel well.B/P elevated systolic readings 173,177,181,190,210,230.EMS was called.She was taken to ED.EKG was ok.B/P went down.No chest pain.Patient is presently seeing PCP Dr.Jason Whitaker.Stated she wanted to make Dr.Jordan aware.Advised to call back if PCP wants her to be seen.Dr.Jordan is out of office.I will make him aware.

## 2020-05-24 ENCOUNTER — Other Ambulatory Visit: Payer: Self-pay | Admitting: Family Medicine

## 2020-05-27 ENCOUNTER — Other Ambulatory Visit: Payer: Self-pay | Admitting: Family Medicine

## 2020-06-29 DIAGNOSIS — M19012 Primary osteoarthritis, left shoulder: Secondary | ICD-10-CM | POA: Diagnosis not present

## 2020-06-29 DIAGNOSIS — I700 Atherosclerosis of aorta: Secondary | ICD-10-CM | POA: Diagnosis not present

## 2020-06-29 DIAGNOSIS — J40 Bronchitis, not specified as acute or chronic: Secondary | ICD-10-CM | POA: Diagnosis not present

## 2020-06-29 DIAGNOSIS — M19011 Primary osteoarthritis, right shoulder: Secondary | ICD-10-CM | POA: Diagnosis not present

## 2020-07-07 DIAGNOSIS — I160 Hypertensive urgency: Secondary | ICD-10-CM | POA: Diagnosis not present

## 2020-07-07 DIAGNOSIS — E038 Other specified hypothyroidism: Secondary | ICD-10-CM | POA: Diagnosis not present

## 2020-07-07 DIAGNOSIS — E875 Hyperkalemia: Secondary | ICD-10-CM | POA: Diagnosis not present

## 2020-07-28 DIAGNOSIS — I700 Atherosclerosis of aorta: Secondary | ICD-10-CM | POA: Diagnosis not present

## 2020-07-28 DIAGNOSIS — R5383 Other fatigue: Secondary | ICD-10-CM | POA: Diagnosis not present

## 2020-07-28 DIAGNOSIS — M19011 Primary osteoarthritis, right shoulder: Secondary | ICD-10-CM | POA: Diagnosis not present

## 2020-07-28 DIAGNOSIS — E039 Hypothyroidism, unspecified: Secondary | ICD-10-CM | POA: Diagnosis not present

## 2020-07-28 DIAGNOSIS — J40 Bronchitis, not specified as acute or chronic: Secondary | ICD-10-CM | POA: Diagnosis not present

## 2020-07-28 DIAGNOSIS — M19012 Primary osteoarthritis, left shoulder: Secondary | ICD-10-CM | POA: Diagnosis not present

## 2020-07-28 DIAGNOSIS — R350 Frequency of micturition: Secondary | ICD-10-CM | POA: Diagnosis not present

## 2020-08-01 DIAGNOSIS — R7989 Other specified abnormal findings of blood chemistry: Secondary | ICD-10-CM | POA: Diagnosis not present

## 2020-08-01 DIAGNOSIS — E875 Hyperkalemia: Secondary | ICD-10-CM | POA: Diagnosis not present

## 2020-08-04 DIAGNOSIS — E039 Hypothyroidism, unspecified: Secondary | ICD-10-CM | POA: Diagnosis not present

## 2020-08-04 DIAGNOSIS — I739 Peripheral vascular disease, unspecified: Secondary | ICD-10-CM | POA: Diagnosis not present

## 2020-08-04 DIAGNOSIS — F0390 Unspecified dementia without behavioral disturbance: Secondary | ICD-10-CM | POA: Diagnosis not present

## 2020-08-04 DIAGNOSIS — I5023 Acute on chronic systolic (congestive) heart failure: Secondary | ICD-10-CM | POA: Diagnosis not present

## 2020-08-04 DIAGNOSIS — I10 Essential (primary) hypertension: Secondary | ICD-10-CM | POA: Diagnosis not present

## 2020-08-04 DIAGNOSIS — F4323 Adjustment disorder with mixed anxiety and depressed mood: Secondary | ICD-10-CM | POA: Diagnosis not present

## 2020-08-04 DIAGNOSIS — J40 Bronchitis, not specified as acute or chronic: Secondary | ICD-10-CM | POA: Diagnosis not present

## 2020-09-08 DIAGNOSIS — Z961 Presence of intraocular lens: Secondary | ICD-10-CM | POA: Diagnosis not present

## 2020-09-13 DIAGNOSIS — E039 Hypothyroidism, unspecified: Secondary | ICD-10-CM | POA: Diagnosis not present

## 2020-09-13 DIAGNOSIS — Z0000 Encounter for general adult medical examination without abnormal findings: Secondary | ICD-10-CM | POA: Diagnosis not present

## 2020-09-28 DIAGNOSIS — E7800 Pure hypercholesterolemia, unspecified: Secondary | ICD-10-CM | POA: Diagnosis not present

## 2020-09-28 DIAGNOSIS — E1165 Type 2 diabetes mellitus with hyperglycemia: Secondary | ICD-10-CM | POA: Diagnosis not present

## 2020-09-28 DIAGNOSIS — Z0000 Encounter for general adult medical examination without abnormal findings: Secondary | ICD-10-CM | POA: Diagnosis not present

## 2020-09-28 DIAGNOSIS — G459 Transient cerebral ischemic attack, unspecified: Secondary | ICD-10-CM | POA: Diagnosis not present

## 2020-09-28 DIAGNOSIS — I10 Essential (primary) hypertension: Secondary | ICD-10-CM | POA: Diagnosis not present

## 2020-10-11 DIAGNOSIS — I5023 Acute on chronic systolic (congestive) heart failure: Secondary | ICD-10-CM | POA: Diagnosis not present

## 2020-10-11 DIAGNOSIS — F0390 Unspecified dementia without behavioral disturbance: Secondary | ICD-10-CM | POA: Diagnosis not present

## 2020-10-11 DIAGNOSIS — R531 Weakness: Secondary | ICD-10-CM | POA: Diagnosis not present

## 2020-10-11 DIAGNOSIS — E039 Hypothyroidism, unspecified: Secondary | ICD-10-CM | POA: Diagnosis not present

## 2020-10-11 DIAGNOSIS — I10 Essential (primary) hypertension: Secondary | ICD-10-CM | POA: Diagnosis not present

## 2020-10-11 DIAGNOSIS — E1165 Type 2 diabetes mellitus with hyperglycemia: Secondary | ICD-10-CM | POA: Diagnosis not present

## 2020-10-11 DIAGNOSIS — E7800 Pure hypercholesterolemia, unspecified: Secondary | ICD-10-CM | POA: Diagnosis not present

## 2020-10-11 DIAGNOSIS — D649 Anemia, unspecified: Secondary | ICD-10-CM | POA: Diagnosis not present

## 2020-10-11 DIAGNOSIS — E538 Deficiency of other specified B group vitamins: Secondary | ICD-10-CM | POA: Diagnosis not present

## 2020-10-11 DIAGNOSIS — I700 Atherosclerosis of aorta: Secondary | ICD-10-CM | POA: Diagnosis not present

## 2020-11-08 DIAGNOSIS — E038 Other specified hypothyroidism: Secondary | ICD-10-CM | POA: Diagnosis not present

## 2020-12-01 DIAGNOSIS — M25561 Pain in right knee: Secondary | ICD-10-CM | POA: Diagnosis not present

## 2020-12-01 DIAGNOSIS — M170 Bilateral primary osteoarthritis of knee: Secondary | ICD-10-CM | POA: Diagnosis not present

## 2020-12-01 DIAGNOSIS — M25562 Pain in left knee: Secondary | ICD-10-CM | POA: Diagnosis not present

## 2020-12-01 DIAGNOSIS — R531 Weakness: Secondary | ICD-10-CM | POA: Diagnosis not present

## 2020-12-06 ENCOUNTER — Other Ambulatory Visit: Payer: Self-pay

## 2020-12-06 ENCOUNTER — Emergency Department
Admission: EM | Admit: 2020-12-06 | Discharge: 2020-12-06 | Disposition: A | Discharge status: Home / Self Care | Payer: PPO | Attending: Emergency Medicine | Admitting: Emergency Medicine

## 2020-12-06 DIAGNOSIS — Z7982 Long term (current) use of aspirin: Secondary | ICD-10-CM | POA: Insufficient documentation

## 2020-12-06 DIAGNOSIS — F0390 Unspecified dementia without behavioral disturbance: Secondary | ICD-10-CM | POA: Insufficient documentation

## 2020-12-06 DIAGNOSIS — E039 Hypothyroidism, unspecified: Secondary | ICD-10-CM | POA: Diagnosis not present

## 2020-12-06 DIAGNOSIS — Z91040 Latex allergy status: Secondary | ICD-10-CM | POA: Insufficient documentation

## 2020-12-06 DIAGNOSIS — Z8673 Personal history of transient ischemic attack (TIA), and cerebral infarction without residual deficits: Secondary | ICD-10-CM | POA: Insufficient documentation

## 2020-12-06 DIAGNOSIS — I5023 Acute on chronic systolic (congestive) heart failure: Secondary | ICD-10-CM | POA: Diagnosis not present

## 2020-12-06 DIAGNOSIS — R030 Elevated blood-pressure reading, without diagnosis of hypertension: Secondary | ICD-10-CM | POA: Diagnosis present

## 2020-12-06 DIAGNOSIS — Z7984 Long term (current) use of oral hypoglycemic drugs: Secondary | ICD-10-CM | POA: Insufficient documentation

## 2020-12-06 DIAGNOSIS — E119 Type 2 diabetes mellitus without complications: Secondary | ICD-10-CM | POA: Insufficient documentation

## 2020-12-06 DIAGNOSIS — Z79899 Other long term (current) drug therapy: Secondary | ICD-10-CM | POA: Diagnosis not present

## 2020-12-06 DIAGNOSIS — I10 Essential (primary) hypertension: Secondary | ICD-10-CM

## 2020-12-06 DIAGNOSIS — Z87891 Personal history of nicotine dependence: Secondary | ICD-10-CM | POA: Diagnosis not present

## 2020-12-06 DIAGNOSIS — I110 Hypertensive heart disease with heart failure: Secondary | ICD-10-CM | POA: Diagnosis not present

## 2020-12-06 DIAGNOSIS — I2510 Atherosclerotic heart disease of native coronary artery without angina pectoris: Secondary | ICD-10-CM | POA: Diagnosis not present

## 2020-12-06 DIAGNOSIS — R0902 Hypoxemia: Secondary | ICD-10-CM | POA: Diagnosis not present

## 2020-12-06 LAB — CBC WITH DIFFERENTIAL/PLATELET
Abs Immature Granulocytes: 0.07 10*3/uL (ref 0.00–0.07)
Basophils Absolute: 0.1 10*3/uL (ref 0.0–0.1)
Basophils Relative: 1 %
Eosinophils Absolute: 0.4 10*3/uL (ref 0.0–0.5)
Eosinophils Relative: 3 %
HCT: 43.8 % (ref 36.0–46.0)
Hemoglobin: 13.8 g/dL (ref 12.0–15.0)
Immature Granulocytes: 1 %
Lymphocytes Relative: 46 %
Lymphs Abs: 7.3 10*3/uL — ABNORMAL HIGH (ref 0.7–4.0)
MCH: 30.4 pg (ref 26.0–34.0)
MCHC: 31.5 g/dL (ref 30.0–36.0)
MCV: 96.5 fL (ref 80.0–100.0)
Monocytes Absolute: 1.1 10*3/uL — ABNORMAL HIGH (ref 0.1–1.0)
Monocytes Relative: 7 %
Neutro Abs: 6.4 10*3/uL (ref 1.7–7.7)
Neutrophils Relative %: 42 %
Platelets: 311 10*3/uL (ref 150–400)
RBC: 4.54 MIL/uL (ref 3.87–5.11)
RDW: 14.7 % (ref 11.5–15.5)
WBC: 15.2 10*3/uL — ABNORMAL HIGH (ref 4.0–10.5)
nRBC: 0 % (ref 0.0–0.2)

## 2020-12-06 LAB — TROPONIN I (HIGH SENSITIVITY): Troponin I (High Sensitivity): 14 ng/L (ref <18)

## 2020-12-06 LAB — BASIC METABOLIC PANEL
Anion gap: 11 (ref 5–15)
BUN: 21 mg/dL (ref 8–23)
CO2: 25 mmol/L (ref 22–32)
Calcium: 9.3 mg/dL (ref 8.9–10.3)
Chloride: 96 mmol/L — ABNORMAL LOW (ref 98–111)
Creatinine, Ser: 1.01 mg/dL — ABNORMAL HIGH (ref 0.44–1.00)
GFR, Estimated: 50 mL/min — ABNORMAL LOW (ref >60)
Glucose, Bld: 132 mg/dL — ABNORMAL HIGH (ref 70–99)
Potassium: 4.9 mmol/L (ref 3.5–5.1)
Sodium: 132 mmol/L — ABNORMAL LOW (ref 135–145)

## 2020-12-06 NOTE — ED Triage Notes (Signed)
Pt arrived via [facility] from home with c/o HTN x a few days. Pt denies any other complaints at this time.   Per EMS, pt given losartan at around 1800 today, and has been taking BP meds as prescribed.   Pt denies any recent falls, states she was seen recently for knee injury.   Per EMS, last BP 290/120, all other VSS  Pt A&Ox4, ambulatory from EMS stretcher to ED stretcher at this time.

## 2020-12-06 NOTE — ED Provider Notes (Signed)
[facility] Emergency Department Provider Note   ____________________________________________   I have reviewed the triage vital signs and the nursing notes.   HISTORY  Chief Complaint Hypertension   History limited by: Not Limited   HPI Melinda Lewis is a 87 y.o. female who presents to the emergency department today because of concern for high blood pressure. Patient is here with family/caregiver. The patient did complain of feeling unwell which is what prompted the blood pressure check. The patient however was unable to verbalize how she was feeling bad. When they checked her blood pressure it was found to be elevated. She did take extra hydralazine and losartan at home. She denies any chest pain. Denies any headache. No shortness of breath. Does have some chronic right knee pain.   Records reviewed. Per medical record review patient has a history of DM, HTN.   Past Medical History:  Diagnosis Date  . Acute on chronic systolic CHF (congestive heart failure) (HCC) 06/16/2014  . Anemia   . CAD (coronary artery disease) 8/15   s/p DES LCX and LAD   . Cardiomyopathy, ischemic 06/16/2014  . Diabetes mellitus without complication (HCC)   . Hypertension   . NSTEMI (non-ST elevated myocardial infarction) (HCC)   . Thyroid disease   . TIA (transient ischemic attack) 2010    Patient Active Problem List   Diagnosis Date Noted  . Dementia without behavioral disturbance (HCC)   . Goals of care, counseling/discussion   . Palliative care encounter   . Delirium 03/25/2019  . Expressive dysphasia   . Altered mental status   . TIA (transient ischemic attack) 03/21/2019  . AKI (acute kidney injury) (HCC) 09/05/2018  . Diabetes mellitus without complication (HCC) 10/22/2017  . 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 Aortic atherosclerosis (HCC) 10/22/2017  . Carotid disease, bilateral (HCC) 10/22/2017  . Uncontrolled hypertension 09/09/2017  . Nausea & vomiting 09/08/2017   . Chronic systolic CHF (congestive heart failure) (HCC) 03/04/2015  . Acute on chronic systolic CHF (congestive heart failure) (HCC) 06/16/2014  . Cardiomyopathy, ischemic 06/16/2014  . CAD, multiple vessel 06/15/2014  . Anemia 05/05/2014  . Hypothyroidism 05/05/2014  . 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/05/2014  . NSTEMI (non-ST elevated myocardial infarction) (HCC) 05/02/2014  . Hypertension 05/02/2014  . Pulmonary edema cardiac cause (HCC) 05/02/2014    Past Surgical History:  Procedure Laterality Date  . LEFT HEART CATHETERIZATION WITH CORONARY ANGIOGRAM N/A 05/03/2014   Procedure: LEFT HEART CATHETERIZATION WITH CORONARY ANGIOGRAM;  Surgeon: Peter M Swaziland, MD;  Location: [facility] CATH LAB;  Service: Cardiovascular;  Laterality: N/A;  . PERCUTANEOUS CORONARY STENT INTERVENTION (PCI-S) Right 05/03/2014   Procedure: PERCUTANEOUS CORONARY STENT INTERVENTION (PCI-S);  Surgeon: Peter M Swaziland, MD;  Location: [facility] CATH LAB;  Service: Cardiovascular;  Laterality: Right;  . PERCUTANEOUS CORONARY STENT INTERVENTION (PCI-S) N/A 05/04/2014   Procedure: PERCUTANEOUS CORONARY STENT INTERVENTION (PCI-S);  Surgeon: Peter M Swaziland, MD;  Location: [facility] CATH LAB;  Service: Cardiovascular;  Laterality: N/A;    Prior to Admission medications   Medication Sig Start Date End Date Taking? Authorizing Provider  aspirin EC 81 MG tablet Take 81 mg by mouth daily.    [provider]  donepezil (ARICEPT) 5 MG tablet TAKE ONE TABLET BY MOUTH EVERY DAY WITH SUPPER 04/07/19   Marguarite Arbour, MD  Flaxseed, Linseed, (BIO-FLAX) 1000 MG CAPS Take 1,000 mg by mouth daily at 10 pm.    [provider]  hydrALAZINE (APRESOLINE) 50 MG tablet Take 0.5  tablets (25 mg total) by mouth 3 (three) times daily. Patient taking differently: Take 50 mg by mouth as needed.  03/27/19 01/12/20  Marthenia Rolling, DO  levETIRAcetam (KEPPRA) 500 MG tablet Take 1 tablet (500 mg total) by mouth 2 (two) times daily. 03/27/19   Marthenia Rolling, DO  levothyroxine (SYNTHROID) 100 MCG tablet Take 100 mcg by mouth daily before breakfast.    [provider]  lisinopril (PRINIVIL,ZESTRIL) 40 MG tablet Take 1 tablet (40 mg total) by mouth daily. 09/13/17   Milagros Loll, MD  loratadine (CLARITIN) 10 MG tablet Take 10 mg by mouth daily at 6 PM.    [provider]  losartan (COZAAR) 50 MG tablet Take 50 mg by mouth 2 (two) times daily. 05/04/19 05/03/20  [provider]  magnesium oxide (MAG-OX) 400 MG tablet Take 400 mg by mouth 2 (two) times daily.     [provider]  Melatonin 5 MG TABS Take 5 mg by mouth at bedtime.     [provider]  metFORMIN (GLUCOPHAGE) 500 MG tablet Take 500 mg by mouth 2 (two) times daily. 01/18/20   [provider]  metoprolol succinate (TOPROL-XL) 25 MG 24 hr tablet TAKE ONE TABLET BY MOUTH EVERY DAY Patient taking differently: Take 25 mg by mouth daily.  12/09/18   Azalee Course, PA  nitroGLYCERIN (NITROSTAT) 0.4 MG SL tablet Place 1 tablet (0.4 mg total) under the tongue every 5 (five) minutes as needed for chest pain. 11/25/17 01/12/20  Swaziland, Peter M, MD  ondansetron (ZOFRAN ODT) 4 MG disintegrating tablet Take 1 tablet (4 mg total) by mouth every 8 (eight) hours as needed for nausea or vomiting. 09/19/18   Emily Filbert, MD  polyethylene glycol (MIRALAX / GLYCOLAX) 17 g packet Take 17 g by mouth daily as needed for mild constipation or moderate constipation. 03/24/19   Jackelyn Poling, DO  QUEtiapine (SEROQUEL) 25 MG tablet Take 25 mg by mouth at bedtime.  12/17/16   [provider]    Allergies Clonidine derivatives, Cefuroxime axetil, Septra [sulfamethoxazole-trimethoprim], Whiskey [alcohol], Epinephrine, Latex, Penicillins, and Tape  No family history on file.  Social History Social History   Tobacco Use  . Smoking status: Former Smoker    Types: Cigarettes  . Smokeless tobacco: Never Used  Vaping Use  . Vaping Use: Never used  Substance  Use Topics  . Alcohol use: No  . Drug use: No    Review of Systems Constitutional: No fever/chills Eyes: No visual changes. ENT: No sore throat. Cardiovascular: Denies chest pain. Respiratory: Denies shortness of breath. Gastrointestinal: No abdominal pain.  No nausea, no vomiting.  No diarrhea.   Genitourinary: Negative for dysuria. Musculoskeletal: Right knee pain.  Skin: Negative for rash. Neurological: Negative for headaches, focal weakness or numbness.  ____________________________________________   PHYSICAL EXAM:  VITAL SIGNS: ED Triage Vitals  Enc Vitals Group     BP 12/06/20 1914 (!) 165/101     Pulse Rate 12/06/20 1914 90     Resp 12/06/20 1914 18     Temp 12/06/20 1915 98.2 F (36.8 C)     Temp Source 12/06/20 1915 Oral     SpO2 12/06/20 1914 95 %     Weight 12/06/20 1911 175 lb (79.4 kg)     Height 12/06/20 1911 5\' 2"  (1.575 m)     Head Circumference --      Peak Flow --      Pain Score 12/06/20 1910 0   Constitutional: Alert  and oriented.  Eyes: Conjunctivae are normal.  ENT      Head: Normocephalic and atraumatic.      Nose: No congestion/rhinnorhea.      Mouth/Throat: Mucous membranes are moist.      Neck: No stridor. Hematological/Lymphatic/Immunilogical: No cervical lymphadenopathy. Cardiovascular: Normal rate, regular rhythm.  No murmurs, rubs, or gallops.  Respiratory: Normal respiratory effort without tachypnea nor retractions. Breath sounds are clear and equal bilaterally. No wheezes/rales/rhonchi. Gastrointestinal: Soft and non tender. No rebound. No guarding.  Genitourinary: Deferred Musculoskeletal: Normal range of motion in all extremities. No lower extremity edema. Neurologic:  Normal speech and language. No gross focal neurologic deficits are appreciated.  Skin:  Skin is warm, dry and intact. No rash noted. Psychiatric: Mood and affect are normal. Speech and behavior are normal. Patient exhibits appropriate insight and  judgment.  ____________________________________________    LABS (pertinent positives/negatives)  CBC wbc 15.2, hgb 13.8, plt 311 Trop hs 14 BMP na 132, k 4.9, glu 132, cr 1.01  ____________________________________________   EKG  I, Phineas Semen, attending physician, personally viewed and interpreted this EKG  EKG Time: 1915 Rate: 87 Rhythm: sinus rhythm Axis: left axis deviation Intervals: qtc 460 QRS: narrow ST changes: no st elevation Impression: abnormal ekg  ____________________________________________    RADIOLOGY  None  ____________________________________________   PROCEDURES  Procedures  ____________________________________________   INITIAL IMPRESSION / ASSESSMENT AND PLAN / ED COURSE  Pertinent labs & imaging results that were available during my care of the patient were reviewed by me and considered in my medical decision making (see chart for details).   Patient presented to the emergency department today because of concerns for elevated blood pressure.  She did state that she fell about a however was not able to exactly explain what she meant by this.  The patient blood work without any concerning findings.  Mild elevation of her creatinine.  Blood pressure here in the emergency department was certainly improved over what was reported.  Troponin was normal.  At this time given improved blood pressure and reassuring work-up I do think is reasonable for patient be discharged home.  Discussed results and plan with patient.   ____________________________________________   FINAL CLINICAL IMPRESSION(S) / ED DIAGNOSES  Final diagnoses:  Hypertension, unspecified type     Note: This dictation was prepared with Dragon dictation. Any transcriptional errors that result from this process are unintentional     Phineas Semen, MD 12/06/20 2328

## 2020-12-06 NOTE — ED Notes (Signed)
Pt verbalized understanding of d/c instructions at this time. Pt denied further questions at this time. Pt assisted to passenger seat of POV at this time, NAD noted, RR even and unlabored at this time.

## 2020-12-06 NOTE — ED Notes (Signed)
Called lab to confirm lab work was received. Per lab, will run labs at this time.

## 2020-12-06 NOTE — Discharge Instructions (Addendum)
Please seek medical attention for any high fevers, chest pain, shortness of breath, change in behavior, persistent vomiting, bloody stool or any other new or concerning symptoms.  

## 2020-12-07 LAB — PATHOLOGIST SMEAR REVIEW

## 2020-12-09 DIAGNOSIS — M25561 Pain in right knee: Secondary | ICD-10-CM | POA: Diagnosis not present

## 2020-12-12 DIAGNOSIS — I2510 Atherosclerotic heart disease of native coronary artery without angina pectoris: Secondary | ICD-10-CM | POA: Diagnosis not present

## 2020-12-12 DIAGNOSIS — F411 Generalized anxiety disorder: Secondary | ICD-10-CM | POA: Diagnosis not present

## 2020-12-12 DIAGNOSIS — N1832 Chronic kidney disease, stage 3b: Secondary | ICD-10-CM | POA: Diagnosis not present

## 2020-12-12 DIAGNOSIS — I10 Essential (primary) hypertension: Secondary | ICD-10-CM | POA: Diagnosis not present

## 2020-12-14 ENCOUNTER — Telehealth: Payer: Self-pay | Admitting: Cardiology

## 2020-12-14 NOTE — Telephone Encounter (Signed)
Pt c/o BP issue:  1. What are your last 5 BP readings?  216/109 216/97 290/120 160/73 179/89  2. Are you having any other symptoms (ex. Dizziness, headache, blurred vision, passed out)? No   3. What is your BP issue?  Patient's sister states the patient's BP has been extremely elevated.

## 2020-12-14 NOTE — Telephone Encounter (Signed)
Agree  Tahj Lindseth MD, FACC   

## 2020-12-14 NOTE — Telephone Encounter (Signed)
Spoke to patient's sister Melinda Lewis Dr.Jordan advised to continue monitoring B/P and keep appointment scheduled with Joni Reining DNP 12/30/20 at 3:15 pm.Advised to bring all medications and B/P readings to appointment.

## 2020-12-14 NOTE — Telephone Encounter (Signed)
Spoke with the patient's sister who states that the patient's blood pressure was elevated last week (readings below) so they took her to the ER. She has also since seen her PCP and BP has still been a bit elevated. She was advised to follow up with Dr. Swaziland.  Patient's sister states that most recent blood pressure readings that she has are from 3/28: 132/62, 173/91, 179/99.  Patient is no longer taking lisinopril or losartan. She has increased hydralazine to 50 mg in the morning, 50 mg at lunch time, and 100 mg in the evening. She is also taking amlodipine 5 mg daily and metoprolol 25 mg daily.  Patient's sister also states that she has gained some weight but denies any swelling. At times the patient complains of some chest discomfort and sister notes some shortness of breath.  I have made the patient an appointment to see Lorin Picket on 4/15. Patient's sister states that the patient is still asleep today but will monitor her blood pressure today when she gets up and let us know if it remains elevated.

## 2020-12-19 ENCOUNTER — Other Ambulatory Visit: Payer: Self-pay | Admitting: Orthopedic Surgery

## 2020-12-19 ENCOUNTER — Other Ambulatory Visit (HOSPITAL_COMMUNITY): Payer: Self-pay | Admitting: Orthopedic Surgery

## 2020-12-19 DIAGNOSIS — M25561 Pain in right knee: Secondary | ICD-10-CM

## 2020-12-27 NOTE — Progress Notes (Signed)
Cardiology Office Note   Date:  12/30/2020   ID:  DEETYA DROUILLARD, DOB 27-Oct-1917, MRN [id number]  PCP:  Wilford Corner, PA-C  Cardiologist: Dr. Swaziland  No chief complaint on file.    History of Present Illness: Melinda Lewis is a 87 y.o. female who presents for ongoing assessment and management of coronary artery disease, PAD with known stenosis in the SMA and iliac, Hypertension, type 2 diabetes, and hyperlipidemia.  She has a history of an admission in August 2015 with an NSTEMI and acute respiratory failure with hypertension.  Echo revealed an EF of 35% to 40%.  Cardiac catheterization December strained to severe disease in the proximal to mid LAD and mid circumflex along with an 80% RCA disease in the mid vessel.  She ultimately underwent stenting of the mid circumflex and proximal and mid LAD.  Her RCA was managed medically.  On most recent office visit on 01/11/2021 she was seen by Dr. Swaziland, with his notes documenting that she had recurrent TIAs in July 2020 with facial droop word salad and confusion.  MRI, CT of the head and EEG along with echo showed no acute changes.  She was sent home on hospice.  The patient was essentially sedentary per his note sitting in a wheelchair most of the day except for walking to the bathroom.  No changes were made in her medication regimen with the exception of agreement with stopping atorvastatin which was requested by hospice.   She comes today with her sister and daughter who are concerned that her status is worsening.  She is becoming weaker and complaining of pain in her knee and generalized body aches.  She is also having elevations in her blood pressure with some anxiety and refusal to want to bathe or be mobile.  Concerning her knee pain it is specifically located in the right knee not allowing her to bear weight.  She has an MRI scheduled by PCP.  She is no longer on hospice they saw her last in May 2021 stating that she no longer  qualified.  Her sister is overwhelmed with having to care for her as she is not wanting to adhere to some treatment, or hygiene.  Blood pressure has been rising into the 170s and 190 systolic according to her sisters recordings.  She did have one episode where her blood pressure was low however, the majority of the time it remains elevated.    Past Medical History:  Diagnosis Date  . Acute on chronic systolic CHF (congestive heart failure) (HCC) 06/16/2014  . Anemia   . CAD (coronary artery disease) 8/15   s/p DES LCX and LAD   . Cardiomyopathy, ischemic 06/16/2014  . Diabetes mellitus without complication (HCC)   . Hypertension   . NSTEMI (non-ST elevated myocardial infarction) (HCC)   . Thyroid disease   . TIA (transient ischemic attack) 2010    Past Surgical History:  Procedure Laterality Date  . LEFT HEART CATHETERIZATION WITH CORONARY ANGIOGRAM N/A 05/03/2014   Procedure: LEFT HEART CATHETERIZATION WITH CORONARY ANGIOGRAM;  Surgeon: Peter M Swaziland, MD;  Location: [facility] CATH LAB;  Service: Cardiovascular;  Laterality: N/A;  . PERCUTANEOUS CORONARY STENT INTERVENTION (PCI-S) Right 05/03/2014   Procedure: PERCUTANEOUS CORONARY STENT INTERVENTION (PCI-S);  Surgeon: Peter M Swaziland, MD;  Location: [facility] CATH LAB;  Service: Cardiovascular;  Laterality: Right;  . PERCUTANEOUS CORONARY STENT INTERVENTION (PCI-S) N/A 05/04/2014   Procedure: PERCUTANEOUS CORONARY STENT INTERVENTION (PCI-S);  Surgeon: Peter M Swaziland, MD;  Location: [facility] CATH LAB;  Service: Cardiovascular;  Laterality: N/A;     Current Outpatient Medications  Medication Sig Dispense Refill  . amLODipine (NORVASC) 10 MG tablet Take 1 tablet (10 mg total) by mouth daily. 90 tablet 3  . Digestive Enzymes (ENZYME DIGEST) CAPS Take by mouth.    . escitalopram (LEXAPRO) 5 MG tablet Take by mouth.    . Flaxseed, Linseed, (BIO-FLAX) 1000 MG CAPS Take 1,000 mg by mouth daily at 10 pm.    . hydrALAZINE (APRESOLINE) 50 MG tablet Take by mouth.     . levETIRAcetam (KEPPRA) 500 MG tablet Take 1 tablet (500 mg total) by mouth 2 (two) times daily. 60 tablet 0  . levothyroxine (SYNTHROID) 137 MCG tablet Take 137 mcg by mouth every morning.    . loratadine (CLARITIN) 10 MG tablet Take 10 mg by mouth daily at 6 PM.    . magnesium oxide (MAG-OX) 400 MG tablet Take 400 mg by mouth 2 (two) times daily.     . Melatonin 5 MG TABS Take 5 mg by mouth at bedtime.     . metFORMIN (GLUCOPHAGE) 500 MG tablet Take 500 mg by mouth 2 (two) times daily.    . metoprolol succinate (TOPROL-XL) 25 MG 24 hr tablet TAKE ONE TABLET BY MOUTH EVERY DAY 90 tablet 3  . nitroGLYCERIN (NITROSTAT) 0.4 MG SL tablet Place 1 tablet (0.4 mg total) under the tongue every 5 (five) minutes as needed for chest pain. 90 tablet 3  . ondansetron (ZOFRAN ODT) 4 MG disintegrating tablet Take 1 tablet (4 mg total) by mouth every 8 (eight) hours as needed for nausea or vomiting. 20 tablet 0  . polyethylene glycol (MIRALAX / GLYCOLAX) 17 g packet Take 17 g by mouth daily as needed for mild constipation or moderate constipation. 14 each 0  . QUEtiapine (SEROQUEL) 25 MG tablet Take 25 mg by mouth at bedtime.      No current facility-administered medications for this visit.    Allergies:   Clonidine derivatives, Cefuroxime axetil, Septra [sulfamethoxazole-trimethoprim], Whiskey [alcohol], Epinephrine, Latex, Penicillins, and Tape    Social History:  The patient  reports that she has quit smoking. Her smoking use included cigarettes. She has never used smokeless tobacco. She reports that she does not drink alcohol and does not use drugs.   Family History:  The patient's family history is not on file.    ROS: All other systems are reviewed and negative. Unless otherwise mentioned in H&P    PHYSICAL EXAM: VS:  BP (!) 160/62 (BP Location: Left Arm, Patient Position: Sitting, Cuff Size: Normal)   Pulse 88   Ht 5\' 2"  (1.575 m)   Wt 174 lb (78.9 kg)   BMI 31.83 kg/m  , BMI Body mass  index is 31.83 kg/m. GEN: Well nourished, well developed, in no acute distress HEENT: normal Neck: no JVD, carotid bruits, or masses Cardiac: RRR; 1/6 systolic murmurs, rubs, or gallops,no edema  Respiratory:  Clear to auscultation bilaterally, normal work of breathing GI: soft, nontender, nondistended, + BS MS: no deformity or atrophy, pain in right knee with range of motion. Skin: warm and dry, no rash Neuro: Reduced strength and sensation are intact Psych: euthymic mood, full affect, issues with memory and concentration  EKG: Not completed this office visit  Recent Labs: 12/06/2020: BUN 21; Creatinine, Ser 1.01; Hemoglobin 13.8; Platelets 311; Potassium 4.9; Sodium 132    Lipid Panel    Component Value Date/Time   CHOL 107 03/22/2019 0542  TRIG 135 2019/04/06 0542   HDL 33 (L) Apr 06, 2019 0542   CHOLHDL 3.2 06-Apr-2019 0542   VLDL 27 04-06-2019 0542   LDLCALC 47 2019-04-06 0542      Wt Readings from Last 3 Encounters:  12/30/20 174 lb (78.9 kg)  12/06/20 175 lb (79.4 kg)  04/19/20 160 lb (72.6 kg)      Other studies Reviewed: Echocardiogram 06-Apr-2019 1. The left ventricle has normal systolic function with an ejection  fraction of 60-65%. The cavity size was normal. There is mildly increased  left ventricular wall thickness. Left ventricular diastolic Doppler  parameters are consistent with impaired  relaxation.  2. The right ventricle has normal systolic function. The cavity was  normal.  3. The mitral valve is grossly normal. There is mild mitral annular  calcification present.  4. The tricuspid valve is grossly normal.  5. The aortic valve is tricuspid. Mild thickening of the aortic valve. No  stenosis of the aortic valve.  6. Normal LV systolic function; mild LVH; mild diastolic dysfunction.    ASSESSMENT AND PLAN:  1.  Coronary artery disease: She is at end-stage and does not require any invasive treatment only medical management.  She has been taken  off of statin therapy.  No further treatment is planned with the exception of symptom management.  Currently she is asymptomatic from cardiac standpoint.  She will not need to return to the office.  2.  Difficult to control hypertension: Likely related to age, anxiety, and pain.  Blood pressure has been elevated in the 170s to 190s systolic per her sisters blood pressure recordings.  The patient and her sister do not wish to have to come to office appointments because of elevation in blood pressure and wish to be seen at home.  I have requested Remote Health consultation to manage blood pressure control at home.  They may also evaluate her need for any further assistance concerning ADL's.  Amlodipine will be increased to 10 mg daily.   3. End of Life Decisions: There was a dilemma here as her sister wishes for her to go back on hospice but she apparently does not meet criteria any longer.  The main goal is to have assistance at home in her care and pain management.  She will be referred to Hospice but may not qualify however having remote health available to see her and manage blood pressure and any other assistance that they feel would be necessary with referrals once they have evaluated her.  It may be possible for her to have palliative care.  I would like this to be managed by her primary care physician as they are in a better vantage point to oversee of overall health situation and need for referrals.   Current medicines are reviewed at length with the patient today.  I have spent 45 minutes dedicated to the care of this patient on the date of this encounter to include pre-visit review of records, assessment, management and diagnostic testing,with shared decision making.  Labs/ tests ordered today include: None Bettey Mare. Liborio Nixon, ANP, AACC   12/30/2020 4:49 PM    [facility] [address] Office [phone] Fax [phone]  Notice: This dictation  was prepared with Dragon dictation along with smaller phrase technology. Any transcriptional errors that result from this process are unintentional and may not be corrected upon review.

## 2020-12-30 ENCOUNTER — Ambulatory Visit: Payer: PPO | Admitting: Adult Health

## 2020-12-30 ENCOUNTER — Other Ambulatory Visit: Payer: Self-pay

## 2020-12-30 ENCOUNTER — Encounter: Payer: Self-pay | Admitting: Adult Health

## 2020-12-30 ENCOUNTER — Telehealth: Payer: Self-pay | Admitting: *Deleted

## 2020-12-30 VITALS — BP 160/62 | HR 88 | Ht 62.0 in | Wt 174.0 lb

## 2020-12-30 DIAGNOSIS — F0390 Unspecified dementia without behavioral disturbance: Secondary | ICD-10-CM | POA: Diagnosis not present

## 2020-12-30 DIAGNOSIS — I25118 Atherosclerotic heart disease of native coronary artery with other forms of angina pectoris: Secondary | ICD-10-CM | POA: Diagnosis not present

## 2020-12-30 DIAGNOSIS — I10 Essential (primary) hypertension: Secondary | ICD-10-CM

## 2020-12-30 MED ORDER — AMLODIPINE BESYLATE 10 MG PO TABS: 10.0000 mg | ORAL_TABLET | Freq: Every day | ORAL | 3 refills | Status: DC

## 2020-12-30 NOTE — Patient Instructions (Signed)
Medication Instructions:  INCREASE- Amlodipine 10 mg by mouth daily  *If you need a refill on your cardiac medications before your next appointment, please call your pharmacy*   Lab Work: None Ordered   Testing/Procedures: None Ordered   Follow-Up: At [facility], you and your health needs are our priority.  As part of our continuing mission to provide you with exceptional heart care, we have created designated Provider Care Teams.  These Care Teams include your primary Cardiologist (physician) and Advanced Practice Providers (APPs -  Physician Assistants and Nurse Practitioners) who all work together to provide you with the care you need, when you need it.  We recommend signing up for the patient portal called "MyChart".  Sign up information is provided on this After Visit Summary.  MyChart is used to connect with patients for Virtual Visits (Telemedicine).  Patients are able to view lab/test results, encounter notes, upcoming appointments, etc.  Non-urgent messages can be sent to your provider as well.   To learn more about what you can do with MyChart, go to [email or web address].    Your next appointment:   As Needed

## 2020-12-30 NOTE — Telephone Encounter (Signed)
[facility] Home Visit Initial Request  Date of Request (DOR):  December 30, 2020  Requesting Provider:  Joni Reining, DNP    Agency Requested:    [facility] Contact:  Alcide Clever, NP [address] [place], [place] [address] Phone #:  [phone] Fax #:  [phone]  Patient Demographic Information: Name:  Melinda Lewis Age:  87 y.o.   DOB:  04/08/1918  MRN:  [id number]   Address:   [address] [place] [address]   Phone Numbers:   Home Phone [phone]  Mobile [phone]     Emergency Contact Information on File:   Contact Information    Name Relation Home Work Mobile   [place] K Daughter   [phone]   [place] Sarah   [phone]      The above family members may be contacted for information on this patient (review DPR on file):  Yes    Patient Clinical Information:  Primary Care Provider:  Wilford Corner, PA-C  Primary Cardiologist:  Peter Swaziland, MD  Primary Electrophysiologist:  None   Past Medical Hx: Melinda Lewis  has a past medical history of Acute on chronic systolic CHF (congestive heart failure) (HCC) (06/16/2014), Anemia, CAD (coronary artery disease) (8/15), Cardiomyopathy, ischemic (06/16/2014), Diabetes mellitus without complication (HCC), Hypertension, NSTEMI (non-ST elevated myocardial infarction) (HCC), Thyroid disease, and TIA (transient ischemic attack) (2010).   Allergies: She is allergic to clonidine derivatives, cefuroxime axetil, septra [sulfamethoxazole-trimethoprim], whiskey [alcohol], epinephrine, latex, penicillins, and tape.   Medications: Current Outpatient Medications on File Prior to Visit  Medication Sig  . amLODipine (NORVASC) 10 MG tablet Take 1 tablet (10 mg total) by mouth daily.  . Digestive Enzymes (ENZYME DIGEST) CAPS Take by mouth.  . escitalopram (LEXAPRO) 5 MG tablet Take by mouth.  . Flaxseed, Linseed, (BIO-FLAX) 1000 MG CAPS Take  1,000 mg by mouth daily at 10 pm.  . hydrALAZINE (APRESOLINE) 50 MG tablet Take by mouth.  . levETIRAcetam (KEPPRA) 500 MG tablet Take 1 tablet (500 mg total) by mouth 2 (two) times daily.  Marland Kitchen levothyroxine (SYNTHROID) 137 MCG tablet Take 137 mcg by mouth every morning.  . loratadine (CLARITIN) 10 MG tablet Take 10 mg by mouth daily at 6 PM.  . magnesium oxide (MAG-OX) 400 MG tablet Take 400 mg by mouth 2 (two) times daily.   . Melatonin 5 MG TABS Take 5 mg by mouth at bedtime.   . metFORMIN (GLUCOPHAGE) 500 MG tablet Take 500 mg by mouth 2 (two) times daily.  . metoprolol succinate (TOPROL-XL) 25 MG 24 hr tablet TAKE ONE TABLET BY MOUTH EVERY DAY  . nitroGLYCERIN (NITROSTAT) 0.4 MG SL tablet Place 1 tablet (0.4 mg total) under the tongue every 5 (five) minutes as needed for chest pain.  Marland Kitchen ondansetron (ZOFRAN ODT) 4 MG disintegrating tablet Take 1 tablet (4 mg total) by mouth every 8 (eight) hours as needed for nausea or vomiting.  . polyethylene glycol (MIRALAX / GLYCOLAX) 17 g packet Take 17 g by mouth daily as needed for mild constipation or moderate constipation.  . QUEtiapine (SEROQUEL) 25 MG tablet Take 25 mg by mouth at bedtime.    No current facility-administered medications on file prior to visit.     Social Hx: She  reports that she has quit smoking. Her smoking use included cigarettes. She has never used smokeless tobacco. She reports that she does not drink alcohol and does not use drugs.    Diagnosis/Reason  for Visit:   Routine blood pressure check  Services Requested:  Vital Signs (BP, Pulse, O2, Weight)  Assess for Other Rehab/Nursing Needs  # of Visits Needed/Frequency per Week: 1-2 times

## 2021-01-02 ENCOUNTER — Ambulatory Visit
Admission: RE | Admit: 2021-01-02 | Discharge: 2021-01-02 | Disposition: A | Discharge status: Home / Self Care | Payer: PPO | Source: Ambulatory Visit | Attending: Orthopedic Surgery | Admitting: Orthopedic Surgery

## 2021-01-02 ENCOUNTER — Other Ambulatory Visit: Payer: Self-pay

## 2021-01-02 DIAGNOSIS — M25561 Pain in right knee: Secondary | ICD-10-CM

## 2021-02-02 DIAGNOSIS — S83203A Other tear of unspecified meniscus, current injury, right knee, initial encounter: Secondary | ICD-10-CM | POA: Diagnosis not present

## 2021-05-01 ENCOUNTER — Other Ambulatory Visit: Payer: Self-pay

## 2021-05-01 ENCOUNTER — Emergency Department: Payer: PPO

## 2021-05-01 ENCOUNTER — Emergency Department
Admission: EM | Admit: 2021-05-01 | Discharge: 2021-05-01 | Disposition: A | Discharge status: Home / Self Care | Payer: PPO | Attending: Emergency Medicine | Admitting: Emergency Medicine

## 2021-05-01 DIAGNOSIS — I2510 Atherosclerotic heart disease of native coronary artery without angina pectoris: Secondary | ICD-10-CM | POA: Insufficient documentation

## 2021-05-01 DIAGNOSIS — I5033 Acute on chronic diastolic (congestive) heart failure: Secondary | ICD-10-CM | POA: Diagnosis not present

## 2021-05-01 DIAGNOSIS — Z955 Presence of coronary angioplasty implant and graft: Secondary | ICD-10-CM | POA: Diagnosis not present

## 2021-05-01 DIAGNOSIS — M7989 Other specified soft tissue disorders: Secondary | ICD-10-CM | POA: Diagnosis not present

## 2021-05-01 DIAGNOSIS — E039 Hypothyroidism, unspecified: Secondary | ICD-10-CM | POA: Insufficient documentation

## 2021-05-01 DIAGNOSIS — I110 Hypertensive heart disease with heart failure: Secondary | ICD-10-CM | POA: Insufficient documentation

## 2021-05-01 DIAGNOSIS — Z87891 Personal history of nicotine dependence: Secondary | ICD-10-CM | POA: Diagnosis not present

## 2021-05-01 DIAGNOSIS — Z7984 Long term (current) use of oral hypoglycemic drugs: Secondary | ICD-10-CM | POA: Insufficient documentation

## 2021-05-01 DIAGNOSIS — R2242 Localized swelling, mass and lump, left lower limb: Secondary | ICD-10-CM | POA: Insufficient documentation

## 2021-05-01 DIAGNOSIS — Z79899 Other long term (current) drug therapy: Secondary | ICD-10-CM | POA: Insufficient documentation

## 2021-05-01 DIAGNOSIS — Z9889 Other specified postprocedural states: Secondary | ICD-10-CM | POA: Diagnosis not present

## 2021-05-01 DIAGNOSIS — Z91040 Latex allergy status: Secondary | ICD-10-CM | POA: Insufficient documentation

## 2021-05-01 DIAGNOSIS — Z8673 Personal history of transient ischemic attack (TIA), and cerebral infarction without residual deficits: Secondary | ICD-10-CM | POA: Diagnosis not present

## 2021-05-01 DIAGNOSIS — L03031 Cellulitis of right toe: Secondary | ICD-10-CM | POA: Diagnosis not present

## 2021-05-01 DIAGNOSIS — E119 Type 2 diabetes mellitus without complications: Secondary | ICD-10-CM | POA: Insufficient documentation

## 2021-05-01 LAB — COMPREHENSIVE METABOLIC PANEL
ALT: 21 U/L (ref 0–44)
AST: 26 U/L (ref 15–41)
Albumin: 4.1 g/dL (ref 3.5–5.0)
Alkaline Phosphatase: 45 U/L (ref 38–126)
Anion gap: 7 (ref 5–15)
BUN: 23 mg/dL (ref 8–23)
CO2: 28 mmol/L (ref 22–32)
Calcium: 9.3 mg/dL (ref 8.9–10.3)
Chloride: 100 mmol/L (ref 98–111)
Creatinine, Ser: 1.06 mg/dL — ABNORMAL HIGH (ref 0.44–1.00)
GFR, Estimated: 47 mL/min — ABNORMAL LOW (ref >60)
Glucose, Bld: 133 mg/dL — ABNORMAL HIGH (ref 70–99)
Potassium: 4.7 mmol/L (ref 3.5–5.1)
Sodium: 135 mmol/L (ref 135–145)
Total Bilirubin: 0.5 mg/dL (ref 0.3–1.2)
Total Protein: 8.4 g/dL — ABNORMAL HIGH (ref 6.5–8.1)

## 2021-05-01 LAB — CBC WITH DIFFERENTIAL/PLATELET
Abs Immature Granulocytes: 0.05 10*3/uL (ref 0.00–0.07)
Basophils Absolute: 0.1 10*3/uL (ref 0.0–0.1)
Basophils Relative: 1 %
Eosinophils Absolute: 0.2 10*3/uL (ref 0.0–0.5)
Eosinophils Relative: 1 %
HCT: 42.9 % (ref 36.0–46.0)
Hemoglobin: 14 g/dL (ref 12.0–15.0)
Immature Granulocytes: 0 %
Lymphocytes Relative: 36 %
Lymphs Abs: 4.8 10*3/uL — ABNORMAL HIGH (ref 0.7–4.0)
MCH: 31.5 pg (ref 26.0–34.0)
MCHC: 32.6 g/dL (ref 30.0–36.0)
MCV: 96.4 fL (ref 80.0–100.0)
Monocytes Absolute: 1 10*3/uL (ref 0.1–1.0)
Monocytes Relative: 8 %
Neutro Abs: 7 10*3/uL (ref 1.7–7.7)
Neutrophils Relative %: 54 %
Platelets: 316 10*3/uL (ref 150–400)
RBC: 4.45 MIL/uL (ref 3.87–5.11)
RDW: 14.6 % (ref 11.5–15.5)
WBC: 13.2 10*3/uL — ABNORMAL HIGH (ref 4.0–10.5)
nRBC: 0 % (ref 0.0–0.2)

## 2021-05-01 MED ORDER — CEPHALEXIN 500 MG PO CAPS: 500.0000 mg | ORAL_CAPSULE | Freq: Three times a day (TID) | ORAL | 0 refills | Status: DC

## 2021-05-01 NOTE — ED Provider Notes (Signed)
[facility] Emergency Department Provider Note  ____________________________________________  Time seen: Approximately 5:19 PM  I have reviewed the triage vital signs and the nursing notes.   HISTORY  Chief Complaint Toe Pain    HPI Melinda Lewis is a 87 y.o. female with a history of CHF CAD diabetes hypertension who comes ED complaining of of pain and swelling of the left great toe.  She thinks that the nail was caught on a sheet, causing it to tear and bleed.  Denies any leg pain fevers chills chest pain or shortness of breath.  Pain is intermittent, no aggravating or alleviating factors, nonradiating.  She tried to make an appointment with podiatry, but did not want a wait 2 weeks so she came to the emergency department.    Past Medical History:  Diagnosis Date   Acute on chronic systolic CHF (congestive heart failure) (HCC) 06/16/2014   Anemia    CAD (coronary artery disease) 8/15   s/p DES LCX and LAD    Cardiomyopathy, ischemic 06/16/2014   Diabetes mellitus without complication (HCC)    Hypertension    NSTEMI (non-ST elevated myocardial infarction) [facility])    Thyroid disease    TIA (transient ischemic attack) 2010     Patient Active Problem List   Diagnosis Date Noted   Dementia without behavioral disturbance (HCC)    Goals of care, counseling/discussion    Palliative care encounter    Delirium 03/25/2019   Expressive dysphasia    Altered mental status    TIA (transient ischemic attack) 03/21/2019   AKI (acute kidney injury) (HCC) 09/05/2018   Diabetes mellitus without complication (HCC) 10/22/2017   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   Aortic atherosclerosis (HCC) 10/22/2017   Carotid disease, bilateral (HCC) 10/22/2017   Uncontrolled hypertension 09/09/2017   Nausea & vomiting 09/08/2017   Chronic systolic CHF (congestive heart failure) (HCC) 03/04/2015   Acute on chronic systolic CHF (congestive heart failure) (HCC) 06/16/2014    Cardiomyopathy, ischemic 06/16/2014   CAD, multiple vessel 06/15/2014   Anemia 05/05/2014   Hypothyroidism 05/05/2014   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/05/2014   NSTEMI (non-ST elevated myocardial infarction) (HCC) 05/02/2014   Hypertension 05/02/2014   Pulmonary edema cardiac cause (HCC) 05/02/2014     Past Surgical History:  Procedure Laterality Date   LEFT HEART CATHETERIZATION WITH CORONARY ANGIOGRAM N/A 05/03/2014   Procedure: LEFT HEART CATHETERIZATION WITH CORONARY ANGIOGRAM;  Surgeon: Peter M Swaziland, MD;  Location[facility] CATH LAB;  Service: Cardiovascular;  Laterality: N/A;   PERCUTANEOUS CORONARY STENT INTERVENTION (PCI-S) Right 05/03/2014   Procedure: PERCUTANEOUS CORONARY STENT INTERVENTION (PCI-S);  Surgeon: Peter M Swaziland, MD;  Location: [facility] CATH LAB;  Service: Cardiovascular;  Laterality: Right;   PERCUTANEOUS CORONARY STENT INTERVENTION (PCI-S) N/A 05/04/2014   Procedure: PERCUTANEOUS CORONARY STENT INTERVENTION (PCI-S);  Surgeon: Peter M Swaziland, MD;  Location: [facility] CATH LAB;  Service: Cardiovascular;  Laterality: N/A;     Prior to Admission medications   Medication Sig Start Date End Date Taking? Authorizing Provider  cephALEXin (KEFLEX) 500 MG capsule Take 1 capsule (500 mg total) by mouth 3 (three) times daily. 05/01/21  Yes Sharman Cheek, MD  amLODipine (NORVASC) 10 MG tablet Take 1 tablet (10 mg total) by mouth daily. 12/30/20 03/30/21  Jodelle Gross, NP  Digestive Enzymes (ENZYME DIGEST) CAPS Take by mouth.    [provider]  escitalopram (LEXAPRO) 5 MG tablet Take by mouth. 12/12/20 12/12/21  [provider]  Flaxseed, Linseed, (BIO-FLAX) 1000 MG  CAPS Take 1,000 mg by mouth daily at 10 pm.    [provider]  hydrALAZINE (APRESOLINE) 50 MG tablet Take by mouth. 10/27/20 10/27/21  [provider]  levETIRAcetam (KEPPRA) 500 MG tablet Take 1 tablet (500 mg total) by mouth 2 (two) times daily. 03/27/19   Marthenia Rolling, DO   levothyroxine (SYNTHROID) 137 MCG tablet Take 137 mcg by mouth every morning. 12/14/20   [provider]  loratadine (CLARITIN) 10 MG tablet Take 10 mg by mouth daily at 6 PM.    [provider]  magnesium oxide (MAG-OX) 400 MG tablet Take 400 mg by mouth 2 (two) times daily.     [provider]  Melatonin 5 MG TABS Take 5 mg by mouth at bedtime.     [provider]  metFORMIN (GLUCOPHAGE) 500 MG tablet Take 500 mg by mouth 2 (two) times daily. 01/18/20   [provider]  metoprolol succinate (TOPROL-XL) 25 MG 24 hr tablet TAKE ONE TABLET BY MOUTH EVERY DAY 12/09/18   Azalee Course, PA  nitroGLYCERIN (NITROSTAT) 0.4 MG SL tablet Place 1 tablet (0.4 mg total) under the tongue every 5 (five) minutes as needed for chest pain. 11/25/17 01/12/20  Swaziland, Peter M, MD  ondansetron (ZOFRAN ODT) 4 MG disintegrating tablet Take 1 tablet (4 mg total) by mouth every 8 (eight) hours as needed for nausea or vomiting. 09/19/18   Emily Filbert, MD  polyethylene glycol (MIRALAX / GLYCOLAX) 17 g packet Take 17 g by mouth daily as needed for mild constipation or moderate constipation. 03/24/19   Jackelyn Poling, DO  QUEtiapine (SEROQUEL) 25 MG tablet Take 25 mg by mouth at bedtime.  12/17/16   [provider]     Allergies Clonidine derivatives, Cefuroxime axetil, Septra [sulfamethoxazole-trimethoprim], Whiskey [alcohol], Epinephrine, Latex, Penicillins, and Tape   No family history on file.  Social History Social History   Tobacco Use   Smoking status: Former    Types: Cigarettes   Smokeless tobacco: Never  Vaping Use   Vaping Use: Never used  Substance Use Topics   Alcohol use: No   Drug use: No    Review of Systems  Constitutional:   No fever or chills.   Cardiovascular:   No chest pain or syncope. Respiratory:   No dyspnea or cough. Gastrointestinal:   Negative for abdominal pain, vomiting and diarrhea.  Musculoskeletal: Left toe pain as  above All other systems reviewed and are negative except as documented above in ROS and HPI.  ____________________________________________   PHYSICAL EXAM:  VITAL SIGNS: ED Triage Vitals  Enc Vitals Group     BP 05/01/21 1347 (!) 145/57     Pulse Rate 05/01/21 1347 82     Resp 05/01/21 1347 18     Temp 05/01/21 1347 98.6 F (37 C)     Temp Source 05/01/21 1347 Oral     SpO2 05/01/21 1347 93 %     Weight 05/01/21 1348 175 lb (79.4 kg)     Height 05/01/21 1348 5\' 2"  (1.575 m)     Head Circumference --      Peak Flow --      Pain Score 05/01/21 1406 5     Pain Loc --      Pain Edu? --      Excl. in GC? --     Vital signs reviewed, nursing assessments reviewed.   Constitutional:   Alert and oriented. Non-toxic appearance. Eyes:   Conjunctivae are normal. EOMI.  ENT      Head:   Normocephalic and atraumatic.           Neck:   No meningismus. Full ROM. Cardiovascular:   RRR.  Normal dorsalis pedis pulses. Cap refill less than 2 seconds. Respiratory:   Normal respiratory effort without tachypnea/retractions.  Musculoskeletal:   Minimal erythema of the tip of the left great toe without appreciable swelling.  The toe is nontender.  There is clotted blood under the nail that communicates with the nail tip and extends around the nail margins.  There is no induration or wound or purulent drainage, no paronychia. Neurologic:   Normal speech and language.  Motor grossly intact. No acute focal neurologic deficits are appreciated.  Skin:    Skin is warm, dry and intact. No rash noted.  No wounds.  ____________________________________________    LABS (pertinent positives/negatives) (all labs ordered are listed, but only abnormal results are displayed) Labs Reviewed  COMPREHENSIVE METABOLIC PANEL - Abnormal; Notable for the following components:      Result Value   Glucose, Bld 133 (*)    Creatinine, Ser 1.06 (*)    Total Protein 8.4 (*)    GFR, Estimated 47 (*)    All other  components within normal limits  CBC WITH DIFFERENTIAL/PLATELET - Abnormal; Notable for the following components:   WBC 13.2 (*)    Lymphs Abs 4.8 (*)    All other components within normal limits  URINALYSIS, COMPLETE (UACMP) WITH MICROSCOPIC   ____________________________________________   EKG  ____________________________________________    RADIOLOGY  DG Toe Great Left  Result Date: 05/01/2021 CLINICAL DATA:  Diabetic patient with bloody and black great toe possible infection, no known injury EXAM: LEFT GREAT TOE COMPARISON:  None FINDINGS: Prior bunionectomy in distal osteotomy distal first metatarsal with retained K-wire. Osseous demineralization. Joint spaces preserved. Soft tissue swelling great toe. No acute fracture, dislocation, or bone destruction. No soft tissue gas identified. IMPRESSION: No acute osseous abnormalities. Postsurgical changes distal LEFT first metatarsal. Electronically Signed   By: Ulyses Southward M.D.   On: 05/01/2021 15:28    ____________________________________________   PROCEDURES Procedures  ____________________________________________  CLINICAL IMPRESSION / ASSESSMENT AND PLAN / ED COURSE  Pertinent labs & imaging results that were available during my care of the patient were reviewed by me and considered in my medical decision making (see chart for details).  Melinda Lewis was evaluated in Emergency Department on 05/01/2021 for the symptoms described in the history of present illness. She was evaluated in the context of the global COVID-19 pandemic, which necessitated consideration that the patient might be at risk for infection with the SARS-CoV-2 virus that causes COVID-19. Institutional protocols and algorithms that pertain to the evaluation of patients at risk for COVID-19 are in a state of rapid change based on information released by regulatory bodies including the CDC and federal and state organizations. These policies and algorithms were  followed during the patient's care in the ED.   Patient presents with left toe pain.  This appears to be due to nail injury.  This appears to have happened a few days ago as there is clotted blood under the nail separating it from the skin.  She does not have any tenderness, I doubt cellulitis, but with her age and diabetes, I will prescribe Keflex.  Recommend she see a podiatrist as soon as possible.  I will also encourage her to soak the foot 2 times a day to gently remove debris without scraping  or vigorous rubbing or cutting.      ____________________________________________   FINAL CLINICAL IMPRESSION(S) / ED DIAGNOSES    Final diagnoses:  Localized swelling of toe of right foot     ED Discharge Orders          Ordered    cephALEXin (KEFLEX) 500 MG capsule  3 times daily        05/01/21 1713            Portions of this note were generated with dragon dictation software. Dictation errors may occur despite best attempts at proofreading.   Sharman Cheek, MD 05/01/21 1725

## 2021-05-01 NOTE — Discharge Instructions (Addendum)
Please soak your foot in warm water two times a day for the next week.  Be sure to completely dry the skin afterward. Avoid trying to cut or scrape the clotted blood and nail debris.

## 2021-05-01 NOTE — ED Notes (Signed)
Left toe #1 red, not painful, no s/s of infection.

## 2021-05-01 NOTE — ED Notes (Signed)
Pt family requesting lab to come stick, lab called

## 2021-05-01 NOTE — ED Triage Notes (Signed)
Pt to ED for possible infection to left foot great toe for a couple days Pt diabetic Pt toe black and bloody C/o pain

## 2021-05-04 ENCOUNTER — Other Ambulatory Visit: Payer: Self-pay

## 2021-05-04 ENCOUNTER — Ambulatory Visit (INDEPENDENT_AMBULATORY_CARE_PROVIDER_SITE_OTHER): Payer: PPO | Admitting: Podiatry

## 2021-05-04 DIAGNOSIS — S90212A Contusion of left great toe with damage to nail, initial encounter: Secondary | ICD-10-CM

## 2021-05-04 NOTE — Progress Notes (Signed)
Subjective:  Patient ID: Melinda Lewis, female    DOB: 08/05/1918,  MRN: [id number]  Chief Complaint  Patient presents with   Foot Pain   Nail Problem    Left hallux nail     87 y.o. female presents with the above complaint.  Patient presents with complaint left great toenail contusion.  Patient states he stubbed the toe and the toe was not loose.  The injury happened in July or as progressive gotten worse.  She would like to have the nail removed.  She has not seen anyone else prior to seeing me.  She is a well-controlled diabetic.  She is not on a blood thinner.  She does not have any circulation problem.  She is a hurts with ambulation.   Review of Systems: Negative except as noted in the HPI. Denies N/V/F/Ch.  Past Medical History:  Diagnosis Date   Acute on chronic systolic CHF (congestive heart failure) (HCC) 06/16/2014   Anemia    CAD (coronary artery disease) 8/15   s/p DES LCX and LAD    Cardiomyopathy, ischemic 06/16/2014   Diabetes mellitus without complication (HCC)    Hypertension    NSTEMI (non-ST elevated myocardial infarction) (HCC)    Thyroid disease    TIA (transient ischemic attack) 2010    Current Outpatient Medications:    amLODipine (NORVASC) 10 MG tablet, Take 1 tablet (10 mg total) by mouth daily., Disp: 90 tablet, Rfl: 3   cephALEXin (KEFLEX) 500 MG capsule, Take 1 capsule (500 mg total) by mouth 3 (three) times daily., Disp: 21 capsule, Rfl: 0   Digestive Enzymes (ENZYME DIGEST) CAPS, Take by mouth., Disp: , Rfl:    escitalopram (LEXAPRO) 5 MG tablet, Take by mouth., Disp: , Rfl:    Flaxseed, Linseed, (BIO-FLAX) 1000 MG CAPS, Take 1,000 mg by mouth daily at 10 pm., Disp: , Rfl:    hydrALAZINE (APRESOLINE) 50 MG tablet, Take by mouth., Disp: , Rfl:    levETIRAcetam (KEPPRA) 500 MG tablet, Take 1 tablet (500 mg total) by mouth 2 (two) times daily., Disp: 60 tablet, Rfl: 0   levothyroxine (SYNTHROID) 137 MCG tablet, Take 137 mcg by mouth every morning.,  Disp: , Rfl:    loratadine (CLARITIN) 10 MG tablet, Take 10 mg by mouth daily at 6 PM., Disp: , Rfl:    magnesium oxide (MAG-OX) 400 MG tablet, Take 400 mg by mouth 2 (two) times daily. , Disp: , Rfl:    Melatonin 5 MG TABS, Take 5 mg by mouth at bedtime. , Disp: , Rfl:    metFORMIN (GLUCOPHAGE) 500 MG tablet, Take 500 mg by mouth 2 (two) times daily., Disp: , Rfl:    metoprolol succinate (TOPROL-XL) 25 MG 24 hr tablet, TAKE ONE TABLET BY MOUTH EVERY DAY, Disp: 90 tablet, Rfl: 3   nitroGLYCERIN (NITROSTAT) 0.4 MG SL tablet, Place 1 tablet (0.4 mg total) under the tongue every 5 (five) minutes as needed for chest pain., Disp: 90 tablet, Rfl: 3   ondansetron (ZOFRAN ODT) 4 MG disintegrating tablet, Take 1 tablet (4 mg total) by mouth every 8 (eight) hours as needed for nausea or vomiting., Disp: 20 tablet, Rfl: 0   polyethylene glycol (MIRALAX / GLYCOLAX) 17 g packet, Take 17 g by mouth daily as needed for mild constipation or moderate constipation., Disp: 14 each, Rfl: 0   QUEtiapine (SEROQUEL) 25 MG tablet, Take 25 mg by mouth at bedtime. , Disp: , Rfl:   Social History   Tobacco Use  Smoking Status Former   Types: Cigarettes  Smokeless Tobacco Never    Allergies  Allergen Reactions   Clonidine Derivatives Other (See Comments)    MADE THE PATIENT PASS OUT   Cefuroxime Axetil Nausea Only and Other (See Comments)    Cramps, also   Septra [Sulfamethoxazole-Trimethoprim] Nausea And Vomiting   Whiskey [Alcohol] Hives   Epinephrine Palpitations and Other (See Comments)    Heart races   Latex Rash and Other (See Comments)    Causes sores on the skin   Penicillins Palpitations    Heart races Did it involve swelling of the face/tongue/throat, SOB, or low BP? Unk Did it involve sudden or severe rash/hives, skin peeling, or any reaction on the inside of your mouth or nose? Unk Did you need to seek medical attention at a hospital or doctor's office? Yes When did it last happen? "During [employer]"  (1940's) If all above answers are "NO", may proceed with cephalosporin use.    Tape Rash and Other (See Comments)    Latex tape causes sores on the skin   Objective:  There were no vitals filed for this visit. There is no height or weight on file to calculate BMI. Constitutional Well developed. Well nourished.  Vascular Dorsalis pedis pulses palpable bilaterally. Posterior tibial pulses palpable bilaterally. Capillary refill normal to all digits.  No cyanosis or clubbing noted. Pedal hair growth normal.  Neurologic Normal speech. Oriented to person, place, and time. Epicritic sensation to light touch grossly present bilaterally.  Dermatologic Pain on palpation of the entire/total nail on 1st digit of the left No other open wounds. No skin lesions.  Orthopedic: Normal joint ROM without pain or crepitus bilaterally. No visible deformities. No bony tenderness.   Radiographs: None Assessment:   1. Contusion of left great toe with damage to nail, initial encounter    Plan:  Patient was evaluated and treated and all questions answered.  Nail contusion/dystrophy hallux, left -Patient elects to proceed with minor surgery to remove entire toenail today. Consent reviewed and signed by patient. -Entire/total nail excised. See procedure note. -Educated on post-procedure care including soaking. Written instructions provided and reviewed. -Patient to follow up in 2 weeks for nail check.  Procedure: Excision of entire/total nail  Location: Left 1st toe digit Anesthesia: Lidocaine 1% plain; 1.5 mL and Marcaine 0.5% plain; 1.5 mL, digital block. Skin Prep: Betadine. Dressing: Silvadene; telfa; dry, sterile, compression dressing. Technique: Following skin prep, the toe was exsanguinated and a tourniquet was secured at the base of the toe. The affected nail border was freed and excised. The tourniquet was then removed and sterile dressing applied. Disposition: Patient tolerated procedure  well. Patient to return in 2 weeks for follow-up.   No follow-ups on file.

## 2021-07-21 DIAGNOSIS — G459 Transient cerebral ischemic attack, unspecified: Secondary | ICD-10-CM | POA: Diagnosis not present

## 2021-07-21 DIAGNOSIS — I10 Essential (primary) hypertension: Secondary | ICD-10-CM | POA: Diagnosis not present

## 2021-07-21 DIAGNOSIS — G894 Chronic pain syndrome: Secondary | ICD-10-CM | POA: Diagnosis not present

## 2021-07-21 DIAGNOSIS — I700 Atherosclerosis of aorta: Secondary | ICD-10-CM | POA: Diagnosis not present

## 2021-07-21 DIAGNOSIS — E7800 Pure hypercholesterolemia, unspecified: Secondary | ICD-10-CM | POA: Diagnosis not present

## 2021-07-21 DIAGNOSIS — E039 Hypothyroidism, unspecified: Secondary | ICD-10-CM | POA: Diagnosis not present

## 2021-07-21 DIAGNOSIS — I5023 Acute on chronic systolic (congestive) heart failure: Secondary | ICD-10-CM | POA: Diagnosis not present

## 2021-07-21 DIAGNOSIS — N1832 Chronic kidney disease, stage 3b: Secondary | ICD-10-CM | POA: Diagnosis not present

## 2021-07-21 DIAGNOSIS — Z23 Encounter for immunization: Secondary | ICD-10-CM | POA: Diagnosis not present

## 2021-07-26 DIAGNOSIS — I10 Essential (primary) hypertension: Secondary | ICD-10-CM | POA: Diagnosis not present

## 2021-07-26 DIAGNOSIS — I5023 Acute on chronic systolic (congestive) heart failure: Secondary | ICD-10-CM | POA: Diagnosis not present

## 2021-07-26 DIAGNOSIS — I700 Atherosclerosis of aorta: Secondary | ICD-10-CM | POA: Diagnosis not present

## 2021-07-26 DIAGNOSIS — E7800 Pure hypercholesterolemia, unspecified: Secondary | ICD-10-CM | POA: Diagnosis not present

## 2021-08-09 ENCOUNTER — Telehealth: Payer: Self-pay | Admitting: Student

## 2021-08-09 NOTE — Telephone Encounter (Signed)
Spoke with patient's daughter Dorann Ou, regarding the Palliative referral/services and all questions were answered and she was in agreement with scheduling visit.  I have scheduled an In-home Consult for 08/24/21 @ 12:30 PM

## 2021-08-24 ENCOUNTER — Other Ambulatory Visit: Payer: PPO | Admitting: Student

## 2021-08-24 ENCOUNTER — Other Ambulatory Visit: Payer: Self-pay

## 2021-08-24 DIAGNOSIS — R531 Weakness: Secondary | ICD-10-CM

## 2021-08-24 DIAGNOSIS — I5022 Chronic systolic (congestive) heart failure: Secondary | ICD-10-CM | POA: Diagnosis not present

## 2021-08-24 DIAGNOSIS — Z515 Encounter for palliative care: Secondary | ICD-10-CM | POA: Diagnosis not present

## 2021-08-24 NOTE — Progress Notes (Signed)
[facility] Palliative Care Consult Note Telephone: [phone]  Fax: [phone]   Date of encounter: 08/24/21 12:41 PM PATIENT NAME: Melinda Lewis [address] [place] [address]   [phone] (home)  DOB: 01/11/1918 MRN: [id number] PRIMARY CARE PROVIDER:    Donnamarie Rossetti, PA-C,  [place] [address] [phone]  REFERRING PROVIDER:   Donnamarie Rossetti, PA-C [address] [place],  [place] [address] [phone]  RESPONSIBLE PARTY:    Contact Information     Name Relation Home Work Melinda Lewis Daughter   [phone]   Melinda Lewis   [phone]        I met face to face with patient and family in the home. Daughter Financial controller via telephone. Palliative Care was asked to follow this patient by consultation request of  Melinda Lewis, Melinda Lewis,* to address advance care planning and complex medical decision making. This is the initial visit.                                     ASSESSMENT AND PLAN / RECOMMENDATIONS:   Advance Care Planning/Goals of Care: Goals include to maximize quality of life and symptom management. Patient/health care surrogate gave his/her permission to discuss.Our advance care planning conversation included a discussion about:    The value and importance of advance care planning  Experiences with loved ones who have been seriously ill or have died  Exploration of personal, cultural or spiritual beliefs that might influence medical decisions  Exploration of goals of care in the event of a sudden injury or illness   Review and updating or creation of an  advance directive document . Decision not to resuscitate or to de-escalate disease focused treatments due to poor prognosis. Will complete MOST form on next visit.   CODE STATUS: DNR  Education provided on Palliative Medicine vs hospice services. Family would like for patient to remain in  the home. Will monitor for changes and declines. Will refer to hospice when she meets criteria. Daughter wants to avoid any blood draws. Will go to hospital if needed, unable to manage in the home.   Symptom Management/Plan:  Chronic CHF-patient appears euvolemic today. Will monitor for worsening symptoms of shortness of breath, edema, fatigue, weakness.  Generalized weakness-family to continue providing supportive care. Use rollator for ambulation. Monitor for falls/safety.    Follow up Palliative Care Visit: Palliative care will continue to follow for complex medical decision making, advance care planning, and clarification of goals. Return in 8 weeks or prn.  I spent 45 minutes providing this consultation. More than 50% of the time in this consultation was spent in counseling and care coordination.   PPS: 50%  HOSPICE ELIGIBILITY/DIAGNOSIS: TBD  Chief Complaint: Palliative Medicine initial consult.   HISTORY OF PRESENT ILLNESS:  Melinda Lewis is a 87 y.o. year old female  with hypertension, chronic CHF, CAD, hyperlipidemia, dementia, hypothyroidism, chronic pain syndrome, DDD, OA of right shoulder, diabetes, PVD, anemia, TIA, CDK 3b, NSTEMI.  Patient resides at home; daughter and son in law live in the home. Patient states she is doing well. Daughter Melinda Lewis states patient does require some assistance with adl's. She denies any need for outside support/assistance at this time. Patient denies pain, chest pain, shortness of breath, nausea, constipation. Good appetite endorsed; no weight loss reported. No recent infections reported. No ED  visits or hospitalizations.   Patient is received watching television. Pleasant mood; answers questions. She is slightly hard of hearing. No signs of pain or discomfort. Patient and family deny any needs at present time. Patient's son is unsure of what medications she is actually taking. Attempted to reconcile with what is on file at her PCP.   History  obtained from review of EMR, discussion with primary team, and interview with family, facility staff/caregiver and/or Melinda Lewis.  I reviewed available labs, medications, imaging, studies and related documents from the EMR.  Records reviewed and summarized above.   ROS  General: NAD EYES: denies vision changes ENMT: denies dysphagia Cardiovascular: denies chest pain, denies DOE Pulmonary: denies cough, denies increased SOB Abdomen: endorses good appetite, denies constipation Lewis: denies dysuria MSK:  denies weakness,  no falls reported Skin: denies rashes or wounds Neurological: denies pain, denies insomnia Psych: Endorses positive mood Heme/lymph/immuno: denies bruises, abnormal bleeding  Physical Exam: Pulse 68, resp 16, b/p 120/50, sats 93% on room air Constitutional: NAD General: frail appearing EYES: anicteric sclera, lids intact, no discharge  ENMT: slight hard of hearing, oral mucous membranes moist CV: S1S2, RRR, trace edema to LLE edema Pulmonary: crackles to right base, otherwise clear, no increased work of breathing, no cough, room air Abdomen: normo-active BS + 4 quadrants, soft and non tender, no ascites Lewis: deferred MSK: no sarcopenia, moves all extremities, ambulatory Skin: warm and dry, no rashes or wounds on visible skin Neuro:  no generalized weakness, A & O x 2 Psych: non-anxious affect, pleasant Hem/lymph/immuno: no widespread bruising CURRENT PROBLEM LIST:  Patient Active Problem List   Diagnosis Date Noted   Dementia without behavioral disturbance ([place])    Goals of care, counseling/discussion    Palliative care encounter    Delirium 03/25/2019   Expressive dysphasia    Altered mental status    TIA (transient ischemic attack) 03/21/2019   AKI (acute kidney injury) ([place]) 09/05/2018   Diabetes mellitus without complication ([place]) 78/93/8101   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) 03/04/2015   Acute on chronic systolic CHF (congestive heart failure) ([facility]) 06/16/2014   Cardiomyopathy, ischemic 06/16/2014   CAD, multiple vessel 06/15/2014   Anemia 05/05/2014   Hypothyroidism 05/05/2014   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/05/2014   NSTEMI (non-ST elevated myocardial infarction) ([facility]) 05/02/2014   Hypertension 05/02/2014   Pulmonary edema cardiac cause ([facility]) 05/02/2014   PAST MEDICAL HISTORY:  Active Ambulatory Problems    Diagnosis Date Noted   NSTEMI (non-ST elevated myocardial infarction) ([facility]) 05/02/2014   Hypertension 05/02/2014   Pulmonary edema cardiac cause ([place]) 05/02/2014   Anemia 05/05/2014   Hypothyroidism 05/05/2014   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/05/2014   CAD, multiple vessel 06/15/2014   Acute on chronic systolic CHF (congestive heart failure) ([facility] [place]) 06/16/2014   Cardiomyopathy, ischemic 27/78/2423   Chronic systolic CHF (congestive heart failure) ([facility]) 03/04/2015   Nausea & vomiting 09/08/2017   Uncontrolled hypertension 09/09/2017   Diabetes mellitus without complication ([facility]) 53/61/4431   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   Aortic atherosclerosis ([place]) 10/22/2017   Carotid disease, bilateral ([facility]) 10/22/2017   AKI (acute kidney injury) ([facility]) 09/05/2018   TIA (transient ischemic attack) 03/21/2019   Altered mental status    Delirium 03/25/2019   Expressive dysphasia    Dementia without behavioral disturbance (  [place])    Goals of care, counseling/discussion    Palliative care encounter    Resolved Ambulatory Problems    Diagnosis Date Noted   No Resolved Ambulatory Problems   Past Medical History:  Diagnosis Date   CAD (coronary artery disease) 8/15   Thyroid disease    SOCIAL HX:  Social History   Tobacco Use   Smoking status: Former    Types: Cigarettes   Smokeless tobacco: Never  Substance  Use Topics   Alcohol use: No   FAMILY HX: No family history on file.    ALLERGIES:  Allergies  Allergen Reactions   Clonidine Derivatives Other (See Comments)    MADE THE PATIENT PASS OUT   Cefuroxime Axetil Nausea Only and Other (See Comments)    Cramps, also   Septra [Sulfamethoxazole-Trimethoprim] Nausea And Vomiting   Whiskey [Alcohol] Hives   Epinephrine Palpitations and Other (See Comments)    Heart races   Latex Rash and Other (See Comments)    Causes sores on the skin   Penicillins Palpitations    Heart races Did it involve swelling of the face/tongue/throat, SOB, or low BP? Unk Did it involve sudden or severe rash/hives, skin peeling, or any reaction on the inside of your mouth or nose? Unk Did you need to seek medical attention at a hospital or doctor's office? Yes When did it last happen? "During [employer]" (1940's) If all above answers are "NO", may proceed with cephalosporin use.    Tape Rash and Other (See Comments)    Latex tape causes sores on the skin     PERTINENT MEDICATIONS:  Outpatient Encounter Medications as of 08/24/2021  Medication Sig   amLODipine (NORVASC) 10 MG tablet Take 1 tablet (10 mg total) by mouth daily.   cephALEXin (KEFLEX) 500 MG capsule Take 1 capsule (500 mg total) by mouth 3 (three) times daily.   Digestive Enzymes (ENZYME DIGEST) CAPS Take by mouth.   escitalopram (LEXAPRO) 5 MG tablet Take by mouth.   Flaxseed, Linseed, (BIO-FLAX) 1000 MG CAPS Take 1,000 mg by mouth daily at 10 pm.   hydrALAZINE (APRESOLINE) 50 MG tablet Take by mouth.   levETIRAcetam (KEPPRA) 500 MG tablet Take 1 tablet (500 mg total) by mouth 2 (two) times daily.   levothyroxine (SYNTHROID) 137 MCG tablet Take 137 mcg by mouth every morning.   loratadine (CLARITIN) 10 MG tablet Take 10 mg by mouth daily at 6 PM.   magnesium oxide (MAG-OX) 400 MG tablet Take 400 mg by mouth 2 (two) times daily.    Melatonin 5 MG TABS Take 5 mg by mouth at bedtime.    metFORMIN  (GLUCOPHAGE) 500 MG tablet Take 500 mg by mouth 2 (two) times daily.   metoprolol succinate (TOPROL-XL) 25 MG 24 hr tablet TAKE ONE TABLET BY MOUTH EVERY DAY   nitroGLYCERIN (NITROSTAT) 0.4 MG SL tablet Place 1 tablet (0.4 mg total) under the tongue every 5 (five) minutes as needed for chest pain.   ondansetron (ZOFRAN ODT) 4 MG disintegrating tablet Take 1 tablet (4 mg total) by mouth every 8 (eight) hours as needed for nausea or vomiting.   polyethylene glycol (MIRALAX / GLYCOLAX) 17 g packet Take 17 g by mouth daily as needed for mild constipation or moderate constipation.   QUEtiapine (SEROQUEL) 25 MG tablet Take 25 mg by mouth at bedtime.    No facility-administered encounter medications on file as of 08/24/2021.   Thank you for the opportunity to participate in the care  of Ms. Wigen.  The palliative care team will continue to follow. Please call our office at [phone] if we can be of additional assistance.   Ezekiel Slocumb, NP   COVID-19 PATIENT SCREENING TOOL Asked and negative response unless otherwise noted:  Have you had symptoms of covid, tested positive or been in contact with someone with symptoms/positive test in the past 5-10 days? No

## 2021-09-05 ENCOUNTER — Other Ambulatory Visit: Payer: Self-pay | Admitting: Adult Health

## 2021-11-06 ENCOUNTER — Telehealth: Payer: Self-pay

## 2021-11-06 NOTE — Telephone Encounter (Signed)
VM left for patient's daughter, Kallie Locks, to check in on patient and offer to schedule visit with Palliative care

## 2021-11-07 ENCOUNTER — Telehealth: Payer: Self-pay | Admitting: Student

## 2021-11-07 NOTE — Telephone Encounter (Signed)
Palliative NP returned call to patient's daughter, [name]. Scheduled f/u appointment on 11/15/21 at 1pm.

## 2021-11-15 ENCOUNTER — Other Ambulatory Visit: Payer: Self-pay

## 2021-11-15 ENCOUNTER — Other Ambulatory Visit: Payer: Medicare Other | Admitting: Student

## 2021-11-22 ENCOUNTER — Other Ambulatory Visit: Payer: Medicare Other | Admitting: Student

## 2021-11-22 ENCOUNTER — Other Ambulatory Visit: Payer: Self-pay

## 2021-11-22 DIAGNOSIS — R52 Pain, unspecified: Secondary | ICD-10-CM

## 2021-11-22 DIAGNOSIS — Z515 Encounter for palliative care: Secondary | ICD-10-CM

## 2021-11-22 DIAGNOSIS — I5022 Chronic systolic (congestive) heart failure: Secondary | ICD-10-CM

## 2021-11-22 DIAGNOSIS — R531 Weakness: Secondary | ICD-10-CM

## 2021-11-22 NOTE — Progress Notes (Signed)
? ? ?Manufacturing engineer ?Community Palliative Care Consult Note ?Telephone: [phone]  ?Fax: [phone]  ? ? ?Date of encounter: 11/22/21 ?11:21 AM ?PATIENT NAME: Melinda Lewis ?[place] [address]   ?[phone] (home)  ?DOB: 05/25/1918 ?MRN: [id number] ?PRIMARY CARE PROVIDER:    ?Grayland Ormond Hestle, PA-C,  ?[address] ?[place] [address] ?[phone] ? ?REFERRING PROVIDER:   ?Donnamarie Rossetti, PA-C ?[address] ?[place],  [place] [address] ?[phone] ? ?RESPONSIBLE PARTY:    ?Contact Information   ? ? Name Relation Home Work Mobile  ? Melinda Lewis Daughter   [phone]  ? Remus Blake Sister   [phone]  ? ?  ? ? ? ?I met face to face with patient and family in the home. Palliative Care was asked to follow this patient by consultation request of  Venetia Maxon, Rolanda Jay,* to address advance care planning and complex medical decision making. This is a follow up visit. ? ?                                 ASSESSMENT AND PLAN / RECOMMENDATIONS:  ? ?Advance Care Planning/Goals of Care: Goals include to maximize quality of life and symptom management. Patient/health care surrogate gave his/her permission to discuss. ?Our advance care planning conversation included a discussion about:    ?The value and importance of advance care planning  ?Experiences with loved ones who have been seriously ill or have died  ?Exploration of personal, cultural or spiritual beliefs that might influence medical decisions  ?Exploration of goals of care in the event of a sudden injury or illness  ?CODE STATUS: DNR ? ?Education provided on Palliative Medicine vs hospice services. Family would like for patient to remain in the home. Will monitor for changes and declines. Will refer to hospice when she meets criteria. Daughter wants to avoid any blood draws. Will go to hospital if needed, unable to manage in the home.  ? ?Symptom Management/Plan: ? ?Chronic  CHF-patient has been stable; appears euvolemic today. Education provided on worsening symptoms such as shortness of breath, edema, fatigue, weakness.  ? ?Generalized weakness-patient encouraged to continue using walker for ambulation. Monitor for falls/safety. Daughter denies any needs for additional support at this time.  ? ?Pain-patient with bilateral shoulder OA, back pain-continue acetaminophen routinely as directed.  ? ?Follow up Palliative Care Visit: Palliative care will continue to follow for complex medical decision making, advance care planning, and clarification of goals. Return in 10-12 weeks or prn. ? ?This visit was coded based on medical decision making (MDM). ? ?PPS: 50% ? ?HOSPICE ELIGIBILITY/DIAGNOSIS: TBD ? ?Chief Complaint: Palliative Medicine follow up visit.  ? ?HISTORY OF PRESENT ILLNESS:  Melinda Lewis is a 88 y.o. year old female  with hypertension, chronic CHF, CAD, hyperlipidemia, dementia, hypothyroidism, chronic pain syndrome, DDD, OA of right shoulder, diabetes, PVD, anemia, TIA, CDK 3b, NSTEMI.   ? ?Daughter reports doing well. She is walking short distances with walker. Out of bed daily to chair. No falls reported. Occasional back pain and bilateral shoulder pain. Endorses a good appetite. Sleeping well. A 10-point ROS is negative, except for the pertinent positives and negatives detailed per the HPI. ? ? ?History obtained from review of EMR, discussion with primary team, and interview with family, facility staff/caregiver and/or Ms. Agent.  ?I reviewed available labs, medications, imaging, studies and related documents from the EMR.  Records reviewed  and summarized above.  ? ? ?Physical Exam: ?Pulse 72, resp 16, b/p 160/68, sats 94-95% on room air.  ?Constitutional: NAD ?General: frail appearing ?EYES: anicteric sclera, lids intact, no discharge  ?ENMT: slight hard of hearing, oral mucous membranes moist, dentition intact ?CV: S1S2, RRR, no LE edema ?Pulmonary: LCTA, no  increased work of breathing, no cough, room air ?Abdomen: normo-active BS + 4 quadrants, soft and non tender, no ascites ?GU: deferred ?MSK: no sarcopenia, moves all extremities, ambulatory ?Skin: warm and dry, no rashes or wounds on visible skin ?Neuro: +generalized weakness, A & O X 2 ?Psych: non-anxious affect ?Hem/lymph/immuno: no widespread bruising ? ? ?Thank you for the opportunity to participate in the care of Ms. Griffith.  The palliative care team will continue to follow. Please call our office at [phone] if we can be of additional assistance.  ? ?Ezekiel Slocumb, NP  ? ?COVID-19 PATIENT SCREENING TOOL ?Asked and negative response unless otherwise noted:  ? ?Have you had symptoms of covid, tested positive or been in contact with someone with symptoms/positive test in the past 5-10 days? No ? ?

## 2021-12-13 ENCOUNTER — Encounter: Payer: Self-pay | Admitting: Radiology

## 2021-12-13 ENCOUNTER — Emergency Department: Payer: Medicare Other

## 2021-12-13 ENCOUNTER — Other Ambulatory Visit: Payer: Self-pay

## 2021-12-13 ENCOUNTER — Emergency Department
Admission: EM | Admit: 2021-12-13 | Discharge: 2021-12-13 | Disposition: A | Discharge status: Home / Self Care | Payer: Medicare Other | Attending: Emergency Medicine | Admitting: Emergency Medicine

## 2021-12-13 DIAGNOSIS — R443 Hallucinations, unspecified: Secondary | ICD-10-CM | POA: Diagnosis not present

## 2021-12-13 DIAGNOSIS — F0390 Unspecified dementia without behavioral disturbance: Secondary | ICD-10-CM | POA: Diagnosis not present

## 2021-12-13 DIAGNOSIS — I110 Hypertensive heart disease with heart failure: Secondary | ICD-10-CM | POA: Diagnosis not present

## 2021-12-13 DIAGNOSIS — E039 Hypothyroidism, unspecified: Secondary | ICD-10-CM | POA: Insufficient documentation

## 2021-12-13 DIAGNOSIS — Z20822 Contact with and (suspected) exposure to covid-19: Secondary | ICD-10-CM | POA: Diagnosis not present

## 2021-12-13 DIAGNOSIS — I5023 Acute on chronic systolic (congestive) heart failure: Secondary | ICD-10-CM | POA: Diagnosis not present

## 2021-12-13 DIAGNOSIS — R4182 Altered mental status, unspecified: Secondary | ICD-10-CM | POA: Insufficient documentation

## 2021-12-13 DIAGNOSIS — I2510 Atherosclerotic heart disease of native coronary artery without angina pectoris: Secondary | ICD-10-CM | POA: Diagnosis not present

## 2021-12-13 DIAGNOSIS — N3000 Acute cystitis without hematuria: Secondary | ICD-10-CM | POA: Insufficient documentation

## 2021-12-13 DIAGNOSIS — E119 Type 2 diabetes mellitus without complications: Secondary | ICD-10-CM | POA: Diagnosis not present

## 2021-12-13 LAB — COMPREHENSIVE METABOLIC PANEL
ALT: 18 U/L (ref 0–44)
AST: 35 U/L (ref 15–41)
Albumin: 3.6 g/dL (ref 3.5–5.0)
Alkaline Phosphatase: 43 U/L (ref 38–126)
Anion gap: 9 (ref 5–15)
BUN: 25 mg/dL — ABNORMAL HIGH (ref 8–23)
CO2: 26 mmol/L (ref 22–32)
Calcium: 9.2 mg/dL (ref 8.9–10.3)
Chloride: 98 mmol/L (ref 98–111)
Creatinine, Ser: 1.1 mg/dL — ABNORMAL HIGH (ref 0.44–1.00)
GFR, Estimated: 45 mL/min — ABNORMAL LOW (ref >60)
Glucose, Bld: 167 mg/dL — ABNORMAL HIGH (ref 70–99)
Potassium: 5.7 mmol/L — ABNORMAL HIGH (ref 3.5–5.1)
Sodium: 133 mmol/L — ABNORMAL LOW (ref 135–145)
Total Bilirubin: 1.1 mg/dL (ref 0.3–1.2)
Total Protein: 7.6 g/dL (ref 6.5–8.1)

## 2021-12-13 LAB — URINALYSIS, COMPLETE (UACMP) WITH MICROSCOPIC
Bacteria, UA: NONE SEEN
Bilirubin Urine: NEGATIVE
Glucose, UA: 50 mg/dL — AB
Ketones, ur: NEGATIVE mg/dL
Nitrite: NEGATIVE
Protein, ur: 100 mg/dL — AB
Specific Gravity, Urine: 1.012 (ref 1.005–1.030)
pH: 6 (ref 5.0–8.0)

## 2021-12-13 LAB — CBC WITH DIFFERENTIAL/PLATELET
Abs Immature Granulocytes: 0.06 10*3/uL (ref 0.00–0.07)
Basophils Absolute: 0.1 10*3/uL (ref 0.0–0.1)
Basophils Relative: 1 %
Eosinophils Absolute: 0.1 10*3/uL (ref 0.0–0.5)
Eosinophils Relative: 0 %
HCT: 38.7 % (ref 36.0–46.0)
Hemoglobin: 12.3 g/dL (ref 12.0–15.0)
Immature Granulocytes: 1 %
Lymphocytes Relative: 33 %
Lymphs Abs: 3.9 10*3/uL (ref 0.7–4.0)
MCH: 30.5 pg (ref 26.0–34.0)
MCHC: 31.8 g/dL (ref 30.0–36.0)
MCV: 96 fL (ref 80.0–100.0)
Monocytes Absolute: 0.8 10*3/uL (ref 0.1–1.0)
Monocytes Relative: 6 %
Neutro Abs: 7.1 10*3/uL (ref 1.7–7.7)
Neutrophils Relative %: 59 %
Platelets: 310 10*3/uL (ref 150–400)
RBC: 4.03 MIL/uL (ref 3.87–5.11)
RDW: 14.2 % (ref 11.5–15.5)
WBC: 11.9 10*3/uL — ABNORMAL HIGH (ref 4.0–10.5)
nRBC: 0 % (ref 0.0–0.2)

## 2021-12-13 LAB — BASIC METABOLIC PANEL
Anion gap: 7 (ref 5–15)
BUN: 25 mg/dL — ABNORMAL HIGH (ref 8–23)
CO2: 28 mmol/L (ref 22–32)
Calcium: 9.1 mg/dL (ref 8.9–10.3)
Chloride: 99 mmol/L (ref 98–111)
Creatinine, Ser: 1.08 mg/dL — ABNORMAL HIGH (ref 0.44–1.00)
GFR, Estimated: 46 mL/min — ABNORMAL LOW (ref >60)
Glucose, Bld: 171 mg/dL — ABNORMAL HIGH (ref 70–99)
Potassium: 5.1 mmol/L (ref 3.5–5.1)
Sodium: 134 mmol/L — ABNORMAL LOW (ref 135–145)

## 2021-12-13 LAB — RESP PANEL BY RT-PCR (FLU A&B, COVID) ARPGX2
Influenza A by PCR: NEGATIVE
Influenza B by PCR: NEGATIVE
SARS Coronavirus 2 by RT PCR: NEGATIVE

## 2021-12-13 LAB — TROPONIN I (HIGH SENSITIVITY)
Troponin I (High Sensitivity): 24 ng/L — ABNORMAL HIGH (ref <18)
Troponin I (High Sensitivity): 26 ng/L — ABNORMAL HIGH (ref <18)

## 2021-12-13 MED ORDER — CEPHALEXIN 500 MG PO CAPS
500.0000 mg | ORAL_CAPSULE | Freq: Once | ORAL | Status: AC
  Administered 2021-12-13: 500 mg via ORAL
  Filled 2021-12-13: qty 1

## 2021-12-13 MED ORDER — CEPHALEXIN 500 MG PO CAPS: 500.0000 mg | ORAL_CAPSULE | Freq: Two times a day (BID) | ORAL | 0 refills | Status: AC

## 2021-12-13 NOTE — ED Provider Notes (Signed)
? ?[facility] ?Provider Note ? ? ? Event Date/Time  ? First MD Initiated Contact with Patient 12/13/21 [phone]   ?  (approximate) ? ? ?History  ? ?Altered Mental Status ? ? ?HPI ? ?Melinda Lewis is a 88 y.o. female  with pmh DM, HTN, TIA, CAD, CHF who presents with AMS.  She is accompanied by her daughters, 1 of whom lives with the patient.  Apparently over the weekend she started having hallucinations.  Maybe has had some mild confusion although she cannot pinpoint exactly what is different.  Says that she has been seeing children stealing her close.  Patient otherwise denies any complaints currently denies shortness of breath chest pain nausea vomiting abdominal pain fevers.  Has been eating and drinking well. ? ?  ? ?Past Medical History:  ?Diagnosis Date  ? Acute on chronic systolic CHF (congestive heart failure) ([facility]) 06/16/2014  ? Anemia   ? CAD (coronary artery disease) 8/15  ? s/p DES LCX and LAD   ? Cardiomyopathy, ischemic 06/16/2014  ? Diabetes mellitus without complication ([place])   ? Hypertension   ? NSTEMI (non-ST elevated myocardial infarction) ([place])   ? Thyroid disease   ? TIA (transient ischemic attack) 2010  ? ? ?Patient Active Problem List  ? Diagnosis Date Noted  ? Dementia without behavioral disturbance ([place])   ? Goals of care, counseling/discussion   ? Palliative care encounter   ? Delirium 03/25/2019  ? Expressive dysphasia   ? Altered mental status   ? TIA (transient ischemic attack) 03/21/2019  ? AKI (acute kidney injury) ([place]) 09/05/2018  ? Diabetes mellitus without complication ([place]) [id number]  ? 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  ? Aortic atherosclerosis ([place]) 10/22/2017  ? Carotid disease, bilateral ([place]) 10/22/2017  ? Uncontrolled hypertension 09/09/2017  ? Nausea & vomiting 09/08/2017  ? Chronic systolic CHF (congestive heart failure) ([place]) 03/04/2015  ? Acute on chronic systolic CHF (congestive heart failure) ([place]) 06/16/2014  ? Cardiomyopathy, ischemic 06/16/2014  ? CAD,  multiple vessel 06/15/2014  ? Anemia 05/05/2014  ? Hypothyroidism 05/05/2014  ? 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/05/2014  ? NSTEMI (non-ST elevated myocardial infarction) ([place]) 05/02/2014  ? Hypertension 05/02/2014  ? Pulmonary edema cardiac cause ([facility]) 05/02/2014  ? ? ? ?Physical Exam  ?Triage Vital Signs: ?ED Triage Vitals  ?Enc Vitals Group  ?   BP --   ?   Pulse Rate 12/13/21 0947 90  ?   Resp 12/13/21 0947 17  ?   Temp --   ?   Temp src --   ?   SpO2 12/13/21 0944 94 %  ?   Weight 12/13/21 0948 175 lb (79.4 kg)  ?   Height 12/13/21 0948 5\' 2"  (1.575 m)  ?   Head Circumference --   ?   Peak Flow --   ?   Pain Score --   ?   Pain Loc --   ?   Pain Edu? --   ?   Excl. in [place]? --   ? ? ?Most recent vital signs: ?Vitals:  ? 12/13/21 1030 12/13/21 1100  ?BP: (!) 130/49 (!) 149/46  ?Pulse: 81 79  ?Resp: 14 14  ?Temp:    ?SpO2: 98% 93%  ? ? ? ?General: Awake, no distress.  ?CV:  Good peripheral perfusion.  ?Resp:  Normal effort.  ?Abd:  No distention.  ?Neuro:             Awake, Alert, Oriented x 3  ?Other:  Aox3, nml speech  ?PERRL, EOMI, face symmetric, nml tongue movement  ?5/5 strength in the BL upper and lower extremities  ?Sensation grossly intact in the BL upper and lower extremities  ?Finger-nose-finger intact BL ? ? ? ?ED Results / Procedures / Treatments  ?Labs ?(all labs ordered are listed, but only abnormal results are displayed) ?Labs Reviewed  ?COMPREHENSIVE METABOLIC PANEL - Abnormal; Notable for the following components:  ?    Result Value  ? Sodium 133 (*)   ? Potassium 5.7 (*)   ? Glucose, Bld 167 (*)   ? BUN 25 (*)   ? Creatinine, Ser 1.10 (*)   ? GFR, Estimated 45 (*)   ? All other components within normal limits  ?CBC WITH DIFFERENTIAL/PLATELET - Abnormal; Notable for the following components:  ? WBC 11.9 (*)   ? All other components within normal limits  ?URINALYSIS, COMPLETE (UACMP) WITH MICROSCOPIC - Abnormal; Notable for the following components:  ? Color, Urine YELLOW (*)   ? APPearance  CLEAR (*)   ? Glucose, UA 50 (*)   ? Hgb urine dipstick SMALL (*)   ? Protein, ur 100 (*)   ? Leukocytes,Ua MODERATE (*)   ? All other components within normal limits  ?BASIC METABOLIC PANEL - Abnormal; Notable for the following components:  ? Sodium 134 (*)   ? Glucose, Bld 171 (*)   ? BUN 25 (*)   ? Creatinine, Ser 1.08 (*)   ? GFR, Estimated 46 (*)   ? All other components within normal limits  ?TROPONIN I (HIGH SENSITIVITY) - Abnormal; Notable for the following components:  ? Troponin I (High Sensitivity) 24 (*)   ? All other components within normal limits  ?TROPONIN I (HIGH SENSITIVITY) - Abnormal; Notable for the following components:  ? Troponin I (High Sensitivity) 26 (*)   ? All other components within normal limits  ?RESP PANEL BY RT-PCR (FLU A&B, COVID) ARPGX2  ? ? ? ?EKG ? ?EKG interpretation performed by myself: NSR, nml axis, nml intervals, no acute ischemic changes ? ? ? ?RADIOLOGY ?I reviewed the CT scan of the brain which does not show any acute intracranial process; agree with radiology report  ? ?Reviewed the patient's x-ray, equivocal finding of possible skinfold versus apical pneumothorax on single view AP film ? ? ?PROCEDURES: ? ?Critical Care performed: No ? ?Procedures ? ?The patient is on the cardiac monitor to evaluate for evidence of arrhythmia and/or significant heart rate changes. ? ? ?MEDICATIONS ORDERED IN ED: ?Medications  ?cephALEXin (KEFLEX) capsule 500 mg (has no administration in time range)  ? ? ? ?IMPRESSION / MDM / ASSESSMENT AND PLAN / ED COURSE  ?I reviewed the triage vital signs and the nursing notes. ?             ?               ? ?Differential diagnosis includes, but is not limited to, dementia, UTI, electrolyte abnormality, AKI, less likely ACS, VA ? ?Patient is 88 year old female who is remarkably with it and functional, lives with her daughter who is presenting due to hallucinations.  Has been seeing children stealing her belongings.  Her daughter notes she may also  be a little bit confused however on my evaluation she is alert and oriented x3 she is appropriate and she has an intact neurologic exam.  Not hallucinating actively.  CT head is negative for acute findings.  Vital signs are overall reassuring.  Will screen for electrode  abnormality UTI pneumonia which could be causing an acute change in mental status however overall I think she is doing remarkably well for her age and like to avoid hospitalization if necessary.  This could just be progression of dementia. ? ?UA with 10-20 WBCs, was a cath specimen and with no other explanation for potential change in mental status will treat with Keflex as simple cystitis. Cr at baseline. Initial K 5.7 hemolyzed, but on repeat 5.1.  She has a mild like a cytosis of 12 not showing any signs of sepsis.  Troponin initially is 24 and on repeat is 26 patient not having chest pain and EKG is nonischemic this is likely in the setting of her chronic CHF.  Patient continues to be appropriate in the ED not showing any signs of confusion.  I think she is appropriate for discharge.  Discussed the plan with her daughter who knows to follow-up with the PCP if hallucinations are not improving and to return to the ED if symptoms are worsening. ? ?  ? ? ?FINAL CLINICAL IMPRESSION(S) / ED DIAGNOSES  ? ?Final diagnoses:  ?Hallucinations  ?Acute cystitis without hematuria  ? ? ? ?Rx / DC Orders  ? ?ED Discharge Orders   ? ?      Ordered  ?  cephALEXin (KEFLEX) 500 MG capsule  2 times daily       ? 12/13/21 1305  ? ?  ?  ? ?  ? ? ? ?Note:  This document was prepared using Dragon voice recognition software and may include unintentional dictation errors. ?  ?Rada Hay, MD ?12/13/21 1311 ? ?

## 2021-12-13 NOTE — Discharge Instructions (Addendum)
We are treating you for urinary tract infection.  Please take the antibiotic twice a day for the next 5 days.  If your confusion is worsening or you develop any new symptoms that are concerning to you, please return to the emergency department.  If the hallucinations are not improving, please follow-up with your primary care provider. ?

## 2021-12-13 NOTE — ED Triage Notes (Signed)
Family reports over the past few days pt has had increasing confusion. Normally alert and oriented pt is alert and oriented x4 but states she does feel she has been a bit confused. Family states hallucinations. ?

## 2022-02-05 ENCOUNTER — Other Ambulatory Visit: Payer: Medicare Other

## 2022-02-05 DIAGNOSIS — Z515 Encounter for palliative care: Secondary | ICD-10-CM

## 2022-02-05 NOTE — Progress Notes (Signed)
COMMUNITY PALLIATIVE CARE SW NOTE  PATIENT NAME: Melinda Lewis DOB: 1918-03-04 MRN: [id number]  PRIMARY CARE PROVIDER: Donnamarie Rossetti, PA-C  RESPONSIBLE PARTY:  Acct ID - Guarantor Home Phone Work Phone Relationship Acct Type  [id number] Gevena [phone]  Self P/F     [address], [place], [place] [address]    Palliative care SW outreached patient to complete telephonic visit.    Palliative care SW outreached patient to complete telephonic visit. Patient's daughter, [name], provided update on medical condition and/or changes.    Daughter endorses patient is stable, However complains of pain and is weak. No falls reported. Continues to be SUP-MIN A with ADL's. Patient is ambulatory with RW very short distances due to pain.    Hospitalizations: had an ED visit on 12/13/21; complaint of hallucinations. Per daughter she was told that this may haven been caused by medication interactions. Declines hallucinations since and is back at baseline.  Appointments: recently saw PCP 01/15/22.   Appetite: Patients appetite remains good.  No significant weight changes noted.    Pain medications adjustments by PCP: stop metformin, losartan and do not take potassium. Blood sugars have been running high, checking 3x/wk average is running mid to high 150's. Continue to check until 02/15/22.  Patient is compliant with medications.     Psychosocial assessment: No needs identified. No S/S of depression or anxiety noted. Daughter expressed need for an in home provider as it is becoming more difficult for patient to leave the home.   PC Visit scheduled for 02/20/22 @1PM  with PC NP L. Rivers.     Palliative care will continue to monitor and assist with long term care planning as needed.  SOCIAL HX:  Social History   Tobacco Use   Smoking status: Former    Types: Cigarettes   Smokeless tobacco: Never  Substance Use Topics   Alcohol use: No    CODE STATUS: DNR  ADVANCED  DIRECTIVES: Y   Time spent: 20 min      [place], [place]

## 2022-02-20 ENCOUNTER — Other Ambulatory Visit: Payer: Medicare Other | Admitting: Student

## 2022-02-20 DIAGNOSIS — I5022 Chronic systolic (congestive) heart failure: Secondary | ICD-10-CM

## 2022-02-20 DIAGNOSIS — R531 Weakness: Secondary | ICD-10-CM

## 2022-02-20 DIAGNOSIS — Z515 Encounter for palliative care: Secondary | ICD-10-CM

## 2022-02-20 NOTE — Progress Notes (Unsigned)
[facility] Consult Note Telephone: [phone]  Fax: [phone]    Date of encounter: 02/20/22 1:18 PM PATIENT NAME: Melinda Lewis [address] [place] [address]   [phone] (home)  DOB: [birth year] MRN: [id number] PRIMARY CARE PROVIDER:    Donnamarie Rossetti, PA-C,  [place] [address] [phone]  REFERRING PROVIDER:   Donnamarie Rossetti, PA-C [address] [place],   [address] [phone]  RESPONSIBLE PARTY:    Contact Information     Name Relation Home Work [place] Daughter   [phone]   Lorrin Goodell Daughter [phone]  [phone]        I met face to face with patient and family in the home. Palliative Care was asked to follow this patient by consultation request of  Venetia Maxon, Rolanda Jay,* to address advance care planning and complex medical decision making. This is a follow up visit.                                   ASSESSMENT AND PLAN / RECOMMENDATIONS:   Advance Care Planning/Goals of Care: Goals include to maximize quality of life and symptom management. Patient/health care surrogate gave his/her permission to discuss. CODE STATUS: DNR  Symptom Management/Plan:  Chronic CHF-patient has been stable, no recent exacerbations. Appears euvolemic today. Education provided on worsening symptoms such as shortness of breath, edema, fatigue, weakness.    Generalized weakness-patient encouraged to continue using walker for ambulation. Monitor for falls/safety. HH had been offered recently; daughter declined. She would eventually like assistance with showering patient, but not at this time.   Follow up Palliative Care Visit: Palliative care will continue to follow for complex medical decision making, advance care planning, and clarification of goals. Return in 8 weeks or prn.  This visit was coded based on medical decision making  (MDM).  PPS: 50%  HOSPICE ELIGIBILITY/DIAGNOSIS: TBD  Chief Complaint: Palliative Medicine follow up visit.   HISTORY OF PRESENT ILLNESS:  Melinda Lewis is a 88 y.o. year old female  with chronic CHF, hypertension, CAD, hyperlipidemia, dementia, hypothyroidism, chronic pain syndrome, DDD, OA of right shoulder, diabetes, PVD, anemia, TIA, CDK 3b, NSTEMI. ED visit on 12/13/21 due to AMS, hallucinations.   Patient and daughter report patient doing well, stable. She does require some assistance with adl's; ambulates short distances with walker. Family assist with showers. No falls reported. Receives routine acetaminophen for chronic back and shoulder pain. Denies shortness of breath, edema. Good appetite endorsed. Daughter states they were instructed to check patient's blood sugar for a week; BS were around 150 mg/dL. She is no longer taking metformin. No hypoglycemic episodes reported. No sleep difficulty. A 10-point ROS is negative, except for the pertinent positives and negatives detailed per the HPI.  History obtained from review of EMR, discussion with primary team, and interview with family, facility staff/caregiver and/or Ms. Demarais.  I reviewed available labs, medications, imaging, studies and related documents from the EMR.  Records reviewed and summarized above.   Physical Exam:  Pulse 60, resp 16, b/p 138/50, sats 92% on room air Constitutional: NAD General: frail appearing EYES: anicteric sclera, lids intact, no discharge  ENMT: slight hard of hearing, oral mucous membranes moist, dentition intact CV: S1S2, RRR, no LE edema Pulmonary: LCTA, no increased work of breathing, no cough, room air Abdomen: normo-active BS + 4 quadrants, soft and  non tender, no ascites GU: deferred MSK: moves all extremities, ambulatory with walker Skin: warm and dry, no rashes or wounds on visible skin Neuro:  no generalized weakness Psych: non-anxious affect, A and O x 2, pleasant Hem/lymph/immuno:  no widespread bruising   Thank you for the opportunity to participate in the care of [name].  The palliative care team will continue to follow. Please call our office at [phone] if we can be of additional assistance.   Ezekiel Slocumb, NP   COVID-19 PATIENT SCREENING TOOL Asked and negative response unless otherwise noted:   Have you had symptoms of covid, tested positive or been in contact with someone with symptoms/positive test in the past 5-10 days? No

## 2022-03-14 ENCOUNTER — Other Ambulatory Visit: Payer: Self-pay | Admitting: Cardiology

## 2022-04-24 ENCOUNTER — Other Ambulatory Visit: Payer: Medicare Other | Admitting: Student

## 2022-04-24 DIAGNOSIS — R5381 Other malaise: Secondary | ICD-10-CM

## 2022-04-24 DIAGNOSIS — I5022 Chronic systolic (congestive) heart failure: Secondary | ICD-10-CM

## 2022-04-24 DIAGNOSIS — R531 Weakness: Secondary | ICD-10-CM

## 2022-04-24 DIAGNOSIS — Z515 Encounter for palliative care: Secondary | ICD-10-CM

## 2022-04-24 NOTE — Progress Notes (Signed)
[facility] Consult Note Telephone: [phone]  Fax: [phone]    Date of encounter: 04/24/22 1:09 PM PATIENT NAME: Melinda Lewis [address] [place] [address]   [phone] (home)  DOB: [birth year] MRN: [id number] PRIMARY CARE PROVIDER:    Donnamarie Rossetti, PA-C,  [place] [address] [phone]  REFERRING PROVIDER:   Donnamarie Rossetti, PA-C [address] [place],  [place] [address] [phone]  RESPONSIBLE PARTY:    Contact Information     Name Relation Home Work [place] Daughter   [phone]   Lorrin Goodell Daughter [phone]  [phone]        I met face to face with patient and family in the home. Palliative Care was asked to follow this patient by consultation request of  Venetia Maxon, Rolanda Jay,* to address advance care planning and complex medical decision making. This is a follow up visit.                                   ASSESSMENT AND PLAN / RECOMMENDATIONS:   Advance Care Planning/Goals of Care: Goals include to maximize quality of life and symptom management. Patient/health care surrogate gave his/her permission to discuss. Our advance care planning conversation included a discussion about:    The value and importance of advance care planning  Experiences with loved ones who have been seriously ill or have died  Exploration of personal, cultural or spiritual beliefs that might influence medical decisions  Exploration of goals of care in the event of a sudden injury or illness  CODE STATUS: DNR  Palliative Medicine will continue to provide supportive care. Patient is needing assistance with bathing; she declines need for home health. Information provided on who would cover this service. Family nor patient are ready for hospice. Daughter is encouraged to call insurance company to see if they would pay for [facility] given patient is home  bound. She does not have long term care policy, nor Medicaid. Daughter states they cannot pay for out of pocket. Daughter expresses patient will go to hospital as needed. No lab work per daughter.   Symptom Management/Plan:  Chronic Heart failure- patient appears euvolemic. No recent exacerbations. No worsening shortness of breath, no LE edema. Monitor for worsening symptoms such as worsening shortness of breath, fatigue, weakness, LE edema.    Debility, weakness- patient is requiring more assistance with bathing. Daughter having increased difficulty d/t back injury. Discussed options for outside support; daughter declines need for home health therapy. Patient still ambulatory from her room to chair, to bathroom. No recent falls or injury.   Follow up Palliative Care Visit: Palliative care will continue to follow for complex medical decision making, advance care planning, and clarification of goals. Return in 8 weeks or prn.   This visit was coded based on medical decision making (MDM).  PPS: 50%  HOSPICE ELIGIBILITY/DIAGNOSIS: TBD  Chief Complaint: Palliative Medicine follow up visit.  HISTORY OF PRESENT ILLNESS:  Melinda Lewis is a 88 y.o. year old female  with chronic CHF, hypertension, CAD, hyperlipidemia, dementia, hypothyroidism, chronic pain syndrome, DDD, OA of right shoulder, diabetes, PVD, anemia, TIA, CDK 3b, NSTEMI. ED visit on 12/13/21 due to AMS, hallucinations.   Patient's daughter had called about getting help with bathing; explained that hospice referral had been made. Daughter is adamant she does not want hospice service; just  assistance with bathing. Patient is out of bed daily to chair. Seh does endorse chronic back pain, but no worsening of pain, no falls. Denies shortness of breath, LE edema. Good appetite endorsed; no weight loss reported. Patient is sleeping from 10 pm til between 11a-1pm each afternoon. No worsening confusion. No recent infections. Patient went on car  ride recently and daughter states patient c/o back pain. Patient received resting in bed; she arouses to stimulation. She answers direct questions; she is hard of hearing. A 10-point ROS is negative, except for the pertinent positives and negatives detailed per the HPI  History obtained from review of EMR, discussion with primary team, and interview with family, facility staff/caregiver and/or Ms. Boomershine.  I reviewed available labs, medications, imaging, studies and related documents from the EMR.  Records reviewed and summarized above.   Physical Exam: Pulse 76, resp 20, b/p 140/60, sats 94% on room air Constitutional: NAD General: frail appearing EYES: anicteric sclera, lids intact, no discharge  ENMT: hard of  hearing, oral mucous membranes dry CV: S1S2, RRR, no LE edema Pulmonary: LCTA, no increased work of breathing, no cough, room air Abdomen: normo-active BS + 4 quadrants, soft and non tender, no ascites GU: deferred MSK: moves all extremities, ambulatory Skin: warm and dry, no rashes or wounds on visible skin Neuro:  + generalized weakness, A & O x 2 Psych: non-anxious affect, pleasant Hem/lymph/immuno: no widespread bruising   Thank you for the opportunity to participate in the care of Melinda Lewis.  The palliative care team will continue to follow. Please call our office at [phone] if we can be of additional assistance.   Ezekiel Slocumb, NP   COVID-19 PATIENT SCREENING TOOL Asked and negative response unless otherwise noted:   Have you had symptoms of covid, tested positive or been in contact with someone with symptoms/positive test in the past 5-10 days? No

## 2022-06-18 ENCOUNTER — Telehealth: Payer: Medicare Other | Admitting: Dermatology

## 2022-06-19 ENCOUNTER — Encounter: Payer: Self-pay | Admitting: Dermatology

## 2022-06-19 ENCOUNTER — Telehealth (INDEPENDENT_AMBULATORY_CARE_PROVIDER_SITE_OTHER): Payer: Medicare Other | Admitting: Dermatology

## 2022-06-19 DIAGNOSIS — L309 Dermatitis, unspecified: Secondary | ICD-10-CM | POA: Diagnosis not present

## 2022-06-19 MED ORDER — HYDROCORTISONE 2.5 % EX OINT: TOPICAL_OINTMENT | CUTANEOUS | 0 refills | Status: DC

## 2022-06-19 NOTE — Progress Notes (Signed)
   New Patient Visit  Subjective  Melinda Lewis is a 88 y.o. female who presents for the following: Skin Problem.  Virtual video visit today Patient located in [place], [place] Physician located in [place], [place] Patient consents to telehealth visit today including potential limitations and voices understanding their insurance will be billed just like for a regular in-office visit Virtual visit duration 11 minutes 33 seconds.  Patient has pink, puffy area of the left medial cheek/paranasal area that started last week. Area is not tender, itchy, or oozing. No warmth at this area. She has been using [employer] to treat.  Patient accompanied on video visit by granddaughter, Barnett Applebaum.   The following portions of the chart were reviewed this encounter and updated as appropriate:       Review of Systems:  No other skin or systemic complaints except as noted in HPI or Assessment and Plan.  Objective  Well appearing patient in no apparent distress; mood and affect are within normal limits.  A focused examination was performed including face. Relevant physical exam findings are noted in the Assessment and Plan.  Left Malar Cheek Mild erythema and edema of the left paranasal and infraocular cheek; per patient no ooze, crust, itch, or pain. Previously submitted photos of face reviewed prior to video visit    Assessment & Plan  Dermatitis Left Malar Cheek  Unclear etiology, possible contact. Stop [employer].  Start hydrocortisone 2.5% ointment Apply to affected area rash of the left cheek twice a day for up to 2 weeks dsp 30g 0Rf.  Observe for spreading. Granddaughter will call office/send MyChart message if area not improving or is worsening. Discussed sign/sx of infection.  hydrocortisone 2.5 % ointment - Left Malar Cheek Apply to affected area rash on left cheek twice a day for up to 2 weeks.   Return if symptoms worsen or fail to improve.  IJamesetta Orleans, CMA, am  acting as scribe for Brendolyn Patty, MD .  Documentation: I have reviewed the above documentation for accuracy and completeness, and I agree with the above.  Brendolyn Patty MD

## 2022-06-19 NOTE — Patient Instructions (Signed)
Due to recent changes in healthcare laws, you may see results of your pathology and/or laboratory studies on MyChart before the doctors have had a chance to review them. We understand that in some cases there may be results that are confusing or concerning to you. Please understand that not all results are received at the same time and often the doctors may need to interpret multiple results in order to provide you with the best plan of care or course of treatment. Therefore, we ask that you please give us 2 business days to thoroughly review all your results before contacting the office for clarification. Should we see a critical lab result, you will be contacted sooner.   If You Need Anything After Your Visit  If you have any questions or concerns for your doctor, please call our main line at [phone] and press option 4 to reach your doctor's medical assistant. If no one answers, please leave a voicemail as directed and we will return your call as soon as possible. Messages left after 4 pm will be answered the following business day.   You may also send us a message via MyChart. We typically respond to MyChart messages within 1-2 business days.  For prescription refills, please ask your pharmacy to contact our office. Our fax number is [phone].  If you have an urgent issue when the clinic is closed that cannot wait until the next business day, you can page your doctor at the number below.    Please note that while we do our best to be available for urgent issues outside of office hours, we are not available 24/7.   If you have an urgent issue and are unable to reach us, you may choose to seek medical care at your doctor's office, retail clinic, urgent care center, or emergency room.  If you have a medical emergency, please immediately call 911 or go to the emergency department.  Pager Numbers  - Dr. Kowalski: [phone]  - Dr. Moye: [phone]  - Dr. Stewart:  [phone]  In the event of inclement weather, please call our main line at [phone] for an update on the status of any delays or closures.  Dermatology Medication Tips: Please keep the boxes that topical medications come in in order to help keep track of the instructions about where and how to use these. Pharmacies typically print the medication instructions only on the boxes and not directly on the medication tubes.   If your medication is too expensive, please contact our office at [phone] option 4 or send us a message through MyChart.   We are unable to tell what your co-pay for medications will be in advance as this is different depending on your insurance coverage. However, we may be able to find a substitute medication at lower cost or fill out paperwork to get insurance to cover a needed medication.   If a prior authorization is required to get your medication covered by your insurance company, please allow us 1-2 business days to complete this process.  Drug prices often vary depending on where the prescription is filled and some pharmacies may offer cheaper prices.  The website www.goodrx.com contains coupons for medications through different pharmacies. The prices here do not account for what the cost may be with help from insurance (it may be cheaper with your insurance), but the website can give you the price if you did not use any insurance.  - You can print the associated coupon and take it with   your prescription to the pharmacy.  - You may also stop by our office during regular business hours and pick up a GoodRx coupon card.  - If you need your prescription sent electronically to a different pharmacy, notify our office through [facility] MyChart or by phone at [phone] option 4.     Si Usted Necesita Algo Despus de Su Visita  Tambin puede enviarnos un mensaje a travs de MyChart. Por lo general respondemos a los mensajes de MyChart en el transcurso de 1 a 2  das hbiles.  Para renovar recetas, por favor pida a su farmacia que se ponga en contacto con nuestra oficina. Nuestro nmero de fax es el [phone].  Si tiene un asunto urgente cuando la clnica est cerrada y que no puede esperar hasta el siguiente da hbil, puede llamar/localizar a su doctor(a) al nmero que aparece a continuacin.   Por favor, tenga en cuenta que aunque hacemos todo lo posible para estar disponibles para asuntos urgentes fuera del horario de oficina, no estamos disponibles las 24 horas del da, los 7 das de la semana.   Si tiene un problema urgente y no puede comunicarse con nosotros, puede optar por buscar atencin mdica  en el consultorio de su doctor(a), en una clnica privada, en un centro de atencin urgente o en una sala de emergencias.  Si tiene una emergencia mdica, por favor llame inmediatamente al 911 o vaya a la sala de emergencias.  Nmeros de bper  - Dr. Kowalski: [phone]  - Dra. Moye: [phone]  - Dra. Stewart: [phone]  En caso de inclemencias del tiempo, por favor llame a nuestra lnea principal al [phone] para una actualizacin sobre el estado de cualquier retraso o cierre.  Consejos para la medicacin en dermatologa: Por favor, guarde las cajas en las que vienen los medicamentos de uso tpico para ayudarle a seguir las instrucciones sobre dnde y cmo usarlos. Las farmacias generalmente imprimen las instrucciones del medicamento slo en las cajas y no directamente en los tubos del medicamento.   Si su medicamento es muy caro, por favor, pngase en contacto con nuestra oficina llamando al [phone] y presione la opcin 4 o envenos un mensaje a travs de MyChart.   No podemos decirle cul ser su copago por los medicamentos por adelantado ya que esto es diferente dependiendo de la cobertura de su seguro. Sin embargo, es posible que podamos encontrar un medicamento sustituto a menor costo o llenar un formulario para que el  seguro cubra el medicamento que se considera necesario.   Si se requiere una autorizacin previa para que su compaa de seguros cubra su medicamento, por favor permtanos de 1 a 2 das hbiles para completar este proceso.  Los precios de los medicamentos varan con frecuencia dependiendo del lugar de dnde se surte la receta y alguna farmacias pueden ofrecer precios ms baratos.  El sitio web www.goodrx.com tiene cupones para medicamentos de diferentes farmacias. Los precios aqu no tienen en cuenta lo que podra costar con la ayuda del seguro (puede ser ms barato con su seguro), pero el sitio web puede darle el precio si no utiliz ningn seguro.  - Puede imprimir el cupn correspondiente y llevarlo con su receta a la farmacia.  - Tambin puede pasar por nuestra oficina durante el horario de atencin regular y recoger una tarjeta de cupones de GoodRx.  - Si necesita que su receta se enve electrnicamente a una farmacia diferente, informe a nuestra oficina a travs de MyChart de [place]   o por telfono llamando al [phone] y presione la opcin 4.  

## 2022-07-10 ENCOUNTER — Other Ambulatory Visit: Payer: Medicare Other | Admitting: Student

## 2022-07-17 ENCOUNTER — Other Ambulatory Visit: Payer: Medicare Other | Admitting: Student

## 2022-07-17 DIAGNOSIS — Z515 Encounter for palliative care: Secondary | ICD-10-CM

## 2022-07-17 DIAGNOSIS — R5381 Other malaise: Secondary | ICD-10-CM

## 2022-07-17 DIAGNOSIS — R531 Weakness: Secondary | ICD-10-CM

## 2022-07-17 DIAGNOSIS — I5022 Chronic systolic (congestive) heart failure: Secondary | ICD-10-CM

## 2022-07-17 NOTE — Progress Notes (Signed)
[facility] Palliative Care Consult Note Telephone: [phone]  Fax: [phone]    Date of encounter: 07/17/22 1:39 PM PATIENT NAME: Melinda Lewis [address] [place] [address]   [phone] (home)  DOB: 1918-07-29 MRN: [id number] PRIMARY CARE PROVIDER:    Donnamarie Rossetti, PA-C,  [place] [address] [phone]  REFERRING PROVIDER:   Donnamarie Rossetti, PA-C [address] [place],  [place] [address] [phone]  RESPONSIBLE PARTY:    Contact Information     Name Relation Home Work [place] Daughter   [phone]   Lorrin Goodell Daughter [phone]  [phone]        I met face to face with patient and family in the home. Palliative Care was asked to follow this patient by consultation request of  Venetia Maxon, Rolanda Jay,* to address advance care planning and complex medical decision making. This is a follow up visit.                                   ASSESSMENT AND PLAN / RECOMMENDATIONS:   Advance Care Planning/Goals of Care: Goals include to maximize quality of life and symptom management. Patient/health care surrogate gave his/her permission to discuss. Our advance care planning conversation included a discussion about:    The value and importance of advance care planning  Experiences with loved ones who have been seriously ill or have died  Exploration of personal, cultural or spiritual beliefs that might influence medical decisions  Exploration of goals of care in the event of a sudden injury or illness.  CODE STATUS: DNR  Education provided on Palliative Medicine vs. Hospice services. Daughter wishes for patient to not receive any lab work, attempt to manage in the home if possible. Patient is needing additional caregiver support with bathing. They have requested home health, but some issues with patient insurance. [name] states patient's grand daughter  Barnett Applebaum has insurance information and can provide more information on where they are with situation. NP is awaiting to speak with Barnett Applebaum to follow up regarding [place].   Symptom Management/Plan:  HF-appears euvolemic. Denies shortness of breath, fatigue, no edema. Monitor for worsening symptoms such as worsening shortness of breath, fatigue, weakness, LE edema.     Debility, weakness-patient is needing additional assistance with bathing. They are agreeable to [facility] therapy. It looks as though [place] therapy was ordered, but there are some issue with patient's insurance. Continue to use walker for ambulation. Monitor for falls/safety.   Follow up Palliative Care Visit: Palliative care will continue to follow for complex medical decision making, advance care planning, and clarification of goals. Return prn.   This visit was coded based on medical decision making (MDM).  PPS: 50%  HOSPICE ELIGIBILITY/DIAGNOSIS: TBD  Chief Complaint: Palliative Medicine follow up visit.   HISTORY OF PRESENT ILLNESS:  Melinda Lewis is a 88 y.o. year old female  with chronic CHF, hypertension, CAD, hyperlipidemia, dementia, hypothyroidism, chronic pain syndrome, DDD, OA of right shoulder, diabetes, PVD, anemia, TIA, CDK 3b, NSTEMI. ED visit on 12/13/21 due to AMS, hallucinations.   Patient reports doing well. Daughter states she is stable except for needing assist with bathing. Daughter has issues with her back and is not able to assist patient much with bathing. Patient denies pain, shortness of breath, nausea or constipation. She uses walker for ambulation. No falls reported. No recent infections.  She is sleeping well; sleeping 11-12 hours a night. Appetite is good; no notable weight loss. No cognitive changes reported.   Patient received sitting in living room. Pleasant affect; she answers direct questions. Daughter [name] and neighbor Guerry Minors are present.   History obtained from review of EMR, discussion with primary  team, and interview with family, facility staff/caregiver and/or Ms. Hudock.  I reviewed available labs, medications, imaging, studies and related documents from the EMR.  Records reviewed and summarized above.   ROS   A 10-point ROS is negative, except for the pertinent positives and negatives detailed per the HPI.  Physical Exam: Pulse 64, resp 16, b/p 140/68 Constitutional: NAD General: frail appearing EYES: anicteric sclera, lids intact, no discharge  ENMT: hard of hearing, oral mucous membranes moist CV: S1S2, RRR, no LE edema Pulmonary: LCTA, no increased work of breathing, no cough, room air Abdomen: normo-active BS + 4 quadrants, soft and non tender, no ascites GU: deferred MSK: no sarcopenia, moves all extremities, ambulatory Skin: warm and dry, no rashes or wounds on visible skin Neuro: + generalized weakness Psych: non-anxious affect, A and O x 2 Hem/lymph/immuno: no widespread bruising   Thank you for the opportunity to participate in the care of Ms. Mandato.  The palliative care team will continue to follow. Please call our office at [phone] if we can be of additional assistance.   Ezekiel Slocumb, NP   COVID-19 PATIENT SCREENING TOOL Asked and negative response unless otherwise noted:   Have you had symptoms of covid, tested positive or been in contact with someone with symptoms/positive test in the past 5-10 days? No

## 2022-07-20 ENCOUNTER — Telehealth: Payer: Self-pay | Admitting: Student

## 2022-07-20 NOTE — Telephone Encounter (Signed)
Palliative NP spoke with patient's grand daughter Mackie Pai. Daughter [name], who is POA has given approval to speak with Barnett Applebaum. She is needing assistance with patient's insurance to see if she can get home health therapy approved for assist with bathing. She was also told she can get up to 60 hours with care link. Will refer to palliative SW.

## 2022-07-24 ENCOUNTER — Telehealth: Payer: Self-pay

## 2022-07-24 ENCOUNTER — Other Ambulatory Visit: Payer: Medicare Other

## 2022-07-24 DIAGNOSIS — Z789 Other specified health status: Secondary | ICD-10-CM

## 2022-07-24 NOTE — Progress Notes (Signed)
PC SW connected with patients granddaughter, Melinda Lewis, via telephone.   Granddaughter shared that she has been trying to obtain in home care services for patient to assist with bathing but has not been successful with the service and been told that it is not a benefit under patents insurance.   SW and granddaughter discussed the difference between in home care custodial services (I.e bathing, light house keeping, etc) vs home health services (I.e PT, OT, RN, CNA). Sw advised granddaughter that most times the custodial services are not a covered benefit. Granddaughter shared that patients insurance agent mentioned patient having 60 hours under care link. SW encouraged granddaughter to outreach [facility] and inquire about their services and how to obtain. Otherwise, patient/family would have to acquire a private pay caregiver to assist with bathing. Granddaughter stated understanding and appreciative of clarification.   No other questions/concerns for SW at this time.

## 2022-07-24 NOTE — Telephone Encounter (Signed)
PC SW LVM for patients granddaughter, Barnett Applebaum, per [facility] NP - L. Rivers.  Call unsuccessful./ SW LVM with contact information.

## 2022-09-04 ENCOUNTER — Ambulatory Visit: Payer: Medicare Other | Admitting: Dermatology

## 2022-09-11 ENCOUNTER — Other Ambulatory Visit: Payer: Self-pay | Admitting: Cardiology

## 2022-12-10 ENCOUNTER — Other Ambulatory Visit: Payer: Self-pay | Admitting: Cardiology

## 2023-01-07 ENCOUNTER — Other Ambulatory Visit: Payer: Self-pay | Admitting: Cardiology

## 2023-01-08 ENCOUNTER — Other Ambulatory Visit: Payer: Self-pay | Admitting: Cardiology

## 2023-01-08 NOTE — Telephone Encounter (Signed)
Called the number listed for patient and daughter Melinda Lewis is on DPR she stated that her is out and needs the refills. Explained that I will send to Dr. Swaziland for review due to daughter stating that last visit with Dr. Swaziland she does not need to be see anymore because her heart is doing well. Sent to prescribing doctor for review

## 2023-01-08 NOTE — Telephone Encounter (Signed)
*  STAT* If patient is at the pharmacy, call can be transferred to refill team.   1. Which medications need to be refilled? (please list name of each medication and dose if known) amLODipine (NORVASC) 10 MG tablet   2. Which pharmacy/location (including street and city if local pharmacy) is medication to be sent to? [facility] - [place], [place] - [address]   3. Do they need a 30 day or 90 day supply? 90

## 2023-01-09 MED ORDER — AMLODIPINE BESYLATE 10 MG PO TABS: ORAL_TABLET | ORAL | 0 refills | Status: DC

## 2023-01-09 NOTE — Telephone Encounter (Signed)
Patient's daughter called to schedule the pt at our soonest available appt on May 31st.

## 2023-01-16 NOTE — Telephone Encounter (Signed)
Called patient's daughter Dorann Ou left message on personal voice mail Dr.Jordan advised she can have PCP refill Amlodipine.

## 2023-01-25 MED ORDER — AMLODIPINE BESYLATE 10 MG PO TABS: ORAL_TABLET | ORAL | 1 refills | Status: DC

## 2023-01-25 NOTE — Addendum Note (Signed)
Addended by: Neoma Laming on: 01/25/2023 08:52 AM   Modules accepted: Orders

## 2023-02-15 ENCOUNTER — Ambulatory Visit: Payer: Medicare Other | Admitting: Adult Health

## 2023-03-04 ENCOUNTER — Other Ambulatory Visit: Payer: Self-pay

## 2023-03-04 ENCOUNTER — Emergency Department
Admission: EM | Admit: 2023-03-04 | Discharge: 2023-03-04 | Disposition: A | Discharge status: Home / Self Care | Payer: Medicare Other | Attending: Emergency Medicine | Admitting: Emergency Medicine

## 2023-03-04 ENCOUNTER — Encounter: Payer: Self-pay | Admitting: Emergency Medicine

## 2023-03-04 DIAGNOSIS — N3000 Acute cystitis without hematuria: Secondary | ICD-10-CM | POA: Diagnosis not present

## 2023-03-04 DIAGNOSIS — I110 Hypertensive heart disease with heart failure: Secondary | ICD-10-CM | POA: Diagnosis not present

## 2023-03-04 DIAGNOSIS — E119 Type 2 diabetes mellitus without complications: Secondary | ICD-10-CM | POA: Insufficient documentation

## 2023-03-04 DIAGNOSIS — I2510 Atherosclerotic heart disease of native coronary artery without angina pectoris: Secondary | ICD-10-CM | POA: Insufficient documentation

## 2023-03-04 DIAGNOSIS — R300 Dysuria: Secondary | ICD-10-CM | POA: Diagnosis present

## 2023-03-04 DIAGNOSIS — I509 Heart failure, unspecified: Secondary | ICD-10-CM | POA: Insufficient documentation

## 2023-03-04 LAB — URINALYSIS, ROUTINE W REFLEX MICROSCOPIC
Bilirubin Urine: NEGATIVE
Glucose, UA: 150 mg/dL — AB
Ketones, ur: NEGATIVE mg/dL
Nitrite: NEGATIVE
Protein, ur: 100 mg/dL — AB
Specific Gravity, Urine: 1.015 (ref 1.005–1.030)
pH: 5 (ref 5.0–8.0)

## 2023-03-04 MED ORDER — CEPHALEXIN 500 MG PO CAPS: 500.0000 mg | ORAL_CAPSULE | Freq: Two times a day (BID) | ORAL | 0 refills | Status: AC

## 2023-03-04 NOTE — ED Triage Notes (Signed)
Presents via EMS from home   States she has been having some dysuria for 2 weeks

## 2023-03-04 NOTE — ED Provider Notes (Signed)
[facility] Provider Note    Event Date/Time   First MD Initiated Contact with Patient 03/04/23 1221     (approximate)   History   Chief Complaint Dysuria   HPI  Melinda Lewis is a 89 y.o. female with past medical history of hypertension, diabetes, CAD, CHF, and anemia who presents to the ED complaining of dysuria.  Majority of history is obtained from patient's daughters at bedside, who state that she has been complaining of burning when she urinates for the past 2 days.  Patient denies any pain in her abdomen and has not had any fevers, flank pain, nausea, or vomiting.  Daughter states that she has been acting like her usual self with some mild confusion that is typical for her.     Physical Exam   Triage Vital Signs: ED Triage Vitals [03/04/23 1228]  Enc Vitals Group     BP (!) 149/69     Pulse Rate 89     Resp 18     Temp 98.2 F (36.8 C)     Temp Source Oral     SpO2 94 %     Weight 180 lb (81.6 kg)     Height 5\' 2"  (1.575 m)     Head Circumference      Peak Flow      Pain Score      Pain Loc      Pain Edu?      Excl. in GC?     Most recent vital signs: Vitals:   03/04/23 1228  BP: (!) 149/69  Pulse: 89  Resp: 18  Temp: 98.2 F (36.8 C)  SpO2: 94%    Constitutional: Alert and oriented. Eyes: Conjunctivae are normal. Head: Atraumatic. Nose: No congestion/rhinnorhea. Mouth/Throat: Mucous membranes are moist.  Cardiovascular: Normal rate, regular rhythm. Grossly normal heart sounds.  2+ radial pulses bilaterally. Respiratory: Normal respiratory effort.  No retractions. Lungs CTAB. Gastrointestinal: Soft and nontender. No distention.  No CVA tenderness bilaterally. Musculoskeletal: No lower extremity tenderness nor edema.  Neurologic:  Normal speech and language. No gross focal neurologic deficits are appreciated.    ED Results / Procedures / Treatments   Labs (all labs ordered are listed, but only abnormal results  are displayed) Labs Reviewed  URINALYSIS, ROUTINE W REFLEX MICROSCOPIC - Abnormal; Notable for the following components:      Result Value   Color, Urine YELLOW (*)    APPearance HAZY (*)    Glucose, UA 150 (*)    Hgb urine dipstick SMALL (*)    Protein, ur 100 (*)    Leukocytes,Ua TRACE (*)    Bacteria, UA FEW (*)    All other components within normal limits  URINE CULTURE    PROCEDURES:  Critical Care performed: No  Procedures   MEDICATIONS ORDERED IN ED: Medications - No data to display   IMPRESSION / MDM / ASSESSMENT AND PLAN / ED COURSE  I reviewed the triage vital signs and the nursing notes.                              89 y.o. female with past medical history of hypertension, diabetes, CAD, CHF, and anemia who presents to the ED with 2 days of dysuria.  Patient's presentation is most consistent with acute complicated illness / injury requiring diagnostic workup.  Differential diagnosis includes, but is not limited to, UTI, pyelonephritis, kidney stone.  Patient  well-appearing and in no acute distress, vital signs are unremarkable.  Her abdomen is soft with no focal tenderness, no CVA tenderness noted.  Urinalysis appears consistent with UTI, will send for culture and treat with antibiotics.  Patient appropriate for outpatient management given reassuring vital signs and exam, no concern for sepsis or pyelonephritis at this time.  We will treat with Keflex, daughter was counseled to have her follow-up with her PCP and return to the ED for new or worsening symptoms.  Patient and daughters agree with plan.      FINAL CLINICAL IMPRESSION(S) / ED DIAGNOSES   Final diagnoses:  Acute cystitis without hematuria     Rx / DC Orders   ED Discharge Orders          Ordered    cephALEXin (KEFLEX) 500 MG capsule  2 times daily        03/04/23 1319             Note:  This document was prepared using Dragon voice recognition software and may include  unintentional dictation errors.   Chesley Noon, MD 03/04/23 1345

## 2023-03-05 LAB — URINE CULTURE

## 2023-03-13 ENCOUNTER — Other Ambulatory Visit: Payer: Self-pay | Admitting: Cardiology

## 2023-03-17 NOTE — Progress Notes (Deleted)
Cardiology Clinic Note   Patient Name: Melinda Lewis Date of Encounter: 03/17/2023  Primary Care Provider:  Wilford Corner, PA-C Primary Cardiologist:  Peter Swaziland, MD  Patient Profile    Melinda Lewis is a 89  y.o. female who presents for ongoing assessment and management of coronary artery disease, PAD with known stenosis in the SMA and iliac, Hypertension, type 2 diabetes, and hyperlipidemia.  She has a history of an admission in August 2015 with an NSTEMI and acute respiratory failure with hypertension. Cardiac catheterization December strained to severe disease in the proximal to mid LAD and mid circumflex along with an 80% RCA disease in the mid vessel. She ultimately underwent stenting of the mid circumflex and proximal and mid LAD. Her RCA was managed medically.She had recurrent TIAs in July 2020 with facial droop word salad and confusion. MRI, CT of the head and EEG along with echo showed no acute changes. She was sent home on hospice but no longer qualifies for this service.  Past Medical History    Past Medical History:  Diagnosis Date   Acute on chronic systolic CHF (congestive heart failure) (HCC) 06/16/2014   Anemia    CAD (coronary artery disease) 8/15   s/p DES LCX and LAD    Cardiomyopathy, ischemic 06/16/2014   Diabetes mellitus without complication (HCC)    Hypertension    NSTEMI (non-ST elevated myocardial infarction) (HCC)    Thyroid disease    TIA (transient ischemic attack) 2010   Past Surgical History:  Procedure Laterality Date   LEFT HEART CATHETERIZATION WITH CORONARY ANGIOGRAM N/A 05/03/2014   Procedure: LEFT HEART CATHETERIZATION WITH CORONARY ANGIOGRAM;  Surgeon: Peter M Swaziland, MD;  Location: [facility] CATH LAB;  Service: Cardiovascular;  Laterality: N/A;   PERCUTANEOUS CORONARY STENT INTERVENTION (PCI-S) Right 05/03/2014   Procedure: PERCUTANEOUS CORONARY STENT INTERVENTION (PCI-S);  Surgeon: Peter M Swaziland, MD;  Location: [facility] CATH LAB;  Service:  Cardiovascular;  Laterality: Right;   PERCUTANEOUS CORONARY STENT INTERVENTION (PCI-S) N/A 05/04/2014   Procedure: PERCUTANEOUS CORONARY STENT INTERVENTION (PCI-S);  Surgeon: Peter M Swaziland, MD;  Location: [facility] CATH LAB;  Service: Cardiovascular;  Laterality: N/A;    Allergies  Allergies  Allergen Reactions   Clonidine Derivatives Other (See Comments)    MADE THE PATIENT PASS OUT   Cefuroxime Axetil Nausea Only and Other (See Comments)    Cramps, also   Septra [Sulfamethoxazole-Trimethoprim] Nausea And Vomiting   Whiskey [Alcohol] Hives   Epinephrine Palpitations and Other (See Comments)    Heart races   Latex Rash and Other (See Comments)    Causes sores on the skin   Penicillins Palpitations    Heart races Did it involve swelling of the face/tongue/throat, SOB, or low BP? Unk Did it involve sudden or severe rash/hives, skin peeling, or any reaction on the inside of your mouth or nose? Unk Did you need to seek medical attention at a hospital or doctor's office? Yes When did it last happen? "During [employer]" (1940's) If all above answers are "NO", may proceed with cephalosporin use.    Tape Rash and Other (See Comments)    Latex tape causes sores on the skin    History of Present Illness    Melinda Lewis returns today for ongoing assessment and medical management of CAD, HTN, with hx of TIA, failure to thrive. She was referred for [facility] and recommended for Palliative Care on last office visit on 12/21/2021. Amlodipine was increased to 10 mg daily due to  uncontrolled HTN. Seen in the ED for UTI on 03/04/2023 and is being treated with Keflex.  Home Medications    Current Outpatient Medications  Medication Sig Dispense Refill   acetaminophen (TYLENOL) 500 MG tablet Take 500 mg by mouth 3 (three) times daily.     amLODipine (NORVASC) 10 MG tablet TAKE ONE TABLET (10 MG) BY MOUTH EVERY DAY - SCHEDULE OFFICE VISIT FOR REFILLS 30 tablet 1   Digestive Enzymes (ENZYME DIGEST) CAPS Take by mouth.      escitalopram (LEXAPRO) 5 MG tablet Take by mouth.     Flaxseed, Linseed, (BIO-FLAX) 1000 MG CAPS Take 1,000 mg by mouth daily at 10 pm.     hydrALAZINE (APRESOLINE) 50 MG tablet Take 50 mg by mouth.     hydrocortisone 2.5 % ointment Apply to affected area rash on left cheek twice a day for up to 2 weeks. 30 g 0   levETIRAcetam (KEPPRA) 500 MG tablet Take 1 tablet (500 mg total) by mouth 2 (two) times daily. 60 tablet 0   levothyroxine (SYNTHROID) 150 MCG tablet Take 150 mcg by mouth every morning.     loratadine (CLARITIN) 10 MG tablet Take 10 mg by mouth daily at 6 PM.     magnesium oxide (MAG-OX) 400 MG tablet Take 400 mg by mouth 2 (two) times daily.  (Patient not taking: Reported on 02/20/2022)     Melatonin 5 MG TABS Take 5 mg by mouth at bedtime.  (Patient not taking: Reported on 02/20/2022)     metFORMIN (GLUCOPHAGE) 500 MG tablet Take 500 mg by mouth 2 (two) times daily. (Patient not taking: Reported on 02/20/2022)     metoprolol succinate (TOPROL-XL) 25 MG 24 hr tablet TAKE ONE TABLET BY MOUTH EVERY DAY 90 tablet 3   nitroGLYCERIN (NITROSTAT) 0.4 MG SL tablet Place 1 tablet (0.4 mg total) under the tongue every 5 (five) minutes as needed for chest pain. 90 tablet 3   ondansetron (ZOFRAN ODT) 4 MG disintegrating tablet Take 1 tablet (4 mg total) by mouth every 8 (eight) hours as needed for nausea or vomiting. 20 tablet 0   polyethylene glycol (MIRALAX / GLYCOLAX) 17 g packet Take 17 g by mouth daily as needed for mild constipation or moderate constipation. 14 each 0   QUEtiapine (SEROQUEL) 25 MG tablet Take 25 mg by mouth at bedtime.      No current facility-administered medications for this visit.     Family History    No family history on file. She indicated that her mother is deceased. She indicated that her father is deceased. She indicated that her maternal grandmother is deceased. She indicated that her maternal grandfather is deceased. She indicated that her paternal grandmother is  deceased. She indicated that her paternal grandfather is deceased.  Social History    Social History   Socioeconomic History   Marital status: Widowed    Spouse name: Not on file   Number of children: Not on file   Years of education: Not on file   Highest education level: Not on file  Occupational History   Not on file  Tobacco Use   Smoking status: Former    Types: Cigarettes   Smokeless tobacco: Never  Vaping Use   Vaping Use: Never used  Substance and Sexual Activity   Alcohol use: No   Drug use: No   Sexual activity: Not Currently    Birth control/protection: Post-menopausal  Other Topics Concern   Not on file  Social History  Narrative   Not on file   Social Determinants of Health   Financial Resource Strain: Not on file  Food Insecurity: Not on file  Transportation Needs: Not on file  Physical Activity: Not on file  Stress: Not on file  Social Connections: Not on file  Intimate Partner Violence: Not on file     Review of Systems    General:  No chills, fever, night sweats or weight changes.  Cardiovascular:  No chest pain, dyspnea on exertion, edema, orthopnea, palpitations, paroxysmal nocturnal dyspnea. Dermatological: No rash, lesions/masses Respiratory: No cough, dyspnea Urologic: No hematuria, dysuria Abdominal:   No nausea, vomiting, diarrhea, bright red blood per rectum, melena, or hematemesis Neurologic:  No visual changes, wkns, changes in mental status. All other systems reviewed and are otherwise negative except as noted above.       Physical Exam    VS:  There were no vitals taken for this visit. , BMI There is no height or weight on file to calculate BMI.     GEN: Well nourished, well developed, in no acute distress. HEENT: normal. Neck: Supple, no JVD, carotid bruits, or masses. Cardiac: RRR, no murmurs, rubs, or gallops. No clubbing, cyanosis, edema.  Radials/DP/PT 2+ and equal bilaterally.  Respiratory:  Respirations regular and  unlabored, clear to auscultation bilaterally. GI: Soft, nontender, nondistended, BS + x 4. MS: no deformity or atrophy. Skin: warm and dry, no rash. Neuro:  Strength and sensation are intact. Psych: Normal affect.      Lab Results  Component Value Date   WBC 11.9 (H) 12/13/2021   HGB 12.3 12/13/2021   HCT 38.7 12/13/2021   MCV 96.0 12/13/2021   PLT 310 12/13/2021   Lab Results  Component Value Date   CREATININE 1.08 (H) 12/13/2021   BUN 25 (H) 12/13/2021   NA 134 (L) 12/13/2021   K 5.1 12/13/2021   CL 99 12/13/2021   CO2 28 12/13/2021   Lab Results  Component Value Date   ALT 18 12/13/2021   AST 35 12/13/2021   ALKPHOS 43 12/13/2021   BILITOT 1.1 12/13/2021   Lab Results  Component Value Date   CHOL 107 03/22/2019   HDL 33 (L) 03/22/2019   LDLCALC 47 03/22/2019   TRIG 135 03/22/2019   CHOLHDL 3.2 03/22/2019    Lab Results  Component Value Date   HGBA1C 7.4 (H) 03/21/2019     Review of Prior Studies    Echocardiogram 03/22/2019 1. The left ventricle has normal systolic function with an ejection  fraction of 60-65%. The cavity size was normal. There is mildly increased  left ventricular wall thickness. Left ventricular diastolic Doppler  parameters are consistent with impaired  relaxation.   2. The right ventricle has normal systolic function. The cavity was  normal.   3. The mitral valve is grossly normal. There is mild mitral annular  calcification present.   4. The tricuspid valve is grossly normal.   5. The aortic valve is tricuspid. Mild thickening of the aortic valve. No  stenosis of the aortic valve.   6. Normal LV systolic function; mild LVH; mild diastolic dysfunction.     Assessment & Plan   1.  ***     {Are you ordering a CV Procedure (e.g. stress test, cath, DCCV, TEE, etc)?   Press F2        :[id number]}   Signed, Bettey Mare. Liborio Nixon, ANP, AACC   03/17/2023 12:28 PM      Office [phone]  Fax [phone]  Notice: This  dictation was prepared with Dragon dictation along with smaller phrase technology. Any transcriptional errors that result from this process are unintentional and may not be corrected upon review.

## 2023-03-19 ENCOUNTER — Ambulatory Visit: Payer: Medicare Other | Attending: Adult Health | Admitting: Adult Health

## 2023-04-11 ENCOUNTER — Other Ambulatory Visit: Payer: Self-pay | Admitting: Cardiology

## 2023-05-14 ENCOUNTER — Other Ambulatory Visit: Payer: Self-pay | Admitting: Cardiology

## 2023-08-05 ENCOUNTER — Ambulatory Visit
Admission: EM | Admit: 2023-08-05 | Discharge: 2023-08-05 | Disposition: A | Discharge status: Home / Self Care | Payer: Medicare Other | Attending: Family Medicine | Admitting: Family Medicine

## 2023-08-05 ENCOUNTER — Encounter: Payer: Self-pay | Admitting: *Deleted

## 2023-08-05 DIAGNOSIS — R112 Nausea with vomiting, unspecified: Secondary | ICD-10-CM | POA: Diagnosis not present

## 2023-08-05 MED ORDER — ONDANSETRON 4 MG PO TBDP: 4.0000 mg | ORAL_TABLET | Freq: Three times a day (TID) | ORAL | 0 refills | Status: DC | PRN

## 2023-08-05 MED ORDER — ONDANSETRON 4 MG PO TBDP
4.0000 mg | ORAL_TABLET | Freq: Once | ORAL | Status: AC
  Administered 2023-08-05: 4 mg via ORAL

## 2023-08-05 NOTE — ED Provider Notes (Addendum)
Renaldo Fiddler    CSN: [id number] Arrival date & time: 08/05/23  1602      History   Chief Complaint Chief Complaint  Patient presents with   Nausea   Vomiting   Diarrhea    HPI Melinda Lewis is a 89 y.o. female.    Diarrhea Here for vomiting and diarrhea that happened this afternoon.  Since August she has been having vomiting and diarrhea and nausea that happens 2-3 times a week.  Sometimes it is seems that it has been after she has eaten something with tomatoes or tomato sauce in it.  No fever associated with it.  No blood in the stool or the emesis.  She is just had 1 episode of vomiting and 1 episode of diarrhea today.  No abdominal pain really, and no weight loss.  She was supposed to be evaluated by her primary care recently, but the patient refused to leave the house and so the family had to cancel the appointment.  They do now have hospice coming for an evaluation here in a couple of days.    Past Medical History:  Diagnosis Date   Acute on chronic systolic CHF (congestive heart failure) (HCC) 06/16/2014   Anemia    CAD (coronary artery disease) 8/15   s/p DES LCX and LAD    Cardiomyopathy, ischemic 06/16/2014   Diabetes mellitus without complication (HCC)    Hypertension    NSTEMI (non-ST elevated myocardial infarction) [facility])    Thyroid disease    TIA (transient ischemic attack) 2010    Patient Active Problem List   Diagnosis Date Noted   Dementia without behavioral disturbance (HCC)    Goals of care, counseling/discussion    Palliative care encounter    Delirium 03/25/2019   Expressive dysphasia    Altered mental status    TIA (transient ischemic attack) 03/21/2019   AKI (acute kidney injury) (HCC) 09/05/2018   Diabetes mellitus without complication (HCC) 10/22/2017   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   Aortic atherosclerosis (HCC) 10/22/2017   Carotid disease, bilateral (HCC) 10/22/2017   Uncontrolled  hypertension 09/09/2017   Nausea & vomiting 09/08/2017   Chronic systolic CHF (congestive heart failure) (HCC) 03/04/2015   Acute on chronic systolic CHF (congestive heart failure) (HCC) 06/16/2014   Cardiomyopathy, ischemic 06/16/2014   CAD, multiple vessel 06/15/2014   Anemia 05/05/2014   Hypothyroidism 05/05/2014   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/05/2014   NSTEMI (non-ST elevated myocardial infarction) (HCC) 05/02/2014   Hypertension 05/02/2014   Pulmonary edema cardiac cause (HCC) 05/02/2014    Past Surgical History:  Procedure Laterality Date   LEFT HEART CATHETERIZATION WITH CORONARY ANGIOGRAM N/A 05/03/2014   Procedure: LEFT HEART CATHETERIZATION WITH CORONARY ANGIOGRAM;  Surgeon: Peter M Swaziland, MD;  Location: [facility] CATH LAB;  Service: Cardiovascular;  Laterality: N/A;   PERCUTANEOUS CORONARY STENT INTERVENTION (PCI-S) Right 05/03/2014   Procedure: PERCUTANEOUS CORONARY STENT INTERVENTION (PCI-S);  Surgeon: Peter M Swaziland, MD;  Location: [facility] CATH LAB;  Service: Cardiovascular;  Laterality: Right;   PERCUTANEOUS CORONARY STENT INTERVENTION (PCI-S) N/A 05/04/2014   Procedure: PERCUTANEOUS CORONARY STENT INTERVENTION (PCI-S);  Surgeon: Peter M Swaziland, MD;  Location: [facility] CATH LAB;  Service: Cardiovascular;  Laterality: N/A;    OB History   No obstetric history on file.      Home Medications    Prior to Admission medications   Medication Sig Start Date End Date Taking? Authorizing Provider  acetaminophen (TYLENOL) 500 MG tablet Take 500 mg  by mouth 3 (three) times daily.    [provider]  amLODipine (NORVASC) 10 MG tablet TAKE ONE TABLET BY MOUTH EVERY DAY. **PLEASE SCHEDULE OFFICE VISIT FOR FUTURE REFILLS**3RD AND FINAL ATTEMPT 05/14/23   [place], Peter M, MD  Digestive Enzymes (ENZYME DIGEST) CAPS Take by mouth.    [provider]  escitalopram (LEXAPRO) 5 MG tablet Take by mouth. 12/12/20 07/17/22  [provider]  Flaxseed, Linseed, (BIO-FLAX)  1000 MG CAPS Take 1,000 mg by mouth daily at 10 pm.    [provider]  hydrALAZINE (APRESOLINE) 50 MG tablet Take 50 mg by mouth. 10/27/20 07/17/22  [provider]  hydrocortisone 2.5 % ointment Apply to affected area rash on left cheek twice a day for up to 2 weeks. 06/19/22   Willeen Niece, MD  levETIRAcetam (KEPPRA) 500 MG tablet Take 1 tablet (500 mg total) by mouth 2 (two) times daily. 03/27/19   Marthenia Rolling, DO  levothyroxine (SYNTHROID) 150 MCG tablet Take 150 mcg by mouth every morning. 12/14/20   [provider]  loratadine (CLARITIN) 10 MG tablet Take 10 mg by mouth daily at 6 PM.    [provider]  magnesium oxide (MAG-OX) 400 MG tablet Take 400 mg by mouth 2 (two) times daily.  Patient not taking: Reported on 02/20/2022    [provider]  Melatonin 5 MG TABS Take 5 mg by mouth at bedtime.  Patient not taking: Reported on 02/20/2022    [provider]  metFORMIN (GLUCOPHAGE) 500 MG tablet Take 500 mg by mouth 2 (two) times daily. Patient not taking: Reported on 02/20/2022 01/18/20   [provider]  metoprolol succinate (TOPROL-XL) 25 MG 24 hr tablet TAKE ONE TABLET BY MOUTH EVERY DAY 12/09/18   Azalee Course, PA  nitroGLYCERIN (NITROSTAT) 0.4 MG SL tablet Place 1 tablet (0.4 mg total) under the tongue every 5 (five) minutes as needed for chest pain. 11/25/17 01/12/20  [place], Peter M, MD  ondansetron (ZOFRAN ODT) 4 MG disintegrating tablet Take 1 tablet (4 mg total) by mouth every 8 (eight) hours as needed for nausea or vomiting. 08/05/23   Zenia Resides, MD  polyethylene glycol (MIRALAX / GLYCOLAX) 17 g packet Take 17 g by mouth daily as needed for mild constipation or moderate constipation. 03/24/19   Jackelyn Poling, DO  QUEtiapine (SEROQUEL) 25 MG tablet Take 25 mg by mouth at bedtime.  12/17/16   [provider]    Family History History reviewed. No pertinent family history.  Social History Social History   Tobacco  Use   Smoking status: Former    Types: Cigarettes   Smokeless tobacco: Never  Vaping Use   Vaping status: Never Used  Substance Use Topics   Alcohol use: No   Drug use: No     Allergies   Clonidine derivatives, Cefuroxime axetil, Septra [sulfamethoxazole-trimethoprim], Whiskey [alcohol], Epinephrine, Latex, Penicillins, and Tape   Review of Systems Review of Systems  Gastrointestinal:  Positive for diarrhea.     Physical Exam Triage Vital Signs ED Triage Vitals  Encounter Vitals Group     BP 08/05/23 1710 (!) 149/56     Systolic BP Percentile --      Diastolic BP Percentile --      Pulse Rate 08/05/23 1710 80     Resp 08/05/23 1710 20     Temp 08/05/23 1710 98.8 F (37.1 C)     Temp Source 08/05/23 1710 Oral     SpO2 08/05/23 1710 (!)  88 %     Weight 08/05/23 1706 177 lb (80.3 kg)     Height 08/05/23 1706 5' (1.524 m)     Head Circumference --      Peak Flow --      Pain Score 08/05/23 1706 0     Pain Loc --      Pain Education --      Exclude from Growth Chart --    No data found.  Updated Vital Signs BP (!) 149/56 (BP Location: Left Arm)   Pulse 80   Temp 98.8 F (37.1 C) (Oral)   Resp 20   Ht 5' (1.524 m)   Wt 80.3 kg   SpO2 (!) 88% Comment: provider notified, checked x2 w/ different devices  BMI 34.57 kg/m   Visual Acuity Right Eye Distance:   Left Eye Distance:   Bilateral Distance:    Right Eye Near:   Left Eye Near:    Bilateral Near:     Physical Exam Vitals reviewed.  Constitutional:      General: She is not in acute distress.    Appearance: She is not toxic-appearing.     Comments: She is in no acute distress and responds appropriately to questions, though she is hard of hearing.  HENT:     Nose: Nose normal.     Mouth/Throat:     Mouth: Mucous membranes are moist.     Pharynx: No oropharyngeal exudate or posterior oropharyngeal erythema.  Eyes:     Extraocular Movements: Extraocular movements intact.     Conjunctiva/sclera:  Conjunctivae normal.     Pupils: Pupils are equal, round, and reactive to light.  Cardiovascular:     Rate and Rhythm: Normal rate and regular rhythm.     Heart sounds: No murmur heard. Pulmonary:     Effort: Pulmonary effort is normal. No respiratory distress.     Breath sounds: No stridor. No wheezing, rhonchi or rales.  Chest:     Chest wall: No tenderness.  Abdominal:     General: There is no distension.     Palpations: Abdomen is soft.     Tenderness: There is no abdominal tenderness.  Musculoskeletal:     Cervical back: Neck supple.  Lymphadenopathy:     Cervical: No cervical adenopathy.  Skin:    Capillary Refill: Capillary refill takes less than 2 seconds.     Coloration: Skin is not jaundiced or pale.  Neurological:     General: No focal deficit present.     Mental Status: She is alert and oriented to person, place, and time.  Psychiatric:        Behavior: Behavior normal.      UC Treatments / Results  Labs (all labs ordered are listed, but only abnormal results are displayed) Labs Reviewed  COMPREHENSIVE METABOLIC PANEL  CBC    EKG   Radiology No results found.  Procedures Procedures (including critical care time)  Medications Ordered in UC Medications  ondansetron (ZOFRAN-ODT) disintegrating tablet 4 mg (has no administration in time range)    Initial Impression / Assessment and Plan / UC Course  I have reviewed the triage vital signs and the nursing notes.  Pertinent labs & imaging results that were available during my care of the patient were reviewed by me and considered in my medical decision making (see chart for details).      She is in no acute distress and other than her low O2 sat on room air, her vitals  are acceptable.  She is not tachycardic and her temperature is normal.  Blood pressure is 149/56.  I discussed considering going to the emergency room with the patient this evening with the family, but with her being 65 we are not  sure that that would be of any value to her.  CBC and CMP are drawn today and we will let her know if there is anything significantly abnormal.  Zofran is given here and 1 dose and Zofran is sent to the pharmacy for as needed use in the short-term.  If she feels any worse or looks any worse to the family, they can certainly take her to the emergency room later.  I do think hospice evaluation and comfort care is a good idea in this patient.  Staff was unable to obtain venous access for lab draw.  Those orders will be canceled. Final Clinical Impressions(s) / UC Diagnoses   Final diagnoses:  Nausea and vomiting, unspecified vomiting type     Discharge Instructions      Ondansetron dissolved in the mouth every 8 hours as needed for nausea or vomiting.  We have given you 1 dose of this medication here in the office.  We have drawn blood to check your blood counts and electrolytes and kidney and liver function and glucose.  Staff will notify you if there is anything significantly abnormal.  I am glad that you are getting hospice and palliative care involved    ED Prescriptions     Medication Sig Dispense Auth. Provider   ondansetron (ZOFRAN ODT) 4 MG disintegrating tablet Take 1 tablet (4 mg total) by mouth every 8 (eight) hours as needed for nausea or vomiting. 10 tablet Marlinda Mike Janace Aris, MD      PDMP not reviewed this encounter.   Zenia Resides, MD 08/05/23 1739    Zenia Resides, MD 08/05/23 [phone]

## 2023-08-05 NOTE — Discharge Instructions (Signed)
Ondansetron dissolved in the mouth every 8 hours as needed for nausea or vomiting.  We have given you 1 dose of this medication here in the office.  We have drawn blood to check your blood counts and electrolytes and kidney and liver function and glucose.  Staff will notify you if there is anything significantly abnormal.  I am glad that you are getting hospice and palliative care involved

## 2023-08-05 NOTE — ED Triage Notes (Signed)
Family states patient nauseous since Aug 2024, V/D started today

## 2024-01-16 DEATH — deceased

## 2024-01-20 ENCOUNTER — Telehealth: Payer: Self-pay | Admitting: Cardiology

## 2024-01-20 NOTE — Telephone Encounter (Signed)
 Jearld Min, Maricela Shoe, NT  You9 minutes ago (3:26 PM)    Received, obituary located and verified   will mark patient deceased

## 2024-01-20 NOTE — Telephone Encounter (Signed)
  Pt's daughter said, pt passed away on 17-Jan-2024
